# Patient Record
Sex: Female | Born: 1935 | Race: White | Hispanic: No | Smoking: Never smoker
Health system: Southern US, Community
[De-identification: ages and names within clinical notes are randomized; demographics above are authoritative.]

## PROBLEM LIST (undated history)

## (undated) DIAGNOSIS — R600 Localized edema: Secondary | ICD-10-CM

## (undated) DIAGNOSIS — R269 Unspecified abnormalities of gait and mobility: Secondary | ICD-10-CM

## (undated) DIAGNOSIS — I639 Cerebral infarction, unspecified: Secondary | ICD-10-CM

## (undated) DIAGNOSIS — G43909 Migraine, unspecified, not intractable, without status migrainosus: Secondary | ICD-10-CM

## (undated) DIAGNOSIS — M1990 Unspecified osteoarthritis, unspecified site: Secondary | ICD-10-CM

## (undated) DIAGNOSIS — K2270 Barrett's esophagus without dysplasia: Secondary | ICD-10-CM

## (undated) DIAGNOSIS — R197 Diarrhea, unspecified: Secondary | ICD-10-CM

## (undated) DIAGNOSIS — I4891 Unspecified atrial fibrillation: Secondary | ICD-10-CM

## (undated) DIAGNOSIS — R51 Headache: Secondary | ICD-10-CM

## (undated) DIAGNOSIS — J189 Pneumonia, unspecified organism: Secondary | ICD-10-CM

## (undated) DIAGNOSIS — K219 Gastro-esophageal reflux disease without esophagitis: Secondary | ICD-10-CM

## (undated) DIAGNOSIS — I10 Essential (primary) hypertension: Secondary | ICD-10-CM

## (undated) DIAGNOSIS — M8580 Other specified disorders of bone density and structure, unspecified site: Secondary | ICD-10-CM

## (undated) DIAGNOSIS — J42 Unspecified chronic bronchitis: Secondary | ICD-10-CM

## (undated) HISTORY — DX: Essential (primary) hypertension: I10

## (undated) HISTORY — DX: Unspecified abnormalities of gait and mobility: R26.9

## (undated) HISTORY — DX: Unspecified osteoarthritis, unspecified site: M19.90

## (undated) HISTORY — DX: Diarrhea, unspecified: R19.7

## (undated) HISTORY — DX: Cerebral infarction, unspecified: I63.9

## (undated) HISTORY — DX: Unspecified atrial fibrillation: I48.91

## (undated) HISTORY — DX: Localized edema: R60.0

## (undated) HISTORY — PX: OTHER SURGICAL HISTORY: SHX169

## (undated) HISTORY — PX: CHOLECYSTECTOMY: SHX55

## (undated) HISTORY — DX: Barrett's esophagus without dysplasia: K22.70

## (undated) HISTORY — DX: Other specified disorders of bone density and structure, unspecified site: M85.80

---
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
## 1997-09-21 ENCOUNTER — Ambulatory Visit (HOSPITAL_COMMUNITY): Admission: RE | Admit: 1997-09-21 | Discharge: 1997-09-21 | Payer: Self-pay | Admitting: Family Medicine

## 1998-09-23 ENCOUNTER — Ambulatory Visit (HOSPITAL_COMMUNITY): Admission: RE | Admit: 1998-09-23 | Discharge: 1998-09-23 | Payer: Self-pay | Admitting: Family Medicine

## 1999-03-04 ENCOUNTER — Encounter: Payer: Self-pay | Admitting: Gastroenterology

## 1999-03-04 ENCOUNTER — Ambulatory Visit (HOSPITAL_COMMUNITY): Admission: RE | Admit: 1999-03-04 | Discharge: 1999-03-04 | Payer: Self-pay | Admitting: Gastroenterology

## 1999-09-26 ENCOUNTER — Ambulatory Visit (HOSPITAL_COMMUNITY): Admission: RE | Admit: 1999-09-26 | Discharge: 1999-09-26 | Payer: Self-pay | Admitting: Family Medicine

## 2000-09-27 ENCOUNTER — Ambulatory Visit (HOSPITAL_COMMUNITY): Admission: RE | Admit: 2000-09-27 | Discharge: 2000-09-27 | Payer: Self-pay | Admitting: Family Medicine

## 2001-02-08 ENCOUNTER — Inpatient Hospital Stay (HOSPITAL_COMMUNITY): Admission: EM | Admit: 2001-02-08 | Discharge: 2001-02-11 | Payer: Self-pay | Admitting: Emergency Medicine

## 2001-04-09 ENCOUNTER — Ambulatory Visit: Admission: RE | Admit: 2001-04-09 | Discharge: 2001-04-09 | Payer: Self-pay | Admitting: Pulmonary Disease

## 2001-10-01 ENCOUNTER — Encounter: Payer: Self-pay | Admitting: Internal Medicine

## 2001-10-01 ENCOUNTER — Ambulatory Visit (HOSPITAL_COMMUNITY): Admission: RE | Admit: 2001-10-01 | Discharge: 2001-10-01 | Payer: Self-pay | Admitting: Internal Medicine

## 2002-05-23 ENCOUNTER — Encounter: Admission: RE | Admit: 2002-05-23 | Discharge: 2002-05-23 | Payer: Self-pay | Admitting: Internal Medicine

## 2002-05-23 ENCOUNTER — Encounter: Payer: Self-pay | Admitting: Internal Medicine

## 2002-10-07 ENCOUNTER — Ambulatory Visit (HOSPITAL_COMMUNITY): Admission: RE | Admit: 2002-10-07 | Discharge: 2002-10-07 | Payer: Self-pay | Admitting: Internal Medicine

## 2002-10-07 ENCOUNTER — Encounter: Payer: Self-pay | Admitting: Internal Medicine

## 2002-10-09 ENCOUNTER — Encounter: Admission: RE | Admit: 2002-10-09 | Discharge: 2002-10-09 | Payer: Self-pay | Admitting: Family Medicine

## 2002-10-09 ENCOUNTER — Encounter: Payer: Self-pay | Admitting: Family Medicine

## 2003-07-15 ENCOUNTER — Encounter: Admission: RE | Admit: 2003-07-15 | Discharge: 2003-07-15 | Payer: Self-pay | Admitting: Internal Medicine

## 2003-10-08 ENCOUNTER — Ambulatory Visit (HOSPITAL_COMMUNITY): Admission: RE | Admit: 2003-10-08 | Discharge: 2003-10-08 | Payer: Self-pay | Admitting: Internal Medicine

## 2004-07-12 ENCOUNTER — Ambulatory Visit: Payer: Self-pay | Admitting: Internal Medicine

## 2004-08-08 ENCOUNTER — Ambulatory Visit (HOSPITAL_COMMUNITY): Admission: RE | Admit: 2004-08-08 | Discharge: 2004-08-08 | Payer: Self-pay | Admitting: Internal Medicine

## 2004-08-18 ENCOUNTER — Encounter: Admission: RE | Admit: 2004-08-18 | Discharge: 2004-08-18 | Payer: Self-pay | Admitting: Internal Medicine

## 2005-03-22 ENCOUNTER — Ambulatory Visit: Payer: Self-pay | Admitting: Internal Medicine

## 2005-03-24 ENCOUNTER — Ambulatory Visit: Payer: Self-pay | Admitting: Internal Medicine

## 2005-04-11 ENCOUNTER — Ambulatory Visit: Payer: Self-pay | Admitting: Gastroenterology

## 2005-04-17 ENCOUNTER — Ambulatory Visit (HOSPITAL_COMMUNITY): Admission: RE | Admit: 2005-04-17 | Discharge: 2005-04-17 | Payer: Self-pay | Admitting: Gastroenterology

## 2005-05-02 ENCOUNTER — Ambulatory Visit: Payer: Self-pay | Admitting: Gastroenterology

## 2005-05-02 ENCOUNTER — Encounter (INDEPENDENT_AMBULATORY_CARE_PROVIDER_SITE_OTHER): Payer: Self-pay | Admitting: Specialist

## 2005-05-11 ENCOUNTER — Ambulatory Visit (HOSPITAL_COMMUNITY): Admission: RE | Admit: 2005-05-11 | Discharge: 2005-05-11 | Payer: Self-pay | Admitting: Gastroenterology

## 2005-06-15 ENCOUNTER — Ambulatory Visit: Payer: Self-pay | Admitting: Gastroenterology

## 2005-08-25 ENCOUNTER — Ambulatory Visit (HOSPITAL_COMMUNITY): Admission: RE | Admit: 2005-08-25 | Discharge: 2005-08-25 | Payer: Self-pay | Admitting: Internal Medicine

## 2005-12-08 ENCOUNTER — Ambulatory Visit: Payer: Self-pay | Admitting: Internal Medicine

## 2006-08-28 ENCOUNTER — Ambulatory Visit (HOSPITAL_COMMUNITY): Admission: RE | Admit: 2006-08-28 | Discharge: 2006-08-28 | Payer: Self-pay | Admitting: Internal Medicine

## 2006-10-14 ENCOUNTER — Emergency Department (HOSPITAL_COMMUNITY): Admission: EM | Admit: 2006-10-14 | Discharge: 2006-10-14 | Payer: Self-pay | Admitting: Emergency Medicine

## 2006-11-18 ENCOUNTER — Emergency Department (HOSPITAL_COMMUNITY): Admission: EM | Admit: 2006-11-18 | Discharge: 2006-11-18 | Payer: Self-pay | Admitting: Family Medicine

## 2006-11-21 ENCOUNTER — Telehealth (INDEPENDENT_AMBULATORY_CARE_PROVIDER_SITE_OTHER): Payer: Self-pay | Admitting: *Deleted

## 2006-11-23 ENCOUNTER — Telehealth: Payer: Self-pay | Admitting: Internal Medicine

## 2006-11-23 ENCOUNTER — Ambulatory Visit: Payer: Self-pay | Admitting: Internal Medicine

## 2006-11-23 DIAGNOSIS — I10 Essential (primary) hypertension: Secondary | ICD-10-CM | POA: Insufficient documentation

## 2006-12-12 ENCOUNTER — Ambulatory Visit: Payer: Self-pay | Admitting: Internal Medicine

## 2007-01-23 ENCOUNTER — Ambulatory Visit: Payer: Self-pay | Admitting: Internal Medicine

## 2007-01-23 DIAGNOSIS — K2270 Barrett's esophagus without dysplasia: Secondary | ICD-10-CM | POA: Insufficient documentation

## 2007-02-25 ENCOUNTER — Telehealth (INDEPENDENT_AMBULATORY_CARE_PROVIDER_SITE_OTHER): Payer: Self-pay | Admitting: *Deleted

## 2007-03-23 ENCOUNTER — Emergency Department (HOSPITAL_COMMUNITY): Admission: EM | Admit: 2007-03-23 | Discharge: 2007-03-23 | Payer: Self-pay | Admitting: Family Medicine

## 2007-04-26 ENCOUNTER — Ambulatory Visit: Payer: Self-pay | Admitting: Internal Medicine

## 2007-04-26 DIAGNOSIS — R32 Unspecified urinary incontinence: Secondary | ICD-10-CM | POA: Insufficient documentation

## 2007-04-26 DIAGNOSIS — R269 Unspecified abnormalities of gait and mobility: Secondary | ICD-10-CM | POA: Insufficient documentation

## 2007-04-26 DIAGNOSIS — R197 Diarrhea, unspecified: Secondary | ICD-10-CM | POA: Insufficient documentation

## 2007-04-26 DIAGNOSIS — M899 Disorder of bone, unspecified: Secondary | ICD-10-CM | POA: Insufficient documentation

## 2007-04-26 DIAGNOSIS — M949 Disorder of cartilage, unspecified: Secondary | ICD-10-CM

## 2007-05-08 ENCOUNTER — Telehealth: Payer: Self-pay | Admitting: Internal Medicine

## 2007-05-14 ENCOUNTER — Encounter: Payer: Self-pay | Admitting: Internal Medicine

## 2007-05-14 ENCOUNTER — Ambulatory Visit: Payer: Self-pay | Admitting: Internal Medicine

## 2007-06-18 ENCOUNTER — Telehealth (INDEPENDENT_AMBULATORY_CARE_PROVIDER_SITE_OTHER): Payer: Self-pay | Admitting: *Deleted

## 2007-06-27 ENCOUNTER — Ambulatory Visit: Payer: Self-pay | Admitting: Internal Medicine

## 2007-07-30 ENCOUNTER — Inpatient Hospital Stay (HOSPITAL_COMMUNITY): Admission: EM | Admit: 2007-07-30 | Discharge: 2007-08-05 | Payer: Self-pay | Admitting: Emergency Medicine

## 2007-07-30 ENCOUNTER — Ambulatory Visit: Payer: Self-pay | Admitting: Internal Medicine

## 2007-07-30 ENCOUNTER — Ambulatory Visit: Payer: Self-pay | Admitting: Cardiology

## 2007-07-31 ENCOUNTER — Encounter: Payer: Self-pay | Admitting: Internal Medicine

## 2007-08-06 ENCOUNTER — Telehealth: Payer: Self-pay | Admitting: Internal Medicine

## 2007-08-07 ENCOUNTER — Encounter: Payer: Self-pay | Admitting: Internal Medicine

## 2007-08-08 ENCOUNTER — Encounter: Payer: Self-pay | Admitting: Internal Medicine

## 2007-08-13 ENCOUNTER — Ambulatory Visit: Payer: Self-pay | Admitting: Internal Medicine

## 2007-08-13 DIAGNOSIS — I4891 Unspecified atrial fibrillation: Secondary | ICD-10-CM

## 2007-08-13 DIAGNOSIS — R609 Edema, unspecified: Secondary | ICD-10-CM | POA: Insufficient documentation

## 2007-08-14 ENCOUNTER — Ambulatory Visit: Payer: Self-pay | Admitting: Vascular Surgery

## 2007-08-15 ENCOUNTER — Telehealth: Payer: Self-pay | Admitting: Internal Medicine

## 2007-08-16 ENCOUNTER — Encounter: Payer: Self-pay | Admitting: Internal Medicine

## 2007-08-16 ENCOUNTER — Emergency Department (HOSPITAL_COMMUNITY): Admission: EM | Admit: 2007-08-16 | Discharge: 2007-08-16 | Payer: Self-pay | Admitting: Emergency Medicine

## 2007-08-19 ENCOUNTER — Telehealth (INDEPENDENT_AMBULATORY_CARE_PROVIDER_SITE_OTHER): Payer: Self-pay | Admitting: *Deleted

## 2007-08-19 ENCOUNTER — Ambulatory Visit: Payer: Self-pay | Admitting: Internal Medicine

## 2007-08-22 ENCOUNTER — Encounter: Payer: Self-pay | Admitting: Internal Medicine

## 2007-08-22 ENCOUNTER — Telehealth: Payer: Self-pay | Admitting: Internal Medicine

## 2007-08-23 ENCOUNTER — Encounter: Payer: Self-pay | Admitting: Internal Medicine

## 2007-08-27 ENCOUNTER — Encounter: Payer: Self-pay | Admitting: Internal Medicine

## 2007-08-27 ENCOUNTER — Telehealth: Payer: Self-pay | Admitting: Internal Medicine

## 2007-09-09 ENCOUNTER — Telehealth (INDEPENDENT_AMBULATORY_CARE_PROVIDER_SITE_OTHER): Payer: Self-pay | Admitting: *Deleted

## 2007-09-12 ENCOUNTER — Ambulatory Visit: Payer: Self-pay | Admitting: Internal Medicine

## 2007-09-17 ENCOUNTER — Telehealth: Payer: Self-pay | Admitting: Internal Medicine

## 2007-09-17 ENCOUNTER — Telehealth (INDEPENDENT_AMBULATORY_CARE_PROVIDER_SITE_OTHER): Payer: Self-pay | Admitting: *Deleted

## 2007-09-20 ENCOUNTER — Encounter: Payer: Self-pay | Admitting: Internal Medicine

## 2007-09-23 ENCOUNTER — Ambulatory Visit: Payer: Self-pay | Admitting: Internal Medicine

## 2007-10-02 ENCOUNTER — Ambulatory Visit (HOSPITAL_COMMUNITY): Admission: RE | Admit: 2007-10-02 | Discharge: 2007-10-02 | Payer: Self-pay | Admitting: Internal Medicine

## 2007-10-03 ENCOUNTER — Ambulatory Visit: Payer: Self-pay | Admitting: Internal Medicine

## 2007-10-04 ENCOUNTER — Encounter: Payer: Self-pay | Admitting: Internal Medicine

## 2007-10-07 ENCOUNTER — Telehealth: Payer: Self-pay | Admitting: Internal Medicine

## 2007-10-07 ENCOUNTER — Telehealth (INDEPENDENT_AMBULATORY_CARE_PROVIDER_SITE_OTHER): Payer: Self-pay | Admitting: *Deleted

## 2007-10-07 LAB — CONVERTED CEMR LAB
Chloride: 105 meq/L (ref 96–112)
GFR calc non Af Amer: 49 mL/min
Potassium: 3.9 meq/L (ref 3.5–5.1)
Sodium: 140 meq/L (ref 135–145)

## 2007-10-22 ENCOUNTER — Telehealth: Payer: Self-pay | Admitting: Internal Medicine

## 2007-10-28 ENCOUNTER — Ambulatory Visit: Payer: Self-pay | Admitting: Internal Medicine

## 2007-10-28 DIAGNOSIS — A630 Anogenital (venereal) warts: Secondary | ICD-10-CM

## 2007-10-28 DIAGNOSIS — N815 Vaginal enterocele: Secondary | ICD-10-CM

## 2007-10-28 DIAGNOSIS — R1310 Dysphagia, unspecified: Secondary | ICD-10-CM | POA: Insufficient documentation

## 2007-10-29 ENCOUNTER — Encounter: Payer: Self-pay | Admitting: Internal Medicine

## 2007-10-30 ENCOUNTER — Encounter (INDEPENDENT_AMBULATORY_CARE_PROVIDER_SITE_OTHER): Payer: Self-pay | Admitting: *Deleted

## 2007-11-07 ENCOUNTER — Ambulatory Visit (HOSPITAL_COMMUNITY): Admission: RE | Admit: 2007-11-07 | Discharge: 2007-11-07 | Payer: Self-pay | Admitting: Internal Medicine

## 2007-11-07 ENCOUNTER — Encounter: Payer: Self-pay | Admitting: Internal Medicine

## 2007-11-14 ENCOUNTER — Ambulatory Visit: Payer: Self-pay | Admitting: Cardiology

## 2007-11-27 ENCOUNTER — Encounter: Payer: Self-pay | Admitting: Internal Medicine

## 2007-12-02 ENCOUNTER — Ambulatory Visit: Payer: Self-pay | Admitting: Cardiology

## 2008-01-06 ENCOUNTER — Ambulatory Visit: Payer: Self-pay | Admitting: Cardiology

## 2008-01-28 ENCOUNTER — Ambulatory Visit: Payer: Self-pay | Admitting: Internal Medicine

## 2008-01-28 DIAGNOSIS — M1990 Unspecified osteoarthritis, unspecified site: Secondary | ICD-10-CM | POA: Insufficient documentation

## 2008-01-31 ENCOUNTER — Telehealth (INDEPENDENT_AMBULATORY_CARE_PROVIDER_SITE_OTHER): Payer: Self-pay | Admitting: *Deleted

## 2008-01-31 LAB — CONVERTED CEMR LAB
BUN: 16 mg/dL (ref 6–23)
CO2: 33 meq/L — ABNORMAL HIGH (ref 19–32)
Calcium: 9.1 mg/dL (ref 8.4–10.5)
Eosinophils Relative: 3.1 % (ref 0.0–5.0)
GFR calc Af Amer: 49 mL/min
Glucose, Bld: 88 mg/dL (ref 70–99)
HCT: 38.6 % (ref 36.0–46.0)
Hemoglobin: 13.6 g/dL (ref 12.0–15.0)
Monocytes Absolute: 1.1 10*3/uL — ABNORMAL HIGH (ref 0.1–1.0)
Monocytes Relative: 14.7 % — ABNORMAL HIGH (ref 3.0–12.0)
Neutro Abs: 4.8 10*3/uL (ref 1.4–7.7)
RBC: 4.19 M/uL (ref 3.87–5.11)
WBC: 7.7 10*3/uL (ref 4.5–10.5)

## 2008-04-16 ENCOUNTER — Ambulatory Visit: Payer: Self-pay | Admitting: Internal Medicine

## 2008-04-21 ENCOUNTER — Telehealth (INDEPENDENT_AMBULATORY_CARE_PROVIDER_SITE_OTHER): Payer: Self-pay | Admitting: *Deleted

## 2008-04-21 LAB — CONVERTED CEMR LAB
BUN: 21 mg/dL (ref 6–23)
Calcium: 9.1 mg/dL (ref 8.4–10.5)
Cholesterol: 155 mg/dL (ref 0–200)
GFR calc Af Amer: 54 mL/min
GFR calc non Af Amer: 45 mL/min
HDL: 38.6 mg/dL — ABNORMAL LOW (ref >39.0)
LDL Cholesterol: 87 mg/dL (ref 0–99)
Potassium: 4.4 meq/L (ref 3.5–5.1)
Sodium: 143 meq/L (ref 135–145)
VLDL: 29 mg/dL (ref 0–40)

## 2008-04-27 ENCOUNTER — Encounter: Payer: Self-pay | Admitting: Internal Medicine

## 2008-07-27 ENCOUNTER — Ambulatory Visit: Payer: Self-pay | Admitting: Cardiology

## 2008-07-28 ENCOUNTER — Encounter (INDEPENDENT_AMBULATORY_CARE_PROVIDER_SITE_OTHER): Payer: Self-pay | Admitting: *Deleted

## 2008-08-14 ENCOUNTER — Ambulatory Visit: Payer: Self-pay | Admitting: Internal Medicine

## 2008-08-18 ENCOUNTER — Encounter (INDEPENDENT_AMBULATORY_CARE_PROVIDER_SITE_OTHER): Payer: Self-pay | Admitting: *Deleted

## 2008-08-18 LAB — CONVERTED CEMR LAB
Calcium: 9.1 mg/dL (ref 8.4–10.5)
Chloride: 103 meq/L (ref 96–112)
Creatinine, Ser: 1.2 mg/dL (ref 0.4–1.2)
GFR calc non Af Amer: 44.51 mL/min (ref >60)

## 2008-08-19 ENCOUNTER — Ambulatory Visit: Payer: Self-pay | Admitting: Internal Medicine

## 2008-08-26 ENCOUNTER — Telehealth (INDEPENDENT_AMBULATORY_CARE_PROVIDER_SITE_OTHER): Payer: Self-pay | Admitting: *Deleted

## 2008-10-16 ENCOUNTER — Ambulatory Visit (HOSPITAL_COMMUNITY): Admission: RE | Admit: 2008-10-16 | Discharge: 2008-10-16 | Payer: Self-pay | Admitting: Internal Medicine

## 2008-10-19 ENCOUNTER — Telehealth: Payer: Self-pay | Admitting: Internal Medicine

## 2008-10-19 ENCOUNTER — Emergency Department (HOSPITAL_COMMUNITY): Admission: EM | Admit: 2008-10-19 | Discharge: 2008-10-19 | Payer: Self-pay | Admitting: Emergency Medicine

## 2008-10-23 ENCOUNTER — Ambulatory Visit: Payer: Self-pay | Admitting: Internal Medicine

## 2008-10-23 DIAGNOSIS — K625 Hemorrhage of anus and rectum: Secondary | ICD-10-CM

## 2008-10-23 LAB — CONVERTED CEMR LAB: Hemoglobin: 13.2 g/dL

## 2008-10-27 ENCOUNTER — Telehealth: Payer: Self-pay | Admitting: Internal Medicine

## 2008-11-05 ENCOUNTER — Encounter: Payer: Self-pay | Admitting: Internal Medicine

## 2008-11-23 ENCOUNTER — Ambulatory Visit: Payer: Self-pay | Admitting: Cardiology

## 2008-12-04 ENCOUNTER — Telehealth: Payer: Self-pay | Admitting: Cardiology

## 2008-12-23 ENCOUNTER — Ambulatory Visit: Payer: Self-pay

## 2008-12-23 ENCOUNTER — Encounter: Payer: Self-pay | Admitting: Internal Medicine

## 2008-12-23 DIAGNOSIS — H35019 Changes in retinal vascular appearance, unspecified eye: Secondary | ICD-10-CM

## 2009-01-22 ENCOUNTER — Ambulatory Visit: Payer: Self-pay | Admitting: Internal Medicine

## 2009-01-27 ENCOUNTER — Encounter (INDEPENDENT_AMBULATORY_CARE_PROVIDER_SITE_OTHER): Payer: Self-pay | Admitting: *Deleted

## 2009-01-27 LAB — CONVERTED CEMR LAB
Basophils Absolute: 0 10*3/uL (ref 0.0–0.1)
Chloride: 102 meq/L (ref 96–112)
HCT: 41.4 % (ref 36.0–46.0)
Lymphocytes Relative: 22 % (ref 12–46)
Lymphs Abs: 1.8 10*3/uL (ref 0.7–4.0)
Neutro Abs: 5.2 10*3/uL (ref 1.7–7.7)
Neutrophils Relative %: 62 % (ref 43–77)
Platelets: 214 10*3/uL (ref 150–400)
Potassium: 4.9 meq/L (ref 3.5–5.3)
RDW: 15 % (ref 11.5–15.5)
Sodium: 144 meq/L (ref 135–145)
WBC: 8.3 10*3/uL (ref 4.0–10.5)

## 2009-09-22 ENCOUNTER — Emergency Department (HOSPITAL_COMMUNITY): Admission: EM | Admit: 2009-09-22 | Discharge: 2009-09-22 | Payer: Self-pay | Admitting: Emergency Medicine

## 2009-10-13 ENCOUNTER — Ambulatory Visit: Payer: Self-pay | Admitting: Internal Medicine

## 2009-10-13 DIAGNOSIS — H6120 Impacted cerumen, unspecified ear: Secondary | ICD-10-CM | POA: Insufficient documentation

## 2009-10-13 DIAGNOSIS — G43009 Migraine without aura, not intractable, without status migrainosus: Secondary | ICD-10-CM | POA: Insufficient documentation

## 2009-10-22 ENCOUNTER — Ambulatory Visit (HOSPITAL_COMMUNITY): Admission: RE | Admit: 2009-10-22 | Discharge: 2009-10-22 | Payer: Self-pay | Admitting: Internal Medicine

## 2009-11-23 ENCOUNTER — Encounter: Payer: Self-pay | Admitting: Cardiology

## 2009-11-24 ENCOUNTER — Ambulatory Visit: Payer: Self-pay | Admitting: Cardiology

## 2010-04-04 ENCOUNTER — Encounter: Payer: Self-pay | Admitting: Internal Medicine

## 2010-04-05 ENCOUNTER — Encounter: Payer: Self-pay | Admitting: Internal Medicine

## 2010-04-10 ENCOUNTER — Emergency Department (HOSPITAL_COMMUNITY): Admission: EM | Admit: 2010-04-10 | Discharge: 2010-04-10 | Payer: Self-pay | Admitting: Emergency Medicine

## 2010-04-15 ENCOUNTER — Telehealth: Payer: Self-pay | Admitting: Internal Medicine

## 2010-04-15 DIAGNOSIS — H532 Diplopia: Secondary | ICD-10-CM

## 2010-04-26 ENCOUNTER — Encounter: Payer: Self-pay | Admitting: Internal Medicine

## 2010-04-29 ENCOUNTER — Telehealth: Payer: Self-pay | Admitting: Internal Medicine

## 2010-05-06 ENCOUNTER — Ambulatory Visit: Payer: Self-pay | Admitting: Internal Medicine

## 2010-05-09 ENCOUNTER — Encounter
Admission: RE | Admit: 2010-05-09 | Discharge: 2010-05-09 | Payer: Self-pay | Source: Home / Self Care | Attending: Neurology | Admitting: Neurology

## 2010-06-07 ENCOUNTER — Encounter: Payer: Self-pay | Admitting: Internal Medicine

## 2010-06-19 ENCOUNTER — Encounter: Payer: Self-pay | Admitting: Internal Medicine

## 2010-06-28 NOTE — Assessment & Plan Note (Signed)
Summary: 427.31  1 yr rov  pfh, rn      Allergies Added:   Visit Type:  Follow-up Primary Provider:  Nolon Rod. Paz MD  CC:  Atrial Fibrillation.  History of Present Illness: The patient returns for follow up of her atrial fibrillation.  Since last being seen she has had no new complaints. She is only taking one metoprolol each night. She does feel some tachycardia palpitations at night but otherwise has no complaints. These go away quickly. She otherwise has no presyncope or syncope. She has no chest pain or shortness of breath.  Current Medications (verified): 1)  Calcium 2)  Cardizem 90 Mg  Tabs (Diltiazem Hcl) .... Tid 3)  Metoprolol Tartrate 25 Mg  Tabs (Metoprolol Tartrate) .... 2 By Mouth At Night 4)  Furosemide 20 Mg  Tabs (Furosemide) .Marland Kitchen.. 1 By Mouth Once Daily 5)  Ed K+10 10 Meq  Tbcr (Potassium Chloride) .Marland Kitchen.. 1 By Mouth Once Daily 6)  Digoxin 0.125 Mg Tabs (Digoxin) .Marland Kitchen.. 1 By Mouth Once Daily 7)  Fiber 625 Mg Tabs (Calcium Polycarbophil) .... Daily 8)  Aspirin 325 Mg  Tabs (Aspirin) .... Daily  Allergies (verified): 1)  ! Codeine  Past History:  Past Medical History: Reviewed history from 10/13/2009 and no changes required. Hypertension Osteopenia ATRIAL FIBRILLATION, PAROXYSMAL  GAIT IMBALANCE   URINARY INCONTINENCE  DIARRHEA, CHRONIC  BARRETT'S ESOPHAGUS  Osteoarthritis  Past Surgical History: Reviewed history from 11/17/2008 and no changes required. Cholecystectomy (had pancreatitis) Hysterectomy  Review of Systems       As stated in the HPI and negative for all other systems.   Vital Signs:  Patient profile:   77 year old female Height:      64 inches Weight:      143 pounds BMI:     24.63 Pulse rate:   75 / minute Resp:     16 per minute BP sitting:   126 / 80  (right arm)  Vitals Entered By: Marrion Coy, CNA (November 24, 2009 8:38 AM)  Physical Exam  General:  Well developed, well nourished, in no acute distress. Head:  normocephalic and  atraumatic Neck:  Neck supple, no JVD. No masses, thyromegaly or abnormal cervical nodes. Chest Wall:  no deformities or breast masses noted Lungs:  Clear bilaterally to auscultation and percussion. Heart:  S1 and S2 within normal limits, no S3, no S4, no clicks, no rubs, irregular Abdomen:  Bowel sounds positive; abdomen soft and non-tender without masses, organomegaly, or hernias noted. No hepatosplenomegaly. Msk:  Normal muscle wasting for a Extremities:  No clubbing or cyanosis. Neurologic:  Alert and oriented x 3. Cervical Nodes:  no significant adenopathy Psych:  Normal affect.   EKG  Procedure date:  11/24/2009  Findings:      Atrial fibrillation, premature ventricular contractions, incomplete right bundle branch block  Impression & Recommendations:  Problem # 1:  HYPERTENSION (ICD-401.9) Her blood pressures controlled. She'll continue on the meds as listed.  Problem # 2:  ATRIAL FIBRILLATION, PAROXYSMAL (ICD-427.31) She has reasonable rate control. Because of fall risk and previous GI bleeding she is not a Coumadin candidate. No change in therapy or further evaluation is necessary.  Patient Instructions: 1)  Your physician recommends that you schedule a follow-up appointment in: 1 year 2)  Your physician recommends that you continue on your current medications as directed. Please refer to the Current Medication list given to you today.

## 2010-06-28 NOTE — Assessment & Plan Note (Signed)
Summary: ear wax/cbs   Vital Signs:  Patient profile:   77 year old female Height:      64 inches Weight:      141.2 pounds BMI:     24.32 Pulse rate:   80 / minute BP sitting:   124 / 80  Vitals Entered By: Shary Decamp (Oct 13, 2009 1:35 PM) CC: ears feel "funny" when she swallows   History of Present Illness: ear congestion bilaterally, feels is wax   She was also at the ER recently with severe headache and nausea In the past she had migraine headaches, she has not had headaches for years except for the last episode. She received several meds for pain and nausea while at the ER and she is feeling well since  ER chart reviewed, she went there April 27: CT of the head without contrast nonacute Sedimentation rate 30 AST 45, ALT 41 CBC normal Creatinine 1.2  Current Medications (verified): 1)  Calcium 2)  Cardizem 90 Mg  Tabs (Diltiazem Hcl) .... Tid 3)  Metoprolol Tartrate 25 Mg  Tabs (Metoprolol Tartrate) .... 2 By Mouth At Night 4)  Furosemide 20 Mg  Tabs (Furosemide) .Marland Kitchen.. 1 By Mouth Once Daily 5)  Ed K+10 10 Meq  Tbcr (Potassium Chloride) .Marland Kitchen.. 1 By Mouth Once Daily 6)  Digoxin 0.125 Mg Tabs (Digoxin) .Marland Kitchen.. 1 By Mouth Once Daily 7)  Fiber 625 Mg Tabs (Calcium Polycarbophil) .... Daily 8)  Aspirin 325 Mg  Tabs (Aspirin) .... Daily  Allergies (verified): 1)  ! Codeine  Past History:  Past Medical History: Hypertension Osteopenia ATRIAL FIBRILLATION, PAROXYSMAL  GAIT IMBALANCE   URINARY INCONTINENCE  DIARRHEA, CHRONIC  BARRETT'S ESOPHAGUS  Osteoarthritis  Past Surgical History: Reviewed history from 11/17/2008 and no changes required. Cholecystectomy (had pancreatitis) Hysterectomy  Review of Systems       denies any difficulty hearing No ear discharge  Physical Exam  General:  alert and well-developed.   Ears:  L ear normal.  right ear with abundant dry wax Lungs:  normal respiratory effort, no intercostal retractions, no accessory muscle use, and  normal breath sounds.   Heart:  irregularly irregular Extremities:  trace pitting edema up to the knees  Neurologic:  alert & oriented X3, cranial nerves II-XII intact, and strength normal in all extremities.  face symmetric   Impression & Recommendations:  Problem # 1:  CERUMEN IMPACTION (ICD-380.4)  I personally removed a  large  amount of dry wax from the right ear with a forcep  Orders: Cerumen Impaction Removal (69678)  Problem # 2:  MIGRAINE, COMMON (ICD-346.10) Assessment: New went to the ER April 27 with severe headache and nausea that resembles her previous but remote episodes of migraines Workup negative Symptom-free since the ER visit plan: Observation Her updated medication list for this problem includes:    Metoprolol Tartrate 25 Mg Tabs (Metoprolol tartrate) .Marland Kitchen... 2 by mouth at night    Aspirin 325 Mg Tabs (Aspirin) .Marland Kitchen... Daily  Complete Medication List: 1)  Calcium  2)  Cardizem 90 Mg Tabs (Diltiazem hcl) .... Tid 3)  Metoprolol Tartrate 25 Mg Tabs (Metoprolol tartrate) .... 2 by mouth at night 4)  Furosemide 20 Mg Tabs (Furosemide) .Marland Kitchen.. 1 by mouth once daily 5)  Ed K+10 10 Meq Tbcr (Potassium chloride) .Marland Kitchen.. 1 by mouth once daily 6)  Digoxin 0.125 Mg Tabs (Digoxin) .Marland Kitchen.. 1 by mouth once daily 7)  Fiber 625 Mg Tabs (Calcium polycarbophil) .... Daily 8)  Aspirin 325 Mg Tabs (  Aspirin) .... Daily  Patient Instructions: 1)  Please schedule a follow-up appointment in 4 months .

## 2010-06-28 NOTE — Miscellaneous (Signed)
  Clinical Lists Changes  Observations: Added new observation of US CAROTID: Mild, hard plaque, bilaterally 0-39% Bilateral ICA stenosis (12/23/2008 10:04)      Carotid Doppler  Procedure date:  12/23/2008  Findings:      Mild, hard plaque, bilaterally 0-39% Bilateral ICA stenosis

## 2010-06-28 NOTE — Letter (Signed)
Summary: [facility]   Imported By: Lanelle Bal 04/18/2010 11:28:28  _____________________________________________________________________  External Attachment:    Type:   Image     Comment:   External Document

## 2010-06-28 NOTE — Progress Notes (Signed)
Summary: Call from Dr.Groat's office  Phone Note From Other Clinic Call back at [phone]   Caller: [facility] Morrie Sheldon Summary of Call: Dr.Groat questions if patient had a mini stroke and would like to know if Dr.Paz will schedule patient with Neurologist or if he would have a different way of addressing that.  I informed Morrie Sheldon Dr.Paz is out of the office and if she could fax over office note for Dr.Hopper to review and he will address. I informed Morrie Sheldon I will call her back to let her know Dr.Hopper's response    Initial call taken by: Shonna Chock CMA,  April 15, 2010 8:18 AM  Follow-up for Phone Call        Diplopia X 2 days; Dr Dione Booze called requesting Neuro consult Follow-up by: Marga Melnick MD,  April 15, 2010 10:00 AM  Additional Follow-up for Phone Call Additional follow up Details #1::        REFERRAL & GROAT'S NOTE SENT TO [facility] FOR "ASAP" APPT, I WILL INFORM PT W/APPT INFO WHEN KNOWN.  Additional Follow-up by: Magdalen Spatz [facility],  April 15, 2010 11:22 AM  New Problems: DIPLOPIA (ICD-368.2)   New Problems: DIPLOPIA (ICD-368.2)

## 2010-06-28 NOTE — Progress Notes (Signed)
----   Converted from flag ---- ---- 04/29/2010 9:25 AM, Army Fossa CMA wrote:  . I spoke with the pt. She saw Neuro and the told her to see her PCP , may need a MRI Pt has an appt with you next week.   ---------------------------------------------------------------------------------- will get records  Marcellus E. Paz MD  April 30, 2010 10:37 AM   Appended Document:  left message w/ medical records to fax over records.

## 2010-06-30 NOTE — Letter (Signed)
Summary: [facility]  [facility]   Imported By: Lanelle Bal 05/13/2010 14:12:16  _____________________________________________________________________  External Attachment:    Type:   Image     Comment:   External Document

## 2010-06-30 NOTE — Letter (Signed)
Summary: diplopia, no clear dx, no further w/u  rec.  -- Neurologic    [facility]   Imported By: Lanelle Bal 06/17/2010 08:30:47  _____________________________________________________________________  External Attachment:    Type:   Image     Comment:   External Document

## 2010-06-30 NOTE — Assessment & Plan Note (Signed)
Summary: follow up from neuro/drb   Vital Signs:  Patient profile:   77 year old female Weight:      136 pounds Pulse rate:   79 / minute Pulse rhythm:   regular BP sitting:   122 / 80  (left arm) Cuff size:   regular  Vitals Entered By: Army Fossa CMA (May 06, 2010 1:57 PM) CC: Pt here to f/u from Neuro. Comments Has an MRI scheduled on Monday Rx [facility]    History of Present Illness: patient recently developed low vision. saw  ophthalmology and  subsequently  neurology Neurology note is reviewed from 11-29: Differential diagnoses included myasthenia gravis, brainstem stroke. Blood work: CBC normal Labs were myasthenia gravis negative Creatinine 1.2 LFTs negative Lyme disease -- see  actual results TSH normal ACE normal ANA negative B12 524 C-Reactive protein 14, elevated  Today is here w/ Corrie Dandy, her daughter reports that symptoms are about the same , still has diplopia usually when she starts reading, she is not driving her car as recommended by ophthalmology  Current Medications (verified): 1)  Calcium 2)  Cardizem 90 Mg  Tabs (Diltiazem Hcl) .... Tid 3)  Metoprolol Tartrate 25 Mg  Tabs (Metoprolol Tartrate) .... 2 By Mouth At Night 4)  Furosemide 20 Mg  Tabs (Furosemide) .Marland Kitchen.. 1 By Mouth Once Daily 5)  Ed K+10 10 Meq  Tbcr (Potassium Chloride) .Marland Kitchen.. 1 By Mouth Once Daily 6)  Digoxin 0.125 Mg Tabs (Digoxin) .Marland Kitchen.. 1 By Mouth Once Daily 7)  Fiber 625 Mg Tabs (Calcium Polycarbophil) .... Daily 8)  Aspirin 325 Mg  Tabs (Aspirin) .... Daily  Allergies (verified): 1)  ! Codeine  Past History:  Past Medical History: Hypertension Osteopenia ATRIAL FIBRILLATION, PAROXYSMAL , not a coumadin candidate  GAIT IMBALANCE   URINARY INCONTINENCE  DIARRHEA, CHRONIC  BARRETT'S ESOPHAGUS  Osteoarthritis  Past Surgical History: Reviewed history from 11/17/2008 and no changes required. Cholecystectomy (had pancreatitis) Hysterectomy  Social  History: widow lives by self 2 daughter, in [place]; [place] and [place] older one is POA lost 2 children drives ADL: independent daughter lives in [place]  Review of Systems       no fevers No cough, sputum production CV:  Denies chest pain or discomfort and swelling of feet;   palpitations at night (most nights, no associated symptoms). GU:  Denies dysuria and hematuria. Neuro:  Denies headaches; dizzines--no no slurred speach no motor deficits .  Physical Exam  General:  alert, well-developed, and well-nourished.   Lungs:  normal respiratory effort, no intercostal retractions, no accessory muscle use, and normal breath sounds.   Heart:  irregularly irregular Extremities:  trace pitting edema up to the knees  Neurologic:  alert & oriented X3, face symmetric, external ocular  movements intact, pupils equal and nonreactive (had cataract surgery B). Speech is fluent and coherent. Memory is normal   Impression & Recommendations:  Problem # 1:  DIPLOPIA (ICD-368.2) Assessment New records reviewed Status post eval. by ophthalmology and neurology DDX per neurology includes a stroke versus myasthenia gravis. initial workup negative except for elevated C-reactive protein. review of systems negative for possible infection, white count is  normal. plan: observation, MRI pending  Problem # 2:  * END OF LIFE ISSUES patient is somehow depressed because she was rec. not to  drive She was extensively counseled. I advised her  daughter and the pt , to make plans for when  she is unable to take care of herself  They are considering her  moving to her daughter's or somebody to  move in with her. states that  her older daughter has the POA She also has a living will  Problem # 3:  F2F > 25 min d/t chart review and #2  Complete Medication List: 1)  Calcium  2)  Cardizem 90 Mg Tabs (Diltiazem hcl) .... Tid 3)  Metoprolol Tartrate 25 Mg Tabs (Metoprolol tartrate) .... 2 by mouth at night 4)   Furosemide 20 Mg Tabs (Furosemide) .Marland Kitchen.. 1 by mouth once daily 5)  Ed K+10 10 Meq Tbcr (Potassium chloride) .Marland Kitchen.. 1 by mouth once daily 6)  Digoxin 0.125 Mg Tabs (Digoxin) .Marland Kitchen.. 1 by mouth once daily 7)  Fiber 625 Mg Tabs (Calcium polycarbophil) .... Daily 8)  Aspirin 325 Mg Tabs (Aspirin) .... Daily  Other Orders: Flu Vaccine 51yrs + MEDICARE PATIENTS (U9323) Administration Flu vaccine - MCR (F5732)  Patient Instructions: 1)  Please schedule a follow-up appointment in 3 months .    Orders Added: 1)  Flu Vaccine 49yrs + MEDICARE PATIENTS [Q2039] 2)  Administration Flu vaccine - MCR [G0008] 3)  Est. Patient Level IV [20254] Flu Vaccine Consent Questions     Do you have a history of severe allergic reactions to this vaccine? no    Any prior history of allergic reactions to egg and/or gelatin? no    Do you have a sensitivity to the preservative Thimersol? no    Do you have a past history of Guillan-Barre Syndrome? no    Do you currently have an acute febrile illness? no    Have you ever had a severe reaction to latex? no    Vaccine information given and explained to patient? yes    Are you currently pregnant? no    Lot Number:[id number]   Exp Date:11/26/2010   Site Given  Left Deltoid IMine 27yrs + MEDICARE PATIENTS [Q2039] 2)  Administration Flu vaccine - MCR [G0008]      [email or web address]

## 2010-08-16 LAB — COMPREHENSIVE METABOLIC PANEL
AST: 45 U/L — ABNORMAL HIGH (ref 0–37)
Albumin: 3.9 g/dL (ref 3.5–5.2)
Alkaline Phosphatase: 64 U/L (ref 39–117)
BUN: 18 mg/dL (ref 6–23)
Chloride: 105 mEq/L (ref 96–112)
Potassium: 3.9 mEq/L (ref 3.5–5.1)
Total Bilirubin: 0.4 mg/dL (ref 0.3–1.2)

## 2010-08-16 LAB — DIFFERENTIAL
Basophils Absolute: 0 10*3/uL (ref 0.0–0.1)
Basophils Relative: 0 % (ref 0–1)
Eosinophils Absolute: 0.1 10*3/uL (ref 0.0–0.7)
Eosinophils Relative: 2 % (ref 0–5)
Monocytes Absolute: 0.6 10*3/uL (ref 0.1–1.0)
Neutro Abs: 6.3 10*3/uL (ref 1.7–7.7)

## 2010-08-16 LAB — CBC
MCHC: 33.5 g/dL (ref 30.0–36.0)
MCV: 97.7 fL (ref 78.0–100.0)
Platelets: 147 10*3/uL — ABNORMAL LOW (ref 150–400)

## 2010-09-06 LAB — DIGOXIN LEVEL: Digoxin Level: 1.1 ng/mL (ref 0.8–2.0)

## 2010-09-06 LAB — POCT I-STAT, CHEM 8
BUN: 23 mg/dL (ref 6–23)
Chloride: 104 mEq/L (ref 96–112)
Potassium: 3.8 mEq/L (ref 3.5–5.1)
Sodium: 142 mEq/L (ref 135–145)

## 2010-09-06 LAB — SEDIMENTATION RATE: Sed Rate: 35 mm/hr — ABNORMAL HIGH (ref 0–22)

## 2010-10-03 ENCOUNTER — Other Ambulatory Visit: Payer: Self-pay | Admitting: Internal Medicine

## 2010-10-03 DIAGNOSIS — Z1231 Encounter for screening mammogram for malignant neoplasm of breast: Secondary | ICD-10-CM

## 2010-10-11 NOTE — Assessment & Plan Note (Signed)
[facility]                            CARDIOLOGY OFFICE NOTE   NAME:Sylvia Duncan, Sylvia Duncan                     MRN:          [id number]  DATE:01/06/2008                            DOB:          08/09/1912    PRIMARY CARE PHYSICIAN:  Willow Ora, MD.   REASON FOR PRESENTATION:  Evaluate the patient with atrial fibrillation.   HISTORY OF PRESENT ILLNESS:  The patient presents for followup of the  above.  When I last saw her, I had her wear a Holter monitor.  This  demonstrated atrial fibrillation with a reasonable rate.  She had  occasional PVCs.  There were pauses less than 2.5 seconds.  There was no  sustained bradyarrhythmias or tachy rates.  The average was 68.   She does not want Coumadin.  She does occasionally feel breathless at  night when she gets up to go to the bathroom.  She gets back into bed.  She says she feels her heart quivering.  She takes several deep  breaths and recovers.  She does not describe any PND or orthopnea.  The  patient does not have any presyncope or syncope.  She has had no chest  pain.   PAST MEDICAL HISTORY:  Atrial fibrillation, hypertension, pneumonia  secondary apparently to reflux with Zenker diverticulum, glaucoma,  pancreatitis, hysterectomy, hip replacement, cholecystectomy, and foot  surgery.   ALLERGIES:  None.   MEDICATIONS:  1. Aspirin 325 mg daily.  2. Potassium 10 mEq daily.  3. Calcium.  4. Fiber.  5. Lasix 20 mg daily.  6. Diltiazem 90 mg t.i.d.  7. Digoxin 0.125 mg daily.  8. Toprol 50 mg b.i.d.   REVIEW OF SYSTEMS:  As stated in the HPI and otherwise negative for  other systems.   PHYSICAL EXAMINATION:  GENERAL:  The patient is in no distress.  VITAL SIGNS:  Blood pressure 130/80, heart rate 52 and irregular, and  weight 159 pounds.  NECK:  No jugular venous distention at 45 degrees.  Carotid upstroke  brisk and symmetric, no bruits, and no thyromegaly.  LYMPHATICS:  No cervical, axillary, or  inguinal adenopathy.  LUNGS:  Clear to auscultation bilaterally.  BACK:  No costovertebral angle tenderness.  CHEST:  Unremarkable.  HEART:  PMI not displaced or sustained, S1 an S2 within normal.  No S3,  no murmurs.  ABDOMEN:  Flat, positive bowel sounds.  Normal in frequency and pitch,  no bruits, no rebound, no guarding or midline pulsatile mass.  No  organomegaly.  SKIN:  No rashes, no nodules.  EXTREMITIES:  2+ pulses, no edema.   ASSESSMENT AND PLAN:  1. Atrial fibrillation.  The patient seems to have a reasonable rate      control.  If she has any problems in the future, we could back off      on her diltiazem.  For now, I think this is a reasonable regimen.      I would like to check a digital level, but she took at this      morning, so I have to wait.  We will have  a come back for this.  We      discussed the Coumadin again.  I have suggested this and she does      not want to be on this.  2. Chest discomfort.  She is not describing any further chest      discomfort.  She has some episodes of breathlessness when she gets      back into bed after going to the bathroom at night.  She had this      when she got up on the table.  She was not in any distress.  Her      heart rate actually was not particularly accelerated.  She will      manage this conservatively with deep breathing.  3. Followup.  I will see the patient again in about 6 months or sooner      if needed.     Rollene Rotunda, MD, [facility]  Electronically Signed    JH/MedQ  DD: 01/06/2008  DT: 01/07/2008  Job #: [id number]   cc:   Willow Ora, MD

## 2010-10-11 NOTE — Assessment & Plan Note (Signed)
[facility]                            CARDIOLOGY OFFICE NOTE   NAME:Brucks, PEARSON REASONS                     MRN:          [id number]  DATE:07/27/2008                            DOB:          [birth year]    PRIMARY CARE PHYSICIAN:  Willow Ora, MD   REASON FOR PRESENTATION:  Evaluate the patient with atrial fibrillation.   HISTORY OF PRESENT ILLNESS:  The patient presents for followup of the  above.  This is a 46-month followup.  Since I last saw her, she has not  really felt much in a way of palpitations.  She has had no presyncope or  syncope.  However, she does feel very fatigued.  She feels I am drugged  at night.   PAST MEDICAL HISTORY:  1. Atrial fibrillation.  2. Hypertension.  3. Pneumonia apparently to reflux with Zenker diverticulum.  4. Glaucoma.  5. Pancreatitis.  6. Hysterectomy.  7. Hip replacement.  8. Cholecystectomy.  9. Foot surgery.   ALLERGIES:  None.   MEDICATIONS:  1. Aspirin 325 mg daily.  2. Potassium 10 mEq daily.  3. Calcium.  4. Lasix 20 mg daily.  5. Diltiazem 90 mg t.i.d.  6. Digoxin 0.125 mg daily.  7. Metoprolol 25 mg t.i.d.   REVIEW OF SYSTEMS:  As stated in the HPI, and otherwise negative for  other systems.   PHYSICAL EXAMINATION:  GENERAL:  The patient is in no distress.  VITAL SIGNS:  Blood pressure 118/64 and heart rate 47 and irregular.  HEENT:  Eyelids unremarkable; pupils equal, round, and reactive to  light; fundi not visualized, oral mucosa unremarkable.  NECK:  No jugular venous distention at 45 degrees; carotid upstroke  brisk and symmetric; no bruits, no thyromegaly.  LYMPHATICS:  No adenopathy.  LUNGS:  Clear to auscultation bilaterally.  BACK:  No costovertebral angle tenderness.  CHEST:  Unremarkable.  HEART:  PMI not displaced or sustained; S1 and S2 within normal limits.  No S3.  No murmurs.  ABDOMEN:  Flat; positive bowel sounds; normal in frequency and pitch; no  bruits, no rebound,  no guarding; no midline pulsatile mass; no  organomegaly.  SKIN:  No rashes.  No nodules.  EXTREMITIES:  Pulses 2+, no edema.   EKG; atrial fibrillation with slow ventricular response, nonspecific T  wave changes.   ASSESSMENT AND PLAN:  1. Atrial fibrillation.  The patient is going very slow.  She is very      fatigued.  At this point, I am going to discontinue the metoprolol      altogether. I might give a bit of digoxin going forward.  She is      going to remain on the Cardizem.  If she has any tachy      palpitations, she will let me know.  We will have to watch her      blood pressure going forward, though this does not seem like it has      been high recently.  2. Hypertension as above.  This has not been particularly higher      problematic and so,  I think she will be fine just simply      discontinuing the metoprolol.  3. Followup.  I would like to see her back in about 3 months to see if      I want to change her meds further.  At her age, I would be looking      to back off on drugs as I can.     Rollene Rotunda, MD, [facility]  Electronically Signed    JH/MedQ  DD: 07/27/2008  DT: 07/28/2008  Job #: [id number]   cc:   Willow Ora, MD

## 2010-10-11 NOTE — Discharge Summary (Signed)
Sylvia Duncan, Sylvia Duncan              ACCOUNT NO.:  [id number]   MEDICAL RECORD NO.:  [id number]          PATIENT TYPE:  INP   LOCATION:  1422                         FACILITY:  [facility]   PHYSICIAN:  Rosalyn Gess. Norins, MD  DATE OF BIRTH:  28-Feb-1913   DATE OF ADMISSION:  07/30/2007  DATE OF DISCHARGE:  08/05/2007                               DISCHARGE SUMMARY   DISCHARGE DIAGNOSES:  1. Atrial fibrillation with increased ventricular rate.  2. Pneumonia.  3. Hypokalemia.  4. Hypertension.  5. Transient hyperglycemia.   CONSULTATIONS:  None.   PROCEDURE:  1. Chest x-ray done on admission which showed moderate bilateral      effusions with patchy bilateral air space disease more pronounced      in the lung bases with cardiomegaly.  2. Chest x-ray follow-up that was done on March 5, showed mild      improvement in large bilateral pleural effusions with improved      interstitial edema pattern.  3. Two-dimensional echocardiogram was done on March 4 which showed      normal left ventricular systolic ejection fraction of 04-54%. There      was mild aortic valvular regurgitation, mild mitral annular      calcification.  The left atrium was moderately dilated.  The right      ventricle was mildly dilated with decreased right ventricular      systolic function.  The peak pulmonary artery systolic pressure was      mildly increased.  Right atrium was mildly dilated and there was      some left pleural effusion.   HISTORY OF PRESENT ILLNESS:  The patient is a 77 year old elderly lady  who presented to the ER on March 3, with complaints of one-week history  of weakness, shortness of breath, some tingling in the chest.  She was  seen by her primary care Sylvia Duncan a week before and treated for upper  respiratory tract infection with amoxicillin.  At the time of admission  she said she felt better after the antibiotic but then started feeling  worse.  She also had a history of being noncompliant  with her  medication.  At the time of admission, the patient complains of  nonproductive cough with some tingling in her chest radiating up to the  neck.  There was bilateral lower extremity swelling and generalized  weakness.  She denied any chest pain, no headache, no dizziness, no  weakness in any part of the body.  On examination, she was in atrial  fibrillation with increased ventricular rate from 96 to 130.  Her blood  pressure was stable at 137/94.  She was afebrile at 97.5.  Her O2  saturation was 95% on room air.   PHYSICAL EXAMINATION:  She had some minimal bibasilar crackles.  JVD was  not elevated on neck examination.  CARDIOVASCULAR:  Rate and rhythm was irregular.  ABDOMEN:  Soft.  EXTREMITIES:  She had mild lower extremity edema.   LABORATORY DATA:  Initial labs on admission showed elevated white blood  cell count of 10.5 with left shift.  Hemoglobin  was 11.8.  Chemistries  were normal only for high glucose of 156.  Her cardiac enzymes done at  the point of care were negative x1.   EKG showed heart rate of 144 with atrial fibrillation.  Chest x-ray  showed moderate bilateral effusions with patchy air space disease more  pronounced at the bases.   HOSPITAL COURSE:  She was admitted for right upper lobe pneumonia with  effusions.  She was started on Rocephin and Zithromax.  For her atrial  fibrillation with increased ventricular rate she was started on Lovenox  for anticoagulation along with aspirin.  She was started also on  Cardizem drip to control her rate below 100.  Two-dimensional  echocardiogram was obtained as well as thyroid panel.   The patient improved on admission.  Her heart rate got controlled with  IV Cardizem which was changed to orally.  Initially she was changed to  60 mg p.o. q.8 hours, but later restarted up to 90 mg p.o. q.8 hours.  She was also on Lopressor 25 mg p.o. b.i.d. for heart rate control.  She  was continued on aspirin and Lovenox for  anticoagulation.  Two-  dimensional echocardiogram was obtained the results of which were as  dictated before.  She had a normal ejection fraction with moderately  dilated atrium.  She improved with IV antibiotics on pneumonia.  For  that she was changed to p.o. Ceftin and p.o. Zithromax.  She has already  completed the seven days of antibiotics at the time of discharge.  At  the time of discharge, the patient is feeling better, she is afebrile.  Her temperature is 98.4, heart rate 76, respirations 20, blood pressure  109/70, O2 saturations she is still on 2 liters of oxygen at 92%.  We  will check that on room air prior to her discharge and if she is above  92% on room air then we will discharge her home.   DISCHARGE LABORATORY DATA:  White blood cell count 6.5 with hemoglobin  of 11.6.  Chemistries are all within normal limits.  Glucose is 135.   DISCHARGE MEDICATIONS:  1. Cardizem tablet 90 mg one tablet p.o. t.i.d.  2. Lopressor 25 mg one tablet p.o. b.i.d.  3. Tessalon Perles 100 mg one tablet p.o. t.i.d. p.r.n. for cough.  4. Lasix 40 mg p.o. daily.  5. Potassium 20 mEq tablets one tablet p.o. daily.  6. Aspirin 325 mg p.o. daily.  7. Detrol LA 1 mg p.o. q.h.s.  8. Calcium tablet over-the-counter one tablet p.o. daily.  9. Fiber tablets one tablet p.o. daily.   DIET:  She should follow a healthy heart diet.   FOLLOWUP:  She has follow-up scheduled with her primary care physician,  Sylvia Duncan, M.D., on March 17 at 11 a.m.  She should return to ER if she  develops severe shortness of breath, severe chest pain, or palpitations.      Sylvia Scrape, MD  Electronically Signed     ______________________________  Rosalyn Gess. Norins, MD    VPC/MEDQ  D:  08/05/2007  T:  08/05/2007  Job:  [id number]   cc:   Rosalyn Gess. Norins, MD  [address]  [place]  [place] [address]   Sylvia Ora, MD  [phone] [place], [place] [address]

## 2010-10-11 NOTE — Procedures (Signed)
DUPLEX DEEP VENOUS EXAM - LOWER EXTREMITY   INDICATION:  Left leg edema.   HISTORY:  Edema:  One day of left leg edema.  Trauma/Surgery:  No.  Pain:  No.  PE:  No.  Previous DVT:  No.  Anticoagulants:  No.  Other:  No.   DUPLEX EXAM:                CFV   SFV   PopV   PTV   GSV                R  L  R  L  R   L  R  L  R  L  Thrombosis    O  o     o      o     o     o  Spontaneous   +  +     +      +     +     +  Phasic        O  O     O      O     O     O  Augmentation  +  +     +      +     +     +  Compressible  +  +     +      +     +     +  Competent     O  O     O      O     O     O   Legend:  + - yes  o - no  p - partial  D - decreased   IMPRESSION:  1. No evidence of left leg deep venous thrombosis, superficial vein      thrombosis, or baker's cyst.  2. The venous Doppler signal is pulsatile throughout the legs      bilaterally.    _____________________________  Larina Earthly, M.D.   MC/MEDQ  D:  08/14/2007  T:  08/14/2007  Job:  [id number]

## 2010-10-11 NOTE — Assessment & Plan Note (Signed)
[facility]                            CARDIOLOGY OFFICE NOTE   NAME:Sylvia Duncan, Sylvia Duncan                     MRN:          [id number]  DATE:11/14/2007                            DOB:          [birth year]    REASON FOR PRESENTATION:  Evaluate the patient with atrial fibrillation.   HISTORY OF PRESENT ILLNESS:  The patient is a lovely 77 year old white  female who had some arrhythmias in 2002 and had a workup to include a  Cardiolite, which was negative.  She had an echocardiogram with some  mild aortic insufficiency.  She was hospitalized in early March with  pneumonia.  She was found to be in atrial fibrillation at that time.  An  echocardiogram done then demonstrated an EF of 55-60% with mild AI.  The  left atrium was moderately dilated.  The patient was treated for her  pneumonia and sent home with beta blocker, Cardizem, and aspirin.  She  presents for followup.   She says that she does notice her heart at night.  She has sometimes a  heavy feeling in her chest.  She feels her heart quiver.  She may put  on the oxygen that she has at home since her pneumonia.  She will rarely  have this happen during the day.  She does not describe any presyncope  or syncope.  She has episodes of weakness.  She has good days and bad  days.  On her good days, she probably tends to overdo by doing quite a  bit of household activities.  She does not describe any neck or arm  discomfort.  She does not describe any PND.  She sleeps on 2 thin  pillows.   PAST MEDICAL HISTORY:  1. Recent pneumonia.  2. Zenker diverticulum.  3. Glaucoma.  4. Pancreatitis.   PAST SURGICAL HISTORY:  1. Hysterectomy.  2. Hip replacement.  3. Cholecystectomy.  4. Foot surgery.   ALLERGIES:  None.   MEDICATIONS:  1. Lasix 20 mg daily.  2. Potassium 10 mg day.  3. Metoprolol 50 mg b.i.d.  4. Diltiazem 90 mg t.i.d.  5. Calcium.  6. Aspirin 325 mg daily.  7. Fiber.   SOCIAL  HISTORY:  The patient is retired.  She is a widow.  She has 5  children, 15 grandchildren, and 16 great grandchildren, and 1 great  great grandchild.  She does not smoke cigarettes or drink alcohol.   FAMILY HISTORY:  Noncontributory for early coronary artery disease,  heart failure, and arrhythmia in the first-degree relatives.   REVIEW OF SYSTEMS:  Positive for occasional headaches, glasses,  decreased hearing, and lower extremity swelling.  Negative for all other  systems.   PHYSICAL EXAMINATION:  GENERAL:  The patient is in no distress.  VITAL SIGNS:  Blood pressure is 129/88, heart rate 105 and irregular.  HEENT:  Eyelids are unremarkable.  Pupils equal, round, and reactive to  light.  Fundi not visualized.  Oral mucosa unremarkable.  NECK:  No jugular venous distention at 45 degrees, carotid upstroke  brisk and symmetrical.  No bruits, no thyromegaly.  LYMPHATICS:  No cervical, axillary, or inguinal adenopathy.  LUNGS:  Clear to auscultation bilaterally.  BACK:  No costovertebral angle tenderness.  CHEST:  Unremarkable.  HEART:  PMI not displaced or sustained, S1 and S2 within normal, no S3,  2/6 apical systolic murmur nonradiating, no diastolic murmurs.  ABDOMEN:  Flat, positive bowel sounds normal in frequency and pitch, no  bruits, no rebound, no guarding, and no midline pulsatile mass, no  hepatomegaly, no splenomegaly.  SKIN:  No rashes, no nodules.  EXTREMITIES:  2+ pulse throughout, mild bilateral lower extremity edema,  no cyanosis, no clubbing.  NEURO:  Oriented to person, place, and time, cranial nerves II-XII are  grossly intact, motor grossly intact throughout.   EKG, atrial fibrillation with a rapid ventricular rate, right bundle-  branch block, no acute ST-T wave changes.   ASSESSMENT/PLAN:  1. Atrial fibrillation.  The patient has atrial fibrillation, probably      feels some of this.  She is on a good dose of Cardizem and beta      blocker.  I am going to  add digoxin 0.125 mg daily after checking      her creatinine.  In 2 weeks, I will have her wear a 24-hour Holter      monitor to make sure she has adequate rate control.  We discussed      the risks and benefits of Coumadin at length.  I suggested Coumadin      for her as her risk of stroke was quite high.  However, her      daughter had a bad outcome with Coumadin and she does not want to      think about using it at this point, but we will continue to have      this discussion.  2. Chest discomfort.  The patient does have some chest discomfort,      which may well be related to the fibrillation.  We will see if this      goes away with rate control.  She probably would want a      conservative therapy.  3. Hypertension.  She had this in the hospital, but she has got a      normal blood pressure at present.  She will continue the      medications as listed.  4. Pneumonia.  She seems to have cleared from this.  5. Followup.  I would like to see her back in 1 month or sooner if      needed.     Rollene Rotunda, MD, [facility]  Electronically Signed    JH/MedQ  DD: 11/14/2007  DT: 11/15/2007  Job #: [id number]   cc:   Willow Ora, MD

## 2010-10-14 NOTE — Discharge Summary (Signed)
[facility]  Patient:    Sylvia Duncan, RATLEDGE Visit Number: [id number] MRN: [id number]          Service Type: MED Location: 3W 0345 02 Attending Physician:  Dorena Cookey Dictated by:   Claretta Fraise, M.D. Admit Date:  02/08/2001 Discharge Date: 02/11/2001                             Discharge Summary  DISCHARGE DIAGNOSES: 1. Question of atrial fibrillation. This was subsequently decided that she    at this point in time does not have any atrial fibrillation but rather    normal sinus rhythm to sinus bradycardia with frequent premature atrial    contractions. 2. Question of hypertension. Patient is going to stay on the Altace until    further decided upon by her newly designated primary care physician. 3. Headache consistent with cluster headaches.  DISCHARGE MEDICATIONS: 1. Altace 5 mg p.o. q.d. 2. Coated baby aspirin 81 mg p.o. q.d.  DISCHARGE FOLLOWUP:  The patient is to set up a followup new patient visit with Dr. Frederico Hamman at the [facility] primary care office and she has been given the phone number to call to set up this visit next week. In addition, the patient is also set up for a Cardiolite stress test on October 3 at 1:15 p.m. She is also going to be given the number to reschedule that appointment if she cannot make that appointment. The event monitor office will call the patient to set up an event monitor as an outpatient also.  DISCHARGE ACTIVITY:  No restrictions.  DISCHARGE DIET:  No restrictions.  HOSPITAL COURSE:  This 77 year old female was seen at the Urgent Care for complaints of headache and at that time, during the examination, was noted to have an irregular rhythm. A 12-lead EKG was obtained and was felt that she was in atrial fibrillation that was rate controlled. The patient was subsequently sent to the [facility] ER for further evaluation. The main complaint that she has had is progressive dyspnea on  exertion over the past year. However, she denies any chest pain, tightness, pressure, or any orthopnea. She has noted during the course of exercise that she might have some rapid heartbeats with exertion, even when she is running her sweeper. The patient was admitted. She was ruled out for serial CPKs and all of them have been negative. She was initially started on anticoagulation while determining whether this was true atrial fibrillation. In the ER, a 12-lead EKG was obtained by [place] and technically showed her to be in sinus bradycardia with premature atrial contractions. There was no evidence for atrial fibrillation. She had all of the baseline labs done for someone who presents with possible new onset atrial fibrillation. Her CBC was fairly unremarkable with an H&H of 13.1 and 37.8. Her electrolytes were also fairly unremarkable, in fact, all of them were normal. TSH was also normal, at one point 0.028. The patient, during the course of her stay here on telemetry, had remained in sinus rhythm with some sinus bradycardia episodes and consistently showing premature atrial contractions. She had a 2-D echocardiogram done and the results were called into me which technically was normal, had normal LV function. She does have a trace MR but no other abnormalities were noted. It was felt by cardiology, based on the fact that she has normal echocardiogram and the fact that during her hospital stay here she  has remained in sinus rhythm, that her anticoagulation can be stopped and there was no evidence at this point in time for atrial fibrillation. It was recommended that the patient have a Cardiolit stress test for her dyspnea on exertion, done as an outpatient, and also an event monitor, and both of these have been arranged.  There was a question of whether she has had hypertension. The patient denies ever being told her blood pressures have been elevated during the numerous times that she has  gone to the Urgent Care. In any case, the patient was subsequently started on Altace during her admission here because the initial blood pressure was relatively elevated with a systolic of 182 and a diastolic of 92. She seems to have tolerated this fairly well and during her hospital stay here, most of her blood pressures actually have been pretty good, the lowest being 115/67. On the day of discharge, her blood pressure was 148/79. Her blood pressure range has been anywhere from 115-134/66-72 with the exception of todays. I advised the patient to remain on the Altace and this can be determined whether she needs continued Altace as an outpatient.  Please note that the patient had complained of left sided headache around the eye described as pressure. On further questioning, it was determined that it was more typically suggestive of cluster headaches where she gets ipsilateral rhinorrhea and watery eyes. Ibuprofen was given to her and this resolved the problem. Sed rate was ordered by the ER physician and this was just minimally elevated at 30. This was felt to be insignificant for any suggestion of temporal arteritis. Patient once again denied any fever or jaw claudication or visual disturbances or episodes of blindness, and I felt very strongly that this was more cluster headaches.  LABORATORY WORKUP:  From admission, her initial white count was 7.7 with an H&H of 13.1 and 37.8, platelet count of 227,000 with a normal differential with PMNs of 63%, lymphocytes of 26. Her initial PT and INR were 13.5 and 1. She did have a lipid panel done and that showed a total cholesterol of 183, triglyceride of 70, HDL of 54, LDL of 115, and this was done as fasting. CPKs came back negative. Her first set total CPK was only 7 with an MB 0.7. The second set was not done, although it was ordered, but the third one showed a CPK of 70, MB of 0.6, troponin of 0.01.  The patient is being discharged in  stable condition with followup as previously mentioned. Dictated by:   Claretta Fraise, M.D. Attending Physician:  Dorena Cookey DD:  02/11/01 TD:  02/11/01  Job: [id number]

## 2010-10-25 ENCOUNTER — Encounter: Payer: Self-pay | Admitting: Cardiology

## 2010-10-26 ENCOUNTER — Ambulatory Visit (HOSPITAL_COMMUNITY)
Admission: RE | Admit: 2010-10-26 | Discharge: 2010-10-26 | Disposition: A | Discharge status: Home / Self Care | Payer: Medicare Other | Source: Ambulatory Visit | Attending: Internal Medicine | Admitting: Internal Medicine

## 2010-10-26 DIAGNOSIS — Z1231 Encounter for screening mammogram for malignant neoplasm of breast: Secondary | ICD-10-CM

## 2010-11-04 ENCOUNTER — Encounter: Payer: Self-pay | Admitting: Internal Medicine

## 2010-11-04 ENCOUNTER — Ambulatory Visit (INDEPENDENT_AMBULATORY_CARE_PROVIDER_SITE_OTHER): Payer: Medicare Other | Admitting: Internal Medicine

## 2010-11-04 ENCOUNTER — Ambulatory Visit (INDEPENDENT_AMBULATORY_CARE_PROVIDER_SITE_OTHER): Payer: Medicare Other | Admitting: Cardiology

## 2010-11-04 ENCOUNTER — Encounter: Payer: Self-pay | Admitting: Cardiology

## 2010-11-04 DIAGNOSIS — K625 Hemorrhage of anus and rectum: Secondary | ICD-10-CM

## 2010-11-04 DIAGNOSIS — I4891 Unspecified atrial fibrillation: Secondary | ICD-10-CM

## 2010-11-04 DIAGNOSIS — I10 Essential (primary) hypertension: Secondary | ICD-10-CM

## 2010-11-04 DIAGNOSIS — R21 Rash and other nonspecific skin eruption: Secondary | ICD-10-CM

## 2010-11-04 LAB — BASIC METABOLIC PANEL
CO2: 33 mEq/L — ABNORMAL HIGH (ref 19–32)
Chloride: 101 mEq/L (ref 96–112)
Sodium: 142 mEq/L (ref 135–145)

## 2010-11-04 LAB — CBC WITH DIFFERENTIAL/PLATELET
Basophils Relative: 0.3 % (ref 0.0–3.0)
Eosinophils Absolute: 0.1 10*3/uL (ref 0.0–0.7)
Eosinophils Relative: 1 % (ref 0.0–5.0)
Hemoglobin: 13.1 g/dL (ref 12.0–15.0)
Lymphocytes Relative: 21.3 % (ref 12.0–46.0)
MCHC: 33.9 g/dL (ref 30.0–36.0)
MCV: 96.5 fl (ref 78.0–100.0)
Monocytes Absolute: 1.1 10*3/uL — ABNORMAL HIGH (ref 0.1–1.0)
Neutro Abs: 5.7 10*3/uL (ref 1.4–7.7)
RBC: 4 Mil/uL (ref 3.87–5.11)
WBC: 8.8 10*3/uL (ref 4.5–10.5)

## 2010-11-04 MED ORDER — VALACYCLOVIR HCL 500 MG PO TABS: 1000.0000 mg | ORAL_TABLET | Freq: Two times a day (BID) | ORAL | Status: DC

## 2010-11-04 NOTE — Progress Notes (Signed)
  Subjective:    Patient ID: Sylvia Duncan, female    DOB: 1912/10/28, 77 y.o.   MRN: [id number]  HPI Developed a rash in the chest 2 days ago, denies  hurting but the rash is actually itching. Also complained of pain at base of fifth right toe .  Past Medical History  Diagnosis Date  . Hypertension   . Osteopenia   . Atrial fibrillation   . Impaired gait   . Gait abnormality     imbalance  . Urinary incontinence   . Diarrhea     chronic  . Barrett's esophagus   . Osteoarthritis    Past Surgical History  Procedure Date  . Cholecystectomy     had pancreatitis  . Hysterectomy-unknown     Review of Systems Denies any fevers, no other  rashes anywhere else, she never had shingles before.    Objective:   Physical Exam  Constitutional: She is oriented to person, place, and time. She appears well-developed and well-nourished. No distress.  Musculoskeletal:       Severely deformed right fifth toe, the area is not hot, red, swollen. She does have a 2 mm scabbed without discharge  Neurological: She is alert and oriented to person, place, and time.  Skin: She is not diaphoretic.             Assessment & Plan:   RASH: Is hard to say if the rash is shingles or something else, certainly the location points to possible shingles. If this is indeed a  herpetic rash, this will be her opportunities to be treated and prevent prolonged morbidity. Plan: Valtrex, adjusted to kidney function. See instructions  Toe deformity: Chronic toe deformity, fortunately no evidence of an acute process or infection. She has seen a podiatrist before, I don't have any other suggestions except the use of wide shoes or possibly a specially  made shoe to accommodate her deformity.

## 2010-11-04 NOTE — Assessment & Plan Note (Signed)
Over this could be zoster. It is itchy but not painful. I would like for her to see her primary care and I will try to arrange for this as soon as possible.

## 2010-11-04 NOTE — Patient Instructions (Addendum)
Your physician recommends that you schedule a follow-up appointment in 1 year with Dr. Hoyle Barr will have blood work checked today.   Please follow up with Dr. Drue Novel regarding your rash.

## 2010-11-04 NOTE — Assessment & Plan Note (Signed)
Her blood pressure is controlled and she will continue meds as listed. 

## 2010-11-04 NOTE — Patient Instructions (Signed)
Take meds as prescribed Call if the rash gets worse or no better in 2 weeks

## 2010-11-04 NOTE — Assessment & Plan Note (Signed)
I will check a CBC and also some other routine labs including TSH.

## 2010-11-04 NOTE — Assessment & Plan Note (Signed)
We discussed the risk of stroke. She would absolutely not want blood thinners. She will continue with aspirin. No further change in therapy is indicated.

## 2010-11-04 NOTE — Progress Notes (Signed)
HPI Patient presents for followup of atrial fibrillation. Since I last saw her she has had no new cardiovascular complaints. She is in atrial fibrillation today but she would not notice this. She has no palpitations, presyncope or syncope. She gets around with a walker and still drives her car. She does still have some occasional bleeding from her bowels but this she says is mild. She's had no new shortness of breath, PND or orthopnea. She has had no weight gain or edema. She does have a new vesicular rash in her right anterior mid chest. This is pruritic but not painful.  Allergies  Allergen Reactions  . Codeine     REACTION: nausea    Current Outpatient Prescriptions  Medication Sig Dispense Refill  . aspirin 325 MG tablet Take 325 mg by mouth daily.        . Calcium Polycarbophil (FIBER) 625 MG TABS Take by mouth daily.        . digoxin (LANOXIN) 0.125 MG tablet Take 125 mcg by mouth daily.        Marland Kitchen diltiazem (CARDIZEM) 90 MG tablet Take 90 mg by mouth 3 (three) times daily.        . furosemide (LASIX) 20 MG tablet Take 20 mg by mouth daily.        . metoprolol tartrate (LOPRESSOR) 25 MG tablet Take 25 mg by mouth daily.       . potassium chloride (ED K+10) 10 MEQ CR tablet Take 10 mEq by mouth daily.          Past Medical History  Diagnosis Date  . Hypertension   . Osteopenia   . Atrial fibrillation   . Impaired gait   . Gait abnormality     imbalance  . Urinary incontinence   . Diarrhea     chronic  . Barrett's esophagus   . Osteoarthritis     Past Surgical History  Procedure Date  . Cholecystectomy     had pancreatitis  . Hysterectomy-unknown     ROS:  As stated in the HPI and negative for all other systems.  PHYSICAL EXAM BP 124/77  Pulse 81  Resp 16  Ht 5\' 4"  (1.626 m)  Wt 131 lb (59.421 kg)  BMI 22.49 kg/m2 GENERAL:  Well appearing for her advanced age HEENT:  Pupils equal round and reactive, fundi not visualized, oral mucosa unremarkable NECK:  No  jugular venous distention, waveform within normal limits, carotid upstroke brisk and symmetric, no bruits, no thyromegaly LYMPHATICS:  No cervical, inguinal adenopathy LUNGS:  Clear to auscultation bilaterally BACK:  No CVA tenderness CHEST:  Unremarkable HEART:  PMI not displaced or sustained,S1 and S2 within normal limits, no S3, no S4, no clicks, no rubs, no murmurs ABD:  Flat, positive bowel sounds normal in frequency in pitch, no bruits, no rebound, no guarding, no midline pulsatile mass, no hepatomegaly, no splenomegaly EXT:  2 plus pulses throughout, no edema, no cyanosis no clubbing, muscle wasting appropriate for age. SKIN:  6 or 7 vesicular eruptions clustered in her anterior chest NEURO:  Cranial nerves II through XII grossly intact, motor grossly intact throughout PSYCH:  Cognitively intact, oriented to person place and time   EKG:  Atrial fibrillation, leftward axis, right bundle branch block, premature ectopic complexes, no acute ST-T wave changes  ASSESSMENT AND PLAN

## 2010-12-05 ENCOUNTER — Other Ambulatory Visit: Payer: Self-pay | Admitting: *Deleted

## 2010-12-05 MED ORDER — DILTIAZEM HCL 90 MG PO TABS: 90.0000 mg | ORAL_TABLET | Freq: Three times a day (TID) | ORAL | Status: DC

## 2010-12-05 MED ORDER — POTASSIUM CHLORIDE 10 MEQ PO TBCR: 10.0000 meq | EXTENDED_RELEASE_TABLET | Freq: Every day | ORAL | Status: DC

## 2010-12-05 MED ORDER — METOPROLOL TARTRATE 25 MG PO TABS: 50.0000 mg | ORAL_TABLET | Freq: Every evening | ORAL | Status: DC

## 2010-12-05 MED ORDER — FUROSEMIDE 20 MG PO TABS: 20.0000 mg | ORAL_TABLET | Freq: Every day | ORAL | Status: DC

## 2010-12-05 MED ORDER — DIGOXIN 125 MCG PO TABS: 125.0000 ug | ORAL_TABLET | Freq: Every day | ORAL | Status: DC

## 2010-12-05 NOTE — Telephone Encounter (Signed)
Sent to [facility].

## 2010-12-22 ENCOUNTER — Other Ambulatory Visit: Payer: Self-pay | Admitting: Internal Medicine

## 2010-12-22 MED ORDER — FUROSEMIDE 20 MG PO TABS: 20.0000 mg | ORAL_TABLET | Freq: Every day | ORAL | Status: DC

## 2010-12-22 MED ORDER — METOPROLOL TARTRATE 25 MG PO TABS: 50.0000 mg | ORAL_TABLET | Freq: Every evening | ORAL | Status: DC

## 2010-12-22 MED ORDER — DIGOXIN 125 MCG PO TABS: 125.0000 ug | ORAL_TABLET | Freq: Every day | ORAL | Status: DC

## 2010-12-22 MED ORDER — DILTIAZEM HCL 90 MG PO TABS: 90.0000 mg | ORAL_TABLET | Freq: Three times a day (TID) | ORAL | Status: DC

## 2010-12-22 MED ORDER — POTASSIUM CHLORIDE 10 MEQ PO TBCR: 10.0000 meq | EXTENDED_RELEASE_TABLET | Freq: Every day | ORAL | Status: DC

## 2010-12-22 NOTE — Telephone Encounter (Signed)
Sent in

## 2010-12-23 ENCOUNTER — Telehealth: Payer: Self-pay | Admitting: *Deleted

## 2010-12-23 NOTE — Telephone Encounter (Signed)
[employer] does not cover her medications, need to know which [employer] company she uses or she needs to contact her insurance company to find out the preferred pharmacy. Left message for pt to return call.

## 2010-12-26 ENCOUNTER — Encounter: Payer: Self-pay | Admitting: Family

## 2010-12-26 ENCOUNTER — Ambulatory Visit (INDEPENDENT_AMBULATORY_CARE_PROVIDER_SITE_OTHER): Payer: Medicare Other | Admitting: Family

## 2010-12-26 VITALS — BP 108/70 | HR 112 | Temp 98.4°F | Resp 15 | Wt 128.0 lb

## 2010-12-26 DIAGNOSIS — N760 Acute vaginitis: Secondary | ICD-10-CM

## 2010-12-26 DIAGNOSIS — R531 Weakness: Secondary | ICD-10-CM

## 2010-12-26 DIAGNOSIS — R300 Dysuria: Secondary | ICD-10-CM | POA: Insufficient documentation

## 2010-12-26 DIAGNOSIS — I4891 Unspecified atrial fibrillation: Secondary | ICD-10-CM

## 2010-12-26 DIAGNOSIS — R5383 Other fatigue: Secondary | ICD-10-CM

## 2010-12-26 LAB — CBC WITH DIFFERENTIAL/PLATELET
Basophils Absolute: 0 K/uL (ref 0.0–0.1)
Basophils Relative: 0 % (ref 0–1)
Eosinophils Absolute: 0.1 K/uL (ref 0.0–0.7)
Eosinophils Relative: 1 % (ref 0–5)
HCT: 41.2 % (ref 36.0–46.0)
Hemoglobin: 13.4 g/dL (ref 12.0–15.0)
Lymphocytes Relative: 20 % (ref 12–46)
Lymphs Abs: 1.4 K/uL (ref 0.7–4.0)
MCH: 31.9 pg (ref 26.0–34.0)
MCHC: 32.5 g/dL (ref 30.0–36.0)
MCV: 98.1 fL (ref 78.0–100.0)
Monocytes Absolute: 0.7 K/uL (ref 0.1–1.0)
Monocytes Relative: 10 % (ref 3–12)
Neutro Abs: 4.6 K/uL (ref 1.7–7.7)
Neutrophils Relative %: 68 % (ref 43–77)
Platelets: 210 K/uL (ref 150–400)
RBC: 4.2 MIL/uL (ref 3.87–5.11)
RDW: 15.4 % (ref 11.5–15.5)
WBC: 6.8 K/uL (ref 4.0–10.5)

## 2010-12-26 LAB — POCT URINALYSIS DIPSTICK
Bilirubin, UA: NEGATIVE
Blood, UA: NEGATIVE
Glucose, UA: NEGATIVE
Nitrite, UA: NEGATIVE
Spec Grav, UA: 1.005
pH, UA: 6

## 2010-12-26 MED ORDER — METOPROLOL TARTRATE 25 MG PO TABS: 50.0000 mg | ORAL_TABLET | Freq: Every evening | ORAL | Status: DC

## 2010-12-26 MED ORDER — POTASSIUM CHLORIDE 10 MEQ PO TBCR: 10.0000 meq | EXTENDED_RELEASE_TABLET | Freq: Every day | ORAL | Status: DC

## 2010-12-26 MED ORDER — DILTIAZEM HCL 90 MG PO TABS: 90.0000 mg | ORAL_TABLET | Freq: Three times a day (TID) | ORAL | Status: DC

## 2010-12-26 MED ORDER — FLUCONAZOLE 150 MG PO TABS: 150.0000 mg | ORAL_TABLET | Freq: Once | ORAL | Status: AC

## 2010-12-26 MED ORDER — FUROSEMIDE 20 MG PO TABS: 20.0000 mg | ORAL_TABLET | Freq: Every day | ORAL | Status: DC

## 2010-12-26 MED ORDER — DIGOXIN 125 MCG PO TABS: 125.0000 ug | ORAL_TABLET | Freq: Every day | ORAL | Status: DC

## 2010-12-26 NOTE — Assessment & Plan Note (Signed)
I suspect that her complaint of weakness is due to AF and lack of medications for the last 1 week.  EKG today notes AFRx's have been sent to [facility].  I recommended that she resume her meds today and f/u with Dr. Drue Novel in 1 week.

## 2010-12-26 NOTE — Patient Instructions (Addendum)
Please restart your medications this afternoon. Complete your blood work on the first floor.  Follow up with Dr. Drue Novel in 1 week, sooner if your symptoms worsen or if they do not improve.

## 2010-12-26 NOTE — Progress Notes (Signed)
Subjective:    Patient ID: Sylvia Duncan, female    DOB: 01/03/1913, 77 y.o.   MRN: [id number]  HPI  Sylvia Duncan is a 77 yr old female with history of AF who presents today with her daughter with chief complaint of weakness.  She reports that she has been without medication for 1 week. She tells me that her medications were sent to the wrong mail order company by accident.  She reports that yesterday she started having "weak spells." She also notes that she has been getting red "spotches" on her skin.  She reports + associate shortness of breath and palpitations: "heart completely out of rhythm."  She also reports  + dysuria- chronic and a thick white vaginal discharge.  Denies vaginal itching.    Review of Systems See HPI  Past Medical History  Diagnosis Date  . Hypertension   . Osteopenia   . Atrial fibrillation   . Impaired gait   . Gait abnormality     imbalance  . Urinary incontinence   . Diarrhea     chronic  . Barrett's esophagus   . Osteoarthritis     History   Social History  . Marital Status: Widowed    Spouse Name: N/A    Number of Children: N/A  . Years of Education: N/A   Occupational History  . Not on file.   Social History Main Topics  . Smoking status: Never Smoker   . Smokeless tobacco: Not on file  . Alcohol Use: Not on file  . Drug Use: Not on file  . Sexually Active: Not on file   Other Topics Concern  . Not on file   Social History Narrative  . No narrative on file    Past Surgical History  Procedure Date  . Cholecystectomy     had pancreatitis  . Hysterectomy-unknown     No family history on file.  Allergies  Allergen Reactions  . Codeine     REACTION: nausea    Current Outpatient Prescriptions on File Prior to Visit  Medication Sig Dispense Refill  . aspirin 325 MG tablet Take 325 mg by mouth daily.        . Calcium Polycarbophil (FIBER) 625 MG TABS Take by mouth daily.        . digoxin (LANOXIN) 0.125 MG tablet Take 1  tablet (125 mcg total) by mouth daily.  30 tablet  0  . diltiazem (CARDIZEM) 90 MG tablet Take 1 tablet (90 mg total) by mouth 3 (three) times daily.  30 tablet  0  . furosemide (LASIX) 20 MG tablet Take 1 tablet (20 mg total) by mouth daily.  30 tablet  0  . metoprolol tartrate (LOPRESSOR) 25 MG tablet Take 2 tablets (50 mg total) by mouth every evening.  30 tablet  0  . potassium chloride (ED K+10) 10 MEQ CR tablet Take 1 tablet (10 mEq total) by mouth daily.  30 tablet  0    BP 108/70  Pulse 112  Temp(Src) 98.4 F (36.9 C) (Oral)  Resp 15  Wt 128 lb 0.6 oz (58.079 kg)       Objective:   Physical Exam  Constitutional: She appears well-developed and well-nourished.       Seated in wheelchair.   Cardiovascular: An irregular rhythm present.  Pulmonary/Chest: Effort normal and breath sounds normal.  Abdominal: Soft. Bowel sounds are normal.  Skin:       No rash is noted.  Psychiatric: She has a  normal mood and affect. Her speech is normal and behavior is normal. Thought content normal.          Assessment & Plan:   No problem-specific assessment & plan notes found for this encounter.

## 2010-12-26 NOTE — Assessment & Plan Note (Signed)
UA unremarkable.  Will send for culture.  May be secondary to vaginitis.

## 2010-12-26 NOTE — Assessment & Plan Note (Signed)
Pt with c/o white thick vaginal discharge.  Will treat with Diflucan.  If no improvement will need formal GYN exam.  Deferred today as pt is in wheel chair.

## 2010-12-26 NOTE — Telephone Encounter (Signed)
[facility] and a 30 day supply to [facility] [address]

## 2010-12-27 ENCOUNTER — Encounter: Payer: Self-pay | Admitting: Family

## 2010-12-27 LAB — BASIC METABOLIC PANEL
BUN: 24 mg/dL — ABNORMAL HIGH (ref 6–23)
Calcium: 9.6 mg/dL (ref 8.4–10.5)
Creat: 1.09 mg/dL (ref 0.50–1.10)

## 2011-01-05 ENCOUNTER — Ambulatory Visit (INDEPENDENT_AMBULATORY_CARE_PROVIDER_SITE_OTHER): Payer: Medicare Other | Admitting: Internal Medicine

## 2011-01-05 ENCOUNTER — Encounter: Payer: Self-pay | Admitting: Internal Medicine

## 2011-01-05 DIAGNOSIS — M949 Disorder of cartilage, unspecified: Secondary | ICD-10-CM

## 2011-01-05 DIAGNOSIS — R269 Unspecified abnormalities of gait and mobility: Secondary | ICD-10-CM

## 2011-01-05 DIAGNOSIS — Z0000 Encounter for general adult medical examination without abnormal findings: Secondary | ICD-10-CM | POA: Insufficient documentation

## 2011-01-05 DIAGNOSIS — I4891 Unspecified atrial fibrillation: Secondary | ICD-10-CM

## 2011-01-05 DIAGNOSIS — M899 Disorder of bone, unspecified: Secondary | ICD-10-CM

## 2011-01-05 DIAGNOSIS — I10 Essential (primary) hypertension: Secondary | ICD-10-CM

## 2011-01-05 NOTE — Assessment & Plan Note (Addendum)
Chart reviewed: Td 2010 Pneumonia shot completed Discussed shingles immunization , info provided  MMG 4-08,  given age no further MMG Saw gyn 2009 --rectal prolapse: declined surgical referal --vag prolapse, observation --perirectal  Bx--benign  Cscope 04-2001 for diarrhea, no bx, terminalileum not seen. Given her age will hold further screening  Doing great, encouraged to cont to be active, fall prevention discussed  All recent labs reviewed , we called her pharmacy, they said her meds were sent 6 days ago

## 2011-01-05 NOTE — Assessment & Plan Note (Signed)
Well controlled, no change, recent BMP normal

## 2011-01-05 NOTE — Assessment & Plan Note (Signed)
Fortunately doing well 

## 2011-01-05 NOTE — Progress Notes (Signed)
Subjective:    Patient ID: Sylvia Duncan, female    DOB: 08-Feb-1913, 77 y.o.   MRN: [id number]  HPI Here for Medicare AWV:  1. Risk factors based on Past M, S, F history: reviewed 2. Physical Activities: does home chores , active    3. Depression/mood: no problems noted or reported    4. Hearing:  Has hearing aids , sees audiologist routinely 5. ADL's:  Independent, still drives, daughter helps when needed  6. Fall Risk: no recent falls  7. home Safety: does feel safe at home  8. Height, weight, &visual acuity: see VS, vision corrected w/ glasses but still poor , sees eye doctor  9. Counseling: provided 10. Labs ordered based on risk factors: if needed  11. Referral Coordination: if needed 12.  Care Plan, see assessment and plan  13.   Cognitive Assessment: The patient is 77, still independent, she is frail but fortunately uses her walker daily and had no recent falls.  In addition, today we discussed the following: Patient quite frustrated about her medications refilled, asked me to [facility]  [facility] to be sure she will get her a 90 day supply of medicines: we did , according to them , meds were sent already Hypertension, good medication compliance History of gait abnormality, she uses her walker every day, no recent falls. History of chronic diarrhea, still has to really loose stools. Was recently seen with vaginitis, no from Sylvia Duncan reviewed: . Vaginitis symptoms resolved All recent labs also reviewed.   Past Medical History  Diagnosis Date  . Hypertension   . Osteopenia   . Atrial fibrillation     not coumadin candidate  . Impaired gait   . Gait abnormality     imbalance  . Urinary incontinence   . Diarrhea     chronic  . Barrett's esophagus   . Osteoarthritis   . Edema leg     chronic, LEFT leg, u/s 2009 neg for DVT    Past Medical History  Diagnosis Date  . Hypertension   . Osteopenia   . Atrial fibrillation     not coumadin candidate   . Impaired gait   . Gait abnormality     imbalance  . Urinary incontinence   . Diarrhea     chronic  . Barrett's esophagus   . Osteoarthritis   . Edema leg     chronic, LEFT leg, u/s 2009 neg for DVT     Social History: widow lives by self 2 daughter, in [place]; [place] and [place] older one is POA lost 2 children drives ADL: independent daughter lives in [place] Tobacco-- never ETOH-- no  FH Non contributory  Review of Systems Denies chest pain or shortness of breath No cough or wheezing. No nausea, vomiting, bloating the stools. No dysuria or gross hematuria at the present time.     Objective:   Physical Exam  Constitutional: She is oriented to person, place, and time. She appears well-developed and well-nourished. No distress.  HENT:  Head: Normocephalic and atraumatic.  Neck: No thyromegaly present.       Good carotid pulses  Cardiovascular:       Irregularly irregular, no murmur  Pulmonary/Chest:       Slightly decreased breath sounds otherwise clear  Abdominal: Soft. She exhibits no distension. There is no tenderness.  Musculoskeletal:       Slightly swollen left leg, a chronic finding.  Neurological: She is alert and oriented to person, place, and time.  Skin: She is not diaphoretic.  Psychiatric: She has a normal mood and affect. Her behavior is normal. Judgment and thought content normal.          Assessment & Plan:

## 2011-01-05 NOTE — Assessment & Plan Note (Addendum)
Chart reviewed  Bone density 2003 showed osteopenia Bone density test 2008 was normal History of esophageal stricture Does take Ca and Vit D Discuss possible bone density test--- declined

## 2011-01-05 NOTE — Assessment & Plan Note (Addendum)
Good rate controlled, asx , not a Coumadin candidate

## 2011-02-20 LAB — DIFFERENTIAL
Basophils Absolute: 0
Basophils Relative: 0
Eosinophils Absolute: 0
Eosinophils Relative: 0
Lymphocytes Relative: 9 — ABNORMAL LOW
Lymphs Abs: 0.9
Monocytes Absolute: 1.1 — ABNORMAL HIGH
Monocytes Relative: 11
Neutro Abs: 8.4 — ABNORMAL HIGH
Neutrophils Relative %: 80 — ABNORMAL HIGH

## 2011-02-20 LAB — CBC
HCT: 33.3 — ABNORMAL LOW
HCT: 34.7 — ABNORMAL LOW
Hemoglobin: 11.2 — ABNORMAL LOW
Hemoglobin: 11.4 — ABNORMAL LOW
Hemoglobin: 11.6 — ABNORMAL LOW
Hemoglobin: 11.8 — ABNORMAL LOW
MCHC: 33.9
MCHC: 33.9
MCHC: 34
MCV: 93.5
MCV: 93.8
MCV: 94.6
Platelets: 149 — ABNORMAL LOW
RBC: 3.42 — ABNORMAL LOW
RBC: 3.52 — ABNORMAL LOW
RBC: 3.55 — ABNORMAL LOW
RBC: 3.71 — ABNORMAL LOW
RDW: 16.4 — ABNORMAL HIGH
RDW: 16.6 — ABNORMAL HIGH
RDW: 16.8 — ABNORMAL HIGH
WBC: 10.5
WBC: 8.7

## 2011-02-20 LAB — FERRITIN: Ferritin: 129 (ref 10–291)

## 2011-02-20 LAB — HEMOGLOBIN A1C
Hgb A1c MFr Bld: 6.3 — ABNORMAL HIGH
Mean Plasma Glucose: 147

## 2011-02-20 LAB — BASIC METABOLIC PANEL
CO2: 25
CO2: 28
CO2: 30
CO2: 32
Calcium: 7.5 — ABNORMAL LOW
Calcium: 8.1 — ABNORMAL LOW
Calcium: 8.1 — ABNORMAL LOW
Calcium: 8.1 — ABNORMAL LOW
Calcium: 8.5
Chloride: 103
Chloride: 99
Creatinine, Ser: 1.08
Creatinine, Ser: 1.17
GFR calc Af Amer: 52 — ABNORMAL LOW
GFR calc Af Amer: 54 — ABNORMAL LOW
GFR calc Af Amer: 57 — ABNORMAL LOW
GFR calc non Af Amer: 43 — ABNORMAL LOW
GFR calc non Af Amer: 43 — ABNORMAL LOW
Glucose, Bld: 106 — ABNORMAL HIGH
Glucose, Bld: 127 — ABNORMAL HIGH
Glucose, Bld: 135 — ABNORMAL HIGH
Potassium: 3.2 — ABNORMAL LOW
Potassium: 3.4 — ABNORMAL LOW
Sodium: 140
Sodium: 141
Sodium: 142
Sodium: 142

## 2011-02-20 LAB — RETICULOCYTES
RBC.: 3.83 — ABNORMAL LOW
Retic Count, Absolute: 99.6

## 2011-02-20 LAB — BASIC METABOLIC PANEL WITH GFR
BUN: 14
Chloride: 105
GFR calc non Af Amer: 47 — ABNORMAL LOW
Glucose, Bld: 156 — ABNORMAL HIGH
Potassium: 3.5
Sodium: 138

## 2011-02-20 LAB — I-STAT 8, (EC8 V) (CONVERTED LAB)
Bicarbonate: 30.6 — ABNORMAL HIGH
Glucose, Bld: 155 — ABNORMAL HIGH
Hemoglobin: 14.3
Sodium: 138
TCO2: 32
pCO2, Ven: 56 — ABNORMAL HIGH
pH, Ven: 7.345 — ABNORMAL HIGH

## 2011-02-20 LAB — IRON AND TIBC
Iron: 16 — ABNORMAL LOW
Saturation Ratios: 6 — ABNORMAL LOW
UIBC: 274

## 2011-02-20 LAB — B-NATRIURETIC PEPTIDE (CONVERTED LAB): Pro B Natriuretic peptide (BNP): 547 — ABNORMAL HIGH

## 2011-02-20 LAB — APTT: aPTT: 33

## 2011-02-20 LAB — SAMPLE TO BLOOD BANK

## 2011-02-20 LAB — POCT CARDIAC MARKERS
CKMB, poc: 1.1
Myoglobin, poc: 61.3
Operator id: 4708
Troponin i, poc: <0.05

## 2011-02-20 LAB — PROTIME-INR
INR: 1.3
Prothrombin Time: 16.2 — ABNORMAL HIGH

## 2011-02-20 LAB — VITAMIN B12: Vitamin B-12: 570 (ref 211–911)

## 2011-03-08 LAB — SEDIMENTATION RATE: Sed Rate: 32 — ABNORMAL HIGH

## 2011-04-02 ENCOUNTER — Emergency Department (HOSPITAL_COMMUNITY)
Admission: EM | Admit: 2011-04-02 | Discharge: 2011-04-02 | Disposition: A | Discharge status: Home / Self Care | Payer: Medicare Other | Attending: Emergency Medicine | Admitting: Emergency Medicine

## 2011-04-02 ENCOUNTER — Ambulatory Visit (HOSPITAL_COMMUNITY): Admission: RE | Admit: 2011-04-02 | Payer: Medicare Other | Source: Ambulatory Visit

## 2011-04-02 DIAGNOSIS — R110 Nausea: Secondary | ICD-10-CM | POA: Insufficient documentation

## 2011-04-02 DIAGNOSIS — R51 Headache: Secondary | ICD-10-CM | POA: Insufficient documentation

## 2011-04-02 DIAGNOSIS — I4891 Unspecified atrial fibrillation: Secondary | ICD-10-CM | POA: Insufficient documentation

## 2011-04-04 ENCOUNTER — Encounter (HOSPITAL_COMMUNITY): Payer: Self-pay | Admitting: *Deleted

## 2011-04-04 ENCOUNTER — Emergency Department (HOSPITAL_COMMUNITY)
Admission: EM | Admit: 2011-04-04 | Discharge: 2011-04-04 | Disposition: A | Discharge status: Home / Self Care | Payer: Medicare Other | Attending: Emergency Medicine | Admitting: Emergency Medicine

## 2011-04-04 ENCOUNTER — Emergency Department (HOSPITAL_COMMUNITY): Payer: Medicare Other

## 2011-04-04 DIAGNOSIS — I4891 Unspecified atrial fibrillation: Secondary | ICD-10-CM | POA: Insufficient documentation

## 2011-04-04 DIAGNOSIS — R51 Headache: Secondary | ICD-10-CM | POA: Insufficient documentation

## 2011-04-04 DIAGNOSIS — M1990 Unspecified osteoarthritis, unspecified site: Secondary | ICD-10-CM | POA: Insufficient documentation

## 2011-04-04 DIAGNOSIS — R197 Diarrhea, unspecified: Secondary | ICD-10-CM | POA: Insufficient documentation

## 2011-04-04 DIAGNOSIS — M899 Disorder of bone, unspecified: Secondary | ICD-10-CM | POA: Insufficient documentation

## 2011-04-04 DIAGNOSIS — J3489 Other specified disorders of nose and nasal sinuses: Secondary | ICD-10-CM | POA: Insufficient documentation

## 2011-04-04 DIAGNOSIS — G44009 Cluster headache syndrome, unspecified, not intractable: Secondary | ICD-10-CM

## 2011-04-04 DIAGNOSIS — I10 Essential (primary) hypertension: Secondary | ICD-10-CM | POA: Insufficient documentation

## 2011-04-04 DIAGNOSIS — M949 Disorder of cartilage, unspecified: Secondary | ICD-10-CM | POA: Insufficient documentation

## 2011-04-04 MED ORDER — METOCLOPRAMIDE HCL 5 MG/ML IJ SOLN
10.0000 mg | Freq: Once | INTRAMUSCULAR | Status: AC
  Administered 2011-04-04: 10 mg via INTRAVENOUS
  Filled 2011-04-04: qty 2

## 2011-04-04 MED ORDER — PROPARACAINE HCL 0.5 % OP SOLN
OPHTHALMIC | Status: AC
  Administered 2011-04-04: 09:00:00
  Filled 2011-04-04: qty 15

## 2011-04-04 MED ORDER — DIPHENHYDRAMINE HCL 50 MG/ML IJ SOLN
12.5000 mg | Freq: Once | INTRAMUSCULAR | Status: AC
  Administered 2011-04-04: 12.5 mg via INTRAVENOUS
  Filled 2011-04-04: qty 1

## 2011-04-04 MED ORDER — TETRACAINE HCL 0.5 % OP SOLN: 1.0000 [drp] | Freq: Once | OPHTHALMIC | Status: DC

## 2011-04-04 NOTE — ED Notes (Signed)
Pt alert, orinted x3...states she has the same headache pain on the L side of her face, behind the eye...and c/o double vision in the L eye..to Head CT and now returned.Marland KitchenMarland Kitchen

## 2011-04-04 NOTE — ED Notes (Signed)
Family at bedside. 

## 2011-04-04 NOTE — ED Provider Notes (Signed)
History     CSN: [id number] Arrival date & time: 04/04/2011  4:48 AM   First MD Initiated Contact with Patient 04/04/11 [phone]      Chief Complaint  Patient presents with  . Headache    (Consider location/radiation/quality/duration/timing/severity/associated sxs/prior treatment) Patient is a 77 y.o. female presenting with headaches.  Headache  This is a recurrent problem. The current episode started yesterday. The problem occurs constantly. The problem has not changed since onset.The headache is associated with bright light. Pain location: behind the left eye. The quality of the pain is described as sharp. The pain is at a severity of 8/10. The pain is moderate. The pain does not radiate. Pertinent negatives include no palpitations, no shortness of breath, no nausea and no vomiting. She has tried nothing for the symptoms. The treatment provided no relief.    Past Medical History  Diagnosis Date  . Hypertension   . Osteopenia   . Atrial fibrillation     not coumadin candidate  . Impaired gait   . Gait abnormality     imbalance  . Urinary incontinence   . Diarrhea     chronic  . Barrett's esophagus   . Osteoarthritis   . Edema leg     chronic, LEFT leg, u/s 2009 neg for DVT    Past Surgical History  Procedure Date  . Cholecystectomy     had pancreatitis  . Hysterectomy-unknown     History reviewed. No pertinent family history.  History  Substance Use Topics  . Smoking status: Never Smoker   . Smokeless tobacco: Not on file  . Alcohol Use: No    OB History    Grav Para Term Preterm Abortions TAB SAB Ect Mult Living                  Review of Systems  Respiratory: Negative for shortness of breath.   Cardiovascular: Negative for palpitations.  Gastrointestinal: Negative for nausea and vomiting.  Neurological: Positive for headaches. Negative for dizziness, tremors, speech difficulty, weakness and numbness.  All other systems reviewed and are  negative.    Allergies  Review of patient's allergies indicates no known allergies.  Home Medications   Current Outpatient Rx  Name Route Sig Dispense Refill  . ASPIRIN 325 MG PO TABS Oral Take 325 mg by mouth daily.      Marland Kitchen FIBER 625 MG PO TABS Oral Take by mouth daily.      Marland Kitchen DIGOXIN 0.125 MG PO TABS Oral Take 1 tablet (125 mcg total) by mouth daily. 30 tablet 0  . DILTIAZEM HCL 90 MG PO TABS Oral Take 1 tablet (90 mg total) by mouth 3 (three) times daily. 30 tablet 0  . FUROSEMIDE 20 MG PO TABS Oral Take 1 tablet (20 mg total) by mouth daily. 30 tablet 0  . METOPROLOL TARTRATE 25 MG PO TABS Oral Take 2 tablets (50 mg total) by mouth every evening. 30 tablet 0  . POTASSIUM CHLORIDE 10 MEQ PO TBCR Oral Take 1 tablet (10 mEq total) by mouth daily. 30 tablet 0    BP 138/85  Pulse 77  Temp(Src) 97.7 F (36.5 C) (Oral)  Resp 18  SpO2 96%  Physical Exam  Nursing note and vitals reviewed. Constitutional: She is oriented to person, place, and time. She appears well-developed and well-nourished. No distress.  HENT:  Head: Normocephalic and atraumatic.  Eyes: EOM are normal. Pupils are equal, round, and reactive to light. Right eye exhibits no discharge. Left  eye exhibits no discharge.  Fundoscopic exam:      The right eye shows no papilledema.       The left eye shows no papilledema.  Neck: Normal range of motion. Neck supple.  Cardiovascular: Normal rate, regular rhythm, normal heart sounds and intact distal pulses.  Exam reveals no friction rub.   No murmur heard. Pulmonary/Chest: Effort normal and breath sounds normal. She has no wheezes. She has no rales.  Abdominal: Soft. Bowel sounds are normal. She exhibits no distension. There is no tenderness. There is no rebound and no guarding.  Musculoskeletal: Normal range of motion. She exhibits no tenderness.       No edema  Lymphadenopathy:    She has no cervical adenopathy.  Neurological: She is alert and oriented to person,  place, and time. She has normal strength. No cranial nerve deficit or sensory deficit. Gait normal.       photophobia  Skin: Skin is warm and dry. No rash noted.  Psychiatric: She has a normal mood and affect. Her behavior is normal.    ED Course  Procedures (including critical care time)  Labs Reviewed - No data to display No results found.   No diagnosis found.    MDM   Pt with HA behind the left eye with eye watering and runny nose.  Pt has similar HA 2 days ago and was treated with reglan and toradol which she states helped but then the pain returned.  Pt denies any fever or infectious sx.  She states that only since she has had the HA has her nose started running.  Pt has no temporal pain suggestive of temporal arteritis.  Normal neuro exam.  Will check pressure in the eye and visual acuity.  Will get CT as she did not have that evaluated before.  Denies recent med changes or neuro complaints.   Will give pain control however may be cluster HA given symptoms and pattern. 9:23 AM CT of head neg.  Occular pressure in the right eye <20.  Most likely a cluster HA and will see if meds help and pt will f/u with ophtho for check.  Visual acuity in the left eye is 20/50 and is better than the right.  9:46 AM On recheck pt feeling much better and will have f/u with ophtho and her PCP.      Gwyneth Sprout, MD 04/04/11 [phone]

## 2011-04-04 NOTE — ED Notes (Signed)
Patient is resting comfortably. 

## 2011-04-04 NOTE — ED Notes (Signed)
Eye exam by NP.the patient tol well...Sylvia KitchenMarland Duncan

## 2011-04-04 NOTE — ED Notes (Signed)
Pt in c/o headache and pain behind left eye, pt states she was seen recently for same and dx with migraine but pain returned tonight, states she also feels like she has a cold

## 2011-04-12 ENCOUNTER — Ambulatory Visit: Payer: Medicare Other | Admitting: Internal Medicine

## 2011-04-14 ENCOUNTER — Ambulatory Visit (INDEPENDENT_AMBULATORY_CARE_PROVIDER_SITE_OTHER): Payer: Medicare Other | Admitting: Internal Medicine

## 2011-04-14 ENCOUNTER — Encounter: Payer: Self-pay | Admitting: Internal Medicine

## 2011-04-14 VITALS — BP 140/80 | HR 82 | Temp 98.1°F | Wt 128.0 lb

## 2011-04-14 DIAGNOSIS — Z23 Encounter for immunization: Secondary | ICD-10-CM

## 2011-04-14 DIAGNOSIS — G43009 Migraine without aura, not intractable, without status migrainosus: Secondary | ICD-10-CM

## 2011-04-14 MED ORDER — HYDROCODONE-ACETAMINOPHEN 5-325 MG PO TABS: 1.0000 | ORAL_TABLET | ORAL | Status: DC | PRN

## 2011-04-14 NOTE — Patient Instructions (Signed)
As soon as you feel a headache: Start hydrocodone 1 tab every 4 hours Benadryl 12.5 mg (OTC) one every 6 hours Rest, fluids, ER if symptoms severe

## 2011-04-14 NOTE — Assessment & Plan Note (Addendum)
Status post 2 recent visits to the ER. Patient wondered what she could do to prevent the headaches, the headaches however are random sometimes every few months. I asked her what would be the thing that helps her the most and she said Benadryl PM but she and the daughter who is here would like something even a stronger. I recommend then to take hydrocodone 5 mg every 4 hours as needed w/onset of  Headaches plus Benadryl as needed Warned about drowsiness. At this point I don't think she needs daily, preventive therapy.

## 2011-04-14 NOTE — Progress Notes (Signed)
  Subjective:    Patient ID: Sylvia Duncan, female    DOB: 06-09-1912, 77 y.o.   MRN: [id number]  HPI Followup from the ER Was seen twice for HAs, chart reviewed , CT was negative. Symptoms all resolved She was recommended to see an eye doctor, she did that yesterday and the checkup was okay.  Past Medical History  Diagnosis Date  . Hypertension   . Osteopenia   . Atrial fibrillation     not coumadin candidate  . Impaired gait   . Gait abnormality     imbalance  . Urinary incontinence   . Diarrhea     chronic  . Barrett's esophagus   . Osteoarthritis   . Edema leg     chronic, LEFT leg, u/s 2009 neg for DVT     Review of Systems     Objective:   Physical Exam  Constitutional: She is oriented to person, place, and time. She appears well-developed and well-nourished.  Cardiovascular: Normal rate, regular rhythm and normal heart sounds.   No murmur heard. Pulmonary/Chest: Effort normal and breath sounds normal. No respiratory distress. She has no wheezes. She has no rales.  Musculoskeletal: She exhibits no edema.  Neurological: She is alert and oriented to person, place, and time.       Motor is symmetric, gait not tested, speech is fluent and memory seems normal       Assessment & Plan:  Today , I spent more than 15  min with the patient, >50% of the time counseling, and /or reviewing the chart

## 2011-04-18 ENCOUNTER — Other Ambulatory Visit: Payer: Self-pay | Admitting: Internal Medicine

## 2011-04-18 MED ORDER — DILTIAZEM HCL 90 MG PO TABS: 90.0000 mg | ORAL_TABLET | Freq: Three times a day (TID) | ORAL | Status: DC

## 2011-04-18 NOTE — Telephone Encounter (Signed)
Rx sent 

## 2011-04-24 ENCOUNTER — Other Ambulatory Visit: Payer: Self-pay

## 2011-04-24 MED ORDER — FUROSEMIDE 20 MG PO TABS: 20.0000 mg | ORAL_TABLET | Freq: Every day | ORAL | Status: DC

## 2011-04-24 MED ORDER — DIGOXIN 125 MCG PO TABS: 125.0000 ug | ORAL_TABLET | Freq: Every day | ORAL | Status: DC

## 2011-04-24 MED ORDER — POTASSIUM CHLORIDE 10 MEQ PO TBCR: 10.0000 meq | EXTENDED_RELEASE_TABLET | Freq: Every day | ORAL | Status: DC

## 2011-04-24 MED ORDER — METOPROLOL TARTRATE 25 MG PO TABS: 50.0000 mg | ORAL_TABLET | Freq: Every evening | ORAL | Status: DC

## 2011-04-25 ENCOUNTER — Institutional Professional Consult (permissible substitution): Payer: Medicare Other

## 2011-06-26 ENCOUNTER — Other Ambulatory Visit: Payer: Self-pay | Admitting: Internal Medicine

## 2011-06-26 MED ORDER — DILTIAZEM HCL 90 MG PO TABS: 90.0000 mg | ORAL_TABLET | Freq: Three times a day (TID) | ORAL | Status: DC

## 2011-06-26 MED ORDER — FUROSEMIDE 20 MG PO TABS: 20.0000 mg | ORAL_TABLET | Freq: Every day | ORAL | Status: DC

## 2011-06-26 NOTE — Telephone Encounter (Signed)
Refill done.  

## 2011-06-27 ENCOUNTER — Telehealth: Payer: Self-pay | Admitting: Internal Medicine

## 2011-06-27 NOTE — Telephone Encounter (Signed)
OK to refill

## 2011-06-27 NOTE — Telephone Encounter (Signed)
Refill-potassium chl tab cr. Take one tablet by mouth daily. Qty 90 last fill 7.30.12

## 2011-06-28 MED ORDER — POTASSIUM CHLORIDE ER 10 MEQ PO TBCR: 10.0000 meq | EXTENDED_RELEASE_TABLET | Freq: Every day | ORAL | Status: DC

## 2011-06-28 NOTE — Telephone Encounter (Signed)
RX sent

## 2011-06-28 NOTE — Telephone Encounter (Signed)
90, 1 RF 

## 2011-07-04 ENCOUNTER — Telehealth: Payer: Self-pay | Admitting: Internal Medicine

## 2011-07-04 MED ORDER — POTASSIUM CHLORIDE ER 10 MEQ PO TBCR: 10.0000 meq | EXTENDED_RELEASE_TABLET | Freq: Every day | ORAL | Status: DC

## 2011-07-04 NOTE — Telephone Encounter (Signed)
Refill done.  

## 2011-07-05 ENCOUNTER — Other Ambulatory Visit: Payer: Self-pay | Admitting: *Deleted

## 2011-07-05 MED ORDER — DILTIAZEM HCL 90 MG PO TABS: 90.0000 mg | ORAL_TABLET | Freq: Three times a day (TID) | ORAL | Status: DC

## 2011-07-05 NOTE — Telephone Encounter (Signed)
Pt need local supply sent in to pharmacy until mail order can come in. Rx sent to pharmacy.

## 2011-07-18 ENCOUNTER — Ambulatory Visit (INDEPENDENT_AMBULATORY_CARE_PROVIDER_SITE_OTHER): Payer: Medicare Other | Admitting: Internal Medicine

## 2011-07-18 ENCOUNTER — Encounter: Payer: Self-pay | Admitting: Internal Medicine

## 2011-07-18 VITALS — BP 118/70 | HR 70 | Temp 98.3°F | Wt 135.0 lb

## 2011-07-18 DIAGNOSIS — G43009 Migraine without aura, not intractable, without status migrainosus: Secondary | ICD-10-CM

## 2011-07-18 MED ORDER — HYDROCODONE-ACETAMINOPHEN 5-325 MG PO TABS: 1.0000 | ORAL_TABLET | ORAL | Status: DC | PRN

## 2011-07-18 MED ORDER — PREDNISONE 10 MG PO TABS: ORAL_TABLET | ORAL | Status: DC

## 2011-07-18 NOTE — Assessment & Plan Note (Signed)
Present w/ HA x the last 3 nights, sx are similar to previous events although slt more severe. Chart is reviewed, multiple and similar sx before w/ neg w/u, in the past I though this HA were clusters. In the past she had diplopia but not at present Plan: Conservative treatment w/ hydrocodone-benadryl Will also try a low dose of steroids, last CBG 103

## 2011-07-18 NOTE — Patient Instructions (Signed)
Take benadryl 12.5 mg OTC 1 or 2 tabs every 6 hours as needed and hydrocodone as needed Watch for somnolence For the next 3 nights, take both meds  before bedtime even before the headache start Go to the ER if the symptoms are severe Came back in 1 month for a regular check

## 2011-07-18 NOTE — Progress Notes (Signed)
  Subjective:    Patient ID: Sylvia Duncan, female    DOB: 1913-01-24, 78 y.o.   MRN: [id number]  HPI Acute visit , here w/ Sherryl For the past 3 nights, she has experienced severe headaches,they started around the left eye and then expand to the whole left head. Her symptoms are described as similar to previous headaches although they are more intense than usual.  Past Medical History  Diagnosis Date  . Hypertension   . Osteopenia   . Atrial fibrillation     not coumadin candidate  . Impaired gait   . Gait abnormality     imbalance  . Urinary incontinence   . Diarrhea     chronic  . Barrett's esophagus   . Osteoarthritis   . Edema leg     chronic, LEFT leg, u/s 2009 neg for DVT    Review of Systems Denies any fever chills No nausea or vomiting No slurred speech or facial paralysis. She has a history of diplopia although she has not experienced any ocular symptoms lately.     Objective:   Physical Exam  Alert, no apparent distress. Hard of hearing. Lungs are clear to auscultation bilaterally Cardiovascular regular rate and rhythm, no murmur. Neurological exam: Hard to asses due to decreased hearing but she seems alert oriented x3. Face symmetric. External ocular movements intact, pupils equal and reactive. Motor exam is also symmetric.    Assessment & Plan:

## 2011-07-19 ENCOUNTER — Telehealth: Payer: Self-pay | Admitting: *Deleted

## 2011-07-19 NOTE — Telephone Encounter (Signed)
Spoke with pt & she is feeling better. Told her to call if she is not feeling better & continuing to have headaches.

## 2011-07-19 NOTE — Telephone Encounter (Signed)
THX!

## 2011-08-15 ENCOUNTER — Telehealth: Payer: Self-pay | Admitting: Internal Medicine

## 2011-08-15 MED ORDER — DILTIAZEM HCL 90 MG PO TABS: 90.0000 mg | ORAL_TABLET | Freq: Three times a day (TID) | ORAL | Status: DC

## 2011-08-15 NOTE — Telephone Encounter (Signed)
Refill done. Previous refill was sent to wrong pharmacy.

## 2011-08-15 NOTE — Telephone Encounter (Signed)
Refill:  Diltiazem tab 90mg . Take 1 tablet by mouth 3 times a day. Qty 90. Last fill 04-18-11

## 2011-09-26 ENCOUNTER — Other Ambulatory Visit: Payer: Self-pay

## 2011-09-26 MED ORDER — DILTIAZEM HCL 90 MG PO TABS: 90.0000 mg | ORAL_TABLET | Freq: Three times a day (TID) | ORAL | Status: DC

## 2011-09-28 ENCOUNTER — Other Ambulatory Visit: Payer: Self-pay | Admitting: Internal Medicine

## 2011-09-28 DIAGNOSIS — Z1231 Encounter for screening mammogram for malignant neoplasm of breast: Secondary | ICD-10-CM

## 2011-10-13 ENCOUNTER — Encounter (HOSPITAL_COMMUNITY): Payer: Self-pay | Admitting: *Deleted

## 2011-10-13 ENCOUNTER — Emergency Department (HOSPITAL_COMMUNITY): Payer: Medicare Other

## 2011-10-13 ENCOUNTER — Inpatient Hospital Stay (HOSPITAL_COMMUNITY)
Admission: EM | Admit: 2011-10-13 | Discharge: 2011-10-16 | DRG: 177 | Disposition: A | Discharge status: Skilled Nursing Facility | Payer: Medicare Other | Attending: Internal Medicine | Admitting: Internal Medicine

## 2011-10-13 DIAGNOSIS — Z7982 Long term (current) use of aspirin: Secondary | ICD-10-CM

## 2011-10-13 DIAGNOSIS — I10 Essential (primary) hypertension: Secondary | ICD-10-CM | POA: Diagnosis present

## 2011-10-13 DIAGNOSIS — G44009 Cluster headache syndrome, unspecified, not intractable: Secondary | ICD-10-CM | POA: Diagnosis present

## 2011-10-13 DIAGNOSIS — I129 Hypertensive chronic kidney disease with stage 1 through stage 4 chronic kidney disease, or unspecified chronic kidney disease: Secondary | ICD-10-CM | POA: Diagnosis present

## 2011-10-13 DIAGNOSIS — R7309 Other abnormal glucose: Secondary | ICD-10-CM | POA: Diagnosis present

## 2011-10-13 DIAGNOSIS — N182 Chronic kidney disease, stage 2 (mild): Secondary | ICD-10-CM | POA: Diagnosis present

## 2011-10-13 DIAGNOSIS — F05 Delirium due to known physiological condition: Secondary | ICD-10-CM | POA: Diagnosis not present

## 2011-10-13 DIAGNOSIS — J690 Pneumonitis due to inhalation of food and vomit: Secondary | ICD-10-CM

## 2011-10-13 DIAGNOSIS — N183 Chronic kidney disease, stage 3 unspecified: Secondary | ICD-10-CM | POA: Diagnosis present

## 2011-10-13 DIAGNOSIS — J189 Pneumonia, unspecified organism: Secondary | ICD-10-CM | POA: Diagnosis present

## 2011-10-13 DIAGNOSIS — R197 Diarrhea, unspecified: Secondary | ICD-10-CM | POA: Diagnosis present

## 2011-10-13 DIAGNOSIS — M899 Disorder of bone, unspecified: Secondary | ICD-10-CM | POA: Diagnosis present

## 2011-10-13 DIAGNOSIS — R32 Unspecified urinary incontinence: Secondary | ICD-10-CM | POA: Diagnosis present

## 2011-10-13 DIAGNOSIS — M1990 Unspecified osteoarthritis, unspecified site: Secondary | ICD-10-CM | POA: Diagnosis present

## 2011-10-13 DIAGNOSIS — E876 Hypokalemia: Secondary | ICD-10-CM | POA: Diagnosis present

## 2011-10-13 DIAGNOSIS — Z79899 Other long term (current) drug therapy: Secondary | ICD-10-CM

## 2011-10-13 DIAGNOSIS — I509 Heart failure, unspecified: Secondary | ICD-10-CM | POA: Diagnosis present

## 2011-10-13 DIAGNOSIS — R1310 Dysphagia, unspecified: Secondary | ICD-10-CM | POA: Diagnosis present

## 2011-10-13 DIAGNOSIS — E871 Hypo-osmolality and hyponatremia: Secondary | ICD-10-CM | POA: Diagnosis present

## 2011-10-13 DIAGNOSIS — D509 Iron deficiency anemia, unspecified: Secondary | ICD-10-CM | POA: Diagnosis present

## 2011-10-13 DIAGNOSIS — J9601 Acute respiratory failure with hypoxia: Secondary | ICD-10-CM | POA: Diagnosis present

## 2011-10-13 DIAGNOSIS — G43009 Migraine without aura, not intractable, without status migrainosus: Secondary | ICD-10-CM | POA: Diagnosis present

## 2011-10-13 DIAGNOSIS — I4891 Unspecified atrial fibrillation: Secondary | ICD-10-CM | POA: Diagnosis present

## 2011-10-13 DIAGNOSIS — Z66 Do not resuscitate: Secondary | ICD-10-CM | POA: Diagnosis present

## 2011-10-13 DIAGNOSIS — R269 Unspecified abnormalities of gait and mobility: Secondary | ICD-10-CM | POA: Diagnosis present

## 2011-10-13 DIAGNOSIS — J9600 Acute respiratory failure, unspecified whether with hypoxia or hypercapnia: Secondary | ICD-10-CM | POA: Diagnosis present

## 2011-10-13 LAB — CBC
MCH: 31.4 pg (ref 26.0–34.0)
MCHC: 33.5 g/dL (ref 30.0–36.0)
MCV: 93.6 fL (ref 78.0–100.0)
Platelets: 158 10*3/uL (ref 150–400)
RBC: 3.73 MIL/uL — ABNORMAL LOW (ref 3.87–5.11)

## 2011-10-13 LAB — BASIC METABOLIC PANEL
CO2: 28 mEq/L (ref 19–32)
Calcium: 8.6 mg/dL (ref 8.4–10.5)
Creatinine, Ser: 1.26 mg/dL — ABNORMAL HIGH (ref 0.50–1.10)
GFR calc non Af Amer: 35 mL/min — ABNORMAL LOW (ref >90)
Glucose, Bld: 167 mg/dL — ABNORMAL HIGH (ref 70–99)

## 2011-10-13 LAB — DIFFERENTIAL
Basophils Relative: 0 % (ref 0–1)
Eosinophils Absolute: 0 10*3/uL (ref 0.0–0.7)
Eosinophils Relative: 0 % (ref 0–5)
Lymphs Abs: 1.2 10*3/uL (ref 0.7–4.0)
Monocytes Relative: 13 % — ABNORMAL HIGH (ref 3–12)
Neutrophils Relative %: 75 % (ref 43–77)

## 2011-10-13 MED ORDER — DEXTROSE 5 % IV SOLN
500.0000 mg | Freq: Once | INTRAVENOUS | Status: AC
  Administered 2011-10-13: 500 mg via INTRAVENOUS
  Filled 2011-10-13: qty 500

## 2011-10-13 MED ORDER — ACETAMINOPHEN 325 MG PO TABS
650.0000 mg | ORAL_TABLET | Freq: Once | ORAL | Status: AC
  Administered 2011-10-13: 650 mg via ORAL
  Filled 2011-10-13: qty 2

## 2011-10-13 MED ORDER — MORPHINE SULFATE 2 MG/ML IJ SOLN
2.0000 mg | Freq: Once | INTRAMUSCULAR | Status: AC
  Administered 2011-10-13: 2 mg via INTRAVENOUS
  Filled 2011-10-13: qty 1

## 2011-10-13 MED ORDER — SODIUM CHLORIDE 0.9 % IV SOLN
Freq: Once | INTRAVENOUS | Status: AC
  Administered 2011-10-13: 22:00:00 via INTRAVENOUS

## 2011-10-13 MED ORDER — DEXTROSE 5 % IV SOLN
1.0000 g | Freq: Once | INTRAVENOUS | Status: AC
  Administered 2011-10-13: 1 g via INTRAVENOUS
  Filled 2011-10-13: qty 10

## 2011-10-13 NOTE — ED Notes (Signed)
Patient transported to X-ray 

## 2011-10-13 NOTE — ED Notes (Signed)
Admitting MD at bedside.

## 2011-10-13 NOTE — ED Provider Notes (Signed)
History     CSN: [id number]  Arrival date & time 10/13/11  1850   First MD Initiated Contact with Patient 10/13/11 2151      Chief Complaint  Patient presents with  . Cough    (Consider location/radiation/quality/duration/timing/severity/associated sxs/prior treatment) HPI Comments: 3 days.  Patient has had a productive cough.  Today, developed a, fever.  She's been feeling slightly short of breath with exertion for the past 24 hours in her own home with caregivers.  Monday to Friday 8 AM to 1 PM and then to family cares for her on the weekends.   Patient is a 78 y.o. female presenting with cough. The history is provided by the patient and a relative.  Cough This is a new problem. The current episode started 2 days ago. The problem occurs every few minutes. The cough is productive of sputum. The maximum temperature recorded prior to her arrival was 103 to 104 F. The fever has been present for less than 1 day. Pertinent negatives include no chest pain, no chills, no sweats, no rhinorrhea, no shortness of breath and no wheezing. She has tried nothing for the symptoms. She is not a smoker.    Past Medical History  Diagnosis Date  . Hypertension   . Osteopenia   . Atrial fibrillation     not coumadin candidate  . Impaired gait   . Gait abnormality     imbalance  . Urinary incontinence   . Diarrhea     chronic  . Barrett's esophagus   . Osteoarthritis   . Edema leg     chronic, LEFT leg, u/s 2009 neg for DVT    Past Surgical History  Procedure Date  . Cholecystectomy     had pancreatitis  . Hysterectomy-unknown     No family history on file.  History  Substance Use Topics  . Smoking status: Never Smoker   . Smokeless tobacco: Not on file  . Alcohol Use: No    OB History    Grav Para Term Preterm Abortions TAB SAB Ect Mult Living                  Review of Systems  Constitutional: Positive for fever. Negative for chills.  HENT: Negative for rhinorrhea.     Respiratory: Positive for cough. Negative for shortness of breath and wheezing.   Cardiovascular: Negative for chest pain.  Gastrointestinal: Negative for nausea and vomiting.  Skin: Positive for pallor.  Neurological: Negative for dizziness.    Allergies  Review of patient's allergies indicates no known allergies.  Home Medications   Current Outpatient Rx  Name Route Sig Dispense Refill  . ASPIRIN 325 MG PO TABS Oral Take 325 mg by mouth daily.      Marland Kitchen FIBER 625 MG PO TABS Oral Take by mouth daily.      Marland Kitchen DIGOXIN 0.125 MG PO TABS Oral Take 1 tablet (125 mcg total) by mouth daily. 90 tablet 1  . DILTIAZEM HCL 90 MG PO TABS Oral Take 1 tablet (90 mg total) by mouth 3 (three) times daily. 90 tablet 1  . FUROSEMIDE 20 MG PO TABS Oral Take 1 tablet (20 mg total) by mouth daily. 90 tablet 1  . HYDROCODONE-ACETAMINOPHEN 5-325 MG PO TABS Oral Take 1 tablet by mouth every 4 (four) hours as needed for pain. 20 tablet 0  . METOPROLOL TARTRATE 25 MG PO TABS Oral Take 2 tablets (50 mg total) by mouth every evening. 180 tablet 1  .  POTASSIUM CHLORIDE ER 10 MEQ PO TBCR Oral Take 1 tablet (10 mEq total) by mouth daily. 90 tablet 1    BP 139/88  Pulse 114  Temp(Src) 103.2 F (39.6 C) (Rectal)  Resp 22  SpO2 95%  Physical Exam  Constitutional: She appears well-developed and well-nourished.  HENT:  Head: Normocephalic.  Eyes: Pupils are equal, round, and reactive to light.  Neck: Normal range of motion.  Cardiovascular: Tachycardia present.   Pulmonary/Chest: Effort normal. She has no wheezes. She has no rales. She exhibits no tenderness.  Abdominal: Soft. She exhibits no distension. There is no tenderness.  Musculoskeletal: Normal range of motion.  Neurological: She is alert.  Skin: Skin is warm and dry. There is pallor.    ED Course  Procedures (including critical care time)  Labs Reviewed  CBC - Abnormal; Notable for the following:    RBC 3.73 (*)    Hemoglobin 11.7 (*)    HCT  34.9 (*)    All other components within normal limits  DIFFERENTIAL - Abnormal; Notable for the following:    Monocytes Relative 13 (*)    Monocytes Absolute 1.3 (*)    All other components within normal limits  DIGOXIN LEVEL - Abnormal; Notable for the following:    Digoxin Level 0.7 (*)    All other components within normal limits  BASIC METABOLIC PANEL - Abnormal; Notable for the following:    Sodium 131 (*)    Potassium 3.4 (*)    Chloride 93 (*)    Glucose, Bld 167 (*)    Creatinine, Ser 1.26 (*)    GFR calc non Af Amer 35 (*)    GFR calc Af Amer 40 (*)    All other components within normal limits   Dg Chest 2 View  10/13/2011  *RADIOLOGY REPORT*  Clinical Data: Cough, congestion, chest pain, hypertension.  CHEST - 2 VIEW  Comparison: 08/01/2007  Findings: Cardiomegaly.  Aortic arch atherosclerosis.  Central vascular congestion.  Mild retrocardiac opacity.  Mild interstitial prominence.  Diffuse osteopenia.  IMPRESSION: Cardiomegaly with central vascular congestion. Mild interstitial edema not excluded.  Retrocardiac opacity; atelectasis, aspiration, or pneumonia.  Original Report Authenticated By: Waneta Martins, M.D.     No diagnosis found.    MDM  Most likely pneumonia         Arman Filter, NP 10/13/11 2310

## 2011-10-13 NOTE — ED Provider Notes (Signed)
Medical screening examination/treatment/procedure(s) were conducted as a shared visit with non-physician practitioner(s) and myself.  I personally evaluated the patient during the encounter   Loren Racer, MD 10/13/11 [phone]

## 2011-10-13 NOTE — ED Notes (Signed)
NP at bedside.

## 2011-10-13 NOTE — H&P (Signed)
PCP:   Willow Ora, MD, MD  Neurologist=  Chief Complaint:   78 y/o CF staretd on Wednesday 5/15 with a fever and then gradually got worse this was assosciated with a HA at the time has a h/o cluster HEadaches.  Developed a cough and congestion.  Was at her great-Niece's graduation at [facility] Tuesday night and tmaybe some of the children there were sick-yellow sputum.  Has had some SOB, and has usually some element of LE edema.  deneis specific CPat present. States her headaches have worsened with the fever  No Nausea or vomiting Appetite seems diminished Review of Systems:  The patient denies unilateral weakness, blurred vision, double vision, seizure, loss of consciousness, dark stool, scars stool, dysuria, positive for cough and expectorated sputum. Does have some incontinence of urine which takes her Lasix.  Past Medical History: Past Medical History  Diagnosis Date  . Hypertension   . Osteopenia   . Atrial fibrillation     not coumadin candidate  . Impaired gait   . Gait abnormality     imbalance  . Urinary incontinence   . Diarrhea     chronic  . Barrett's esophagus   . Osteoarthritis   . Edema leg     chronic, LEFT leg, u/s 2009 neg for DVT  Cluster Headaches Rectal/Vag prolapse H/o Perirectal Biospy-benign   Past surgical history: Past Surgical History  Procedure Date  . Cholecystectomy     had pancreatitis  . Hysterectomy-unknown     Medications: Prior to Admission medications   Medication Sig Start Date End Date Taking? Authorizing Provider  aspirin 325 MG tablet Take 325 mg by mouth daily.     Yes Historical Provider, MD  Calcium Polycarbophil (FIBER) 625 MG TABS Take by mouth daily.     Yes Historical Provider, MD  digoxin (LANOXIN) 0.125 MG tablet Take 1 tablet (125 mcg total) by mouth daily. 04/24/11  Yes [name], MD  diltiazem (CARDIZEM) 90 MG tablet Take 1 tablet (90 mg total) by mouth 3 (three) times daily. 09/26/11  Yes [name], MD    furosemide (LASIX) 20 MG tablet Take 1 tablet (20 mg total) by mouth daily. 06/26/11  Yes [name], MD  HYDROcodone-acetaminophen (NORCO) 5-325 MG per tablet Take 1 tablet by mouth every 4 (four) hours as needed for pain. 07/18/11 07/17/12 Yes [name], MD  metoprolol tartrate (LOPRESSOR) 25 MG tablet Take 2 tablets (50 mg total) by mouth every evening. 04/24/11  Yes [name], MD  potassium chloride (K-DUR) 10 MEQ tablet Take 1 tablet (10 mEq total) by mouth daily. 07/04/11  Yes [name], MD    Allergies:  No Known Allergies  Social History:  reports that she has never smoked. She does not have any smokeless tobacco history on file. She reports that she does not drink alcohol or use illicit drugs.  Family History: No family history on file.  Physical Exam: Filed Vitals:   10/13/11 2119 10/13/11 2200 10/13/11 2226 10/13/11 2230  BP:  139/88  130/75  Pulse:  114  107  Temp:   99.6 F (37.6 C)   TempSrc:   Oral   Resp:  22  20  SpO2: 95%       HEENT-alert oriented pleasant Caucasian female in no apparent distress CHEST-clinically clear no added sound, but Dr. vocal resonance and fremitus however there does seem to be some increase in possible rails and left lower lung fields posteriorly CARDS-S1-S2 no  murmur rub or gallop ABD-soft nontender nonno rash distended no rebound or guarding SKIN-no rash or lower extremity edema she does have seborrheic keratoses all over the face and back NEURO-alert, oriented x3 speech: normal in context and clarity memory: intact grossly cranial nerves II-XII: intact motor strength: full proximally and distally sensation: intact to vibration, pain, and light touch cerebellar: finger-to-nose and heel-to-shin intact reflexes: full and symmetric plantar responses: downgoing bilaterally   Labs on Admission:   [facility] 10/13/11 2130  NA 131*  K 3.4*  CL 93*  CO2 28  GLUCOSE 167*  BUN 16  CREATININE 1.26*  CALCIUM 8.6  MG --  PHOS --    No results found for this basename: AST:2,ALT:2,ALKPHOS:2,BILITOT:2,PROT:2,ALBUMIN:2 in the last 72 hours No results found for this basename: LIPASE:2,AMYLASE:2 in the last 72 hours  Basename 10/13/11 2130  WBC 10.2  NEUTROABS 7.7  HGB 11.7*  HCT 34.9*  MCV 93.6  PLT 158   No results found for this basename: CKTOTAL:3,CKMB:3,CKMBINDEX:3,TROPONINI:3 in the last 72 hours No results found for this basename: TSH,T4TOTAL,FREET3,T3FREE,THYROIDAB in the last 72 hours No results found for this basename: VITAMINB12:2,FOLATE:2,FERRITIN:2,TIBC:2,IRON:2,RETICCTPCT:2 in the last 72 hours  Radiological Exams on Admission: Dg Chest 2 View  10/13/2011  *RADIOLOGY REPORT*  Clinical Data: Cough, congestion, chest pain, hypertension.  CHEST - 2 VIEW  Comparison: 08/01/2007  Findings: Cardiomegaly.  Aortic arch atherosclerosis.  Central vascular congestion.  Mild retrocardiac opacity.  Mild interstitial prominence.  Diffuse osteopenia.  IMPRESSION: Cardiomegaly with central vascular congestion. Mild interstitial edema not excluded.  Retrocardiac opacity; atelectasis, aspiration, or pneumonia.  Original Report Authenticated By: Waneta Martins, M.D.    Assessment/Plan 1-community-acquired pneumonia-PSI score=111, class 4-needs inpatient management- continue Rocephin and azithromycin started the emergency room. She's not hypertensive does not meet service criteria and has no white count however her temperature was 103 on admission and she had her sats in the low 90s in the emergency room and had an oxygen requirement which was not there before. I suspect she will do well however she will need mobility that his sedation and will need to be checked off and on her oxygen in the morning-she might be able to be discharged home in 1-2 days with transition to by mouth antibiotics in the interim we will get sputum culture blood culture Legionella antigen and strep pneumo antigen and CBC with differential in the  morning Given her age, she will need a repeat chest x-ray 4-6 weeks to denote clearing of this infiltrate  2-atrial fibrillation rate controlled [place] score=3, not on Coumadin but wished aspirin on discussion with Dr. Berna Bue 10/2010 -continue Cardizem 90 mg 3 times daily. Continue digoxin 0.125 mg by mouth daily. As this is in therapeutic range medication might need to get digoxin level at some point. Can be done as an outpatient. Continue metoprolol 100 mg every afternoon  3-cluster headaches-not currently on any prophylactic medications. Continue Norco as needed for pain.  4-likely CHF-last echocardiogram showed EF 55-60% with equivocal and inadequate study for left ventricular regional wall motion-hold Lasix for now given she has a creatinine of 1.2 and this dosage may need to be individualized as an outpatient   5-chronic kidney disease stage II to 3-will keep euvolemic for now and not give fluids nor diuresis. She also has some mild hyponatremia and mild hypo-kalemia which may be related to the use of her Lasix. She is on potassium supplementation at home.   6-impaired glucose tolerance-unclear if she has diabetes or not. Would hold  on further management unless her blood sugars elevated above 200. Outpatient reassessment needed.  DO NOT RESUSCITATE Both daughters number is in history section-healthcare power of attorney out of town but will return tomorrow. Daughter at bedside and stance course of care Admitted to Dr. Orrin Brigham long team 4 >40 min [facility] 10/13/2011, 11:15 PM

## 2011-10-13 NOTE — ED Notes (Signed)
Pt c/o productive cough x 2-3 days

## 2011-10-13 NOTE — ED Notes (Signed)
Attempted to call rn, unavlaible at this time

## 2011-10-14 DIAGNOSIS — I4891 Unspecified atrial fibrillation: Secondary | ICD-10-CM

## 2011-10-14 DIAGNOSIS — E871 Hypo-osmolality and hyponatremia: Secondary | ICD-10-CM | POA: Diagnosis present

## 2011-10-14 DIAGNOSIS — J9601 Acute respiratory failure with hypoxia: Secondary | ICD-10-CM | POA: Diagnosis present

## 2011-10-14 DIAGNOSIS — N182 Chronic kidney disease, stage 2 (mild): Secondary | ICD-10-CM

## 2011-10-14 DIAGNOSIS — J189 Pneumonia, unspecified organism: Secondary | ICD-10-CM | POA: Diagnosis present

## 2011-10-14 DIAGNOSIS — J159 Unspecified bacterial pneumonia: Secondary | ICD-10-CM

## 2011-10-14 DIAGNOSIS — E876 Hypokalemia: Secondary | ICD-10-CM

## 2011-10-14 LAB — DIFFERENTIAL
Basophils Absolute: 0 10*3/uL (ref 0.0–0.1)
Basophils Relative: 0 % (ref 0–1)
Eosinophils Absolute: 0 10*3/uL (ref 0.0–0.7)
Monocytes Relative: 13 % — ABNORMAL HIGH (ref 3–12)
Neutro Abs: 6.9 10*3/uL (ref 1.7–7.7)
Neutrophils Relative %: 74 % (ref 43–77)

## 2011-10-14 LAB — EXPECTORATED SPUTUM ASSESSMENT W GRAM STAIN, RFLX TO RESP C

## 2011-10-14 LAB — CBC
Hemoglobin: 10.9 g/dL — ABNORMAL LOW (ref 12.0–15.0)
MCH: 30.9 pg (ref 26.0–34.0)
MCHC: 33.1 g/dL (ref 30.0–36.0)
Platelets: 141 10*3/uL — ABNORMAL LOW (ref 150–400)
RDW: 14.5 % (ref 11.5–15.5)

## 2011-10-14 LAB — BASIC METABOLIC PANEL
Calcium: 8.2 mg/dL — ABNORMAL LOW (ref 8.4–10.5)
Creatinine, Ser: 1.11 mg/dL — ABNORMAL HIGH (ref 0.50–1.10)
GFR calc non Af Amer: 41 mL/min — ABNORMAL LOW (ref >90)
Sodium: 134 mEq/L — ABNORMAL LOW (ref 135–145)

## 2011-10-14 LAB — STREP PNEUMONIAE URINARY ANTIGEN: Strep Pneumo Urinary Antigen: NEGATIVE

## 2011-10-14 LAB — HIV ANTIBODY (ROUTINE TESTING W REFLEX): HIV: NONREACTIVE

## 2011-10-14 MED ORDER — DIGOXIN 125 MCG PO TABS
125.0000 ug | ORAL_TABLET | Freq: Every day | ORAL | Status: DC
  Administered 2011-10-14 – 2011-10-16 (×3): 125 ug via ORAL
  Filled 2011-10-14 (×3): qty 1

## 2011-10-14 MED ORDER — POTASSIUM CHLORIDE ER 10 MEQ PO TBCR
10.0000 meq | EXTENDED_RELEASE_TABLET | Freq: Every day | ORAL | Status: DC
  Administered 2011-10-14 – 2011-10-16 (×3): 10 meq via ORAL
  Filled 2011-10-14 (×3): qty 1

## 2011-10-14 MED ORDER — HYDROCODONE-ACETAMINOPHEN 5-325 MG PO TABS: 0.5000 | ORAL_TABLET | ORAL | Status: DC | PRN

## 2011-10-14 MED ORDER — AZITHROMYCIN 500 MG PO TABS
500.0000 mg | ORAL_TABLET | Freq: Every day | ORAL | Status: DC
  Administered 2011-10-14 – 2011-10-15 (×2): 500 mg via ORAL
  Filled 2011-10-14 (×3): qty 1

## 2011-10-14 MED ORDER — METOPROLOL TARTRATE 50 MG PO TABS
50.0000 mg | ORAL_TABLET | Freq: Every evening | ORAL | Status: DC
  Administered 2011-10-14 – 2011-10-15 (×2): 50 mg via ORAL
  Filled 2011-10-14 (×3): qty 1

## 2011-10-14 MED ORDER — TRAMADOL HCL 50 MG PO TABS
50.0000 mg | ORAL_TABLET | Freq: Four times a day (QID) | ORAL | Status: DC | PRN
  Administered 2011-10-14 – 2011-10-15 (×3): 50 mg via ORAL
  Filled 2011-10-14 (×3): qty 1

## 2011-10-14 MED ORDER — DEXTROSE 5 % IV SOLN
1.0000 g | Freq: Every day | INTRAVENOUS | Status: DC
  Administered 2011-10-14: 1 g via INTRAVENOUS
  Filled 2011-10-14: qty 10

## 2011-10-14 MED ORDER — HEPARIN SODIUM (PORCINE) 5000 UNIT/ML IJ SOLN
5000.0000 [IU] | Freq: Three times a day (TID) | INTRAMUSCULAR | Status: DC
  Administered 2011-10-14 – 2011-10-15 (×6): 5000 [IU] via SUBCUTANEOUS
  Filled 2011-10-14 (×11): qty 1

## 2011-10-14 MED ORDER — HYDROCODONE-ACETAMINOPHEN 5-325 MG PO TABS: 1.0000 | ORAL_TABLET | ORAL | Status: DC | PRN

## 2011-10-14 MED ORDER — POTASSIUM CHLORIDE CRYS ER 20 MEQ PO TBCR
20.0000 meq | EXTENDED_RELEASE_TABLET | Freq: Once | ORAL | Status: AC
  Administered 2011-10-14: 20 meq via ORAL
  Filled 2011-10-14: qty 1

## 2011-10-14 MED ORDER — ASPIRIN 325 MG PO TABS
325.0000 mg | ORAL_TABLET | Freq: Every day | ORAL | Status: DC
Start: 2011-10-14 — End: 2011-10-16
  Administered 2011-10-14 – 2011-10-16 (×3): 325 mg via ORAL
  Filled 2011-10-14 (×3): qty 1

## 2011-10-14 MED ORDER — DILTIAZEM HCL 90 MG PO TABS
90.0000 mg | ORAL_TABLET | Freq: Three times a day (TID) | ORAL | Status: DC
  Administered 2011-10-14 – 2011-10-16 (×7): 90 mg via ORAL
  Filled 2011-10-14 (×9): qty 1

## 2011-10-14 NOTE — Evaluation (Signed)
Occupational Therapy Evaluation Patient Details Name: Sylvia Duncan MRN: [id number] DOB: 20-Dec-1912 Today's Date: 10/14/2011 Time: 4098-1191 OT Time Calculation (min): 39 min  OT Assessment / Plan / Recommendation Clinical Impression  Pt admitted with PNA and presents with generalized weakeness and fatigue limiting safety and independence. Pt will benefit from skilled OT in the acute setting to maximize I with ADL and ADL mobility prior to d/c    OT Assessment  Patient needs continued OT Services    Follow Up Recommendations  Skilled nursing facility    Barriers to Discharge Decreased caregiver support    Equipment Recommendations  Defer to next venue    Recommendations for Other Services    Frequency  Min 1X/week    Precautions / Restrictions Precautions Precautions: Fall Restrictions Weight Bearing Restrictions: No   Pertinent Vitals/Pain Pt c/o HA- did not rate and declined medication    ADL  Eating/Feeding: Simulated;Set up;Supervision/safety Where Assessed - Eating/Feeding: Chair Grooming: Performed;Wash/dry face;Wash/dry hands;Teeth care;Supervision/safety;Set up Where Assessed - Grooming: Unsupported standing Upper Body Bathing: Simulated;Minimal assistance Where Assessed - Upper Body Bathing: Unsupported sitting Lower Body Bathing: Simulated;Maximal assistance Where Assessed - Lower Body Bathing: Supported sit to stand Upper Body Dressing: Simulated;Minimal assistance Where Assessed - Upper Body Dressing: Unsupported sitting Lower Body Dressing: Performed;Maximal assistance Where Assessed - Lower Body Dressing: Sopported sit to stand Toilet Transfer: Performed;Minimal assistance Toilet Transfer Method: Sit to Barista: Bedside commode Toileting - Clothing Manipulation and Hygiene: Performed;Min guard Where Assessed - Engineer, mining and Hygiene: Standing Equipment Used: Gait belt;Rolling walker Transfers/Ambulation  Related to ADLs: Min A with RW ambulation. Slow gait speed ADL Comments: Pt incontinent of bladder.     OT Diagnosis: Generalized weakness;Acute pain  OT Problem List: Decreased strength;Decreased activity tolerance;Decreased knowledge of use of DME or AE;Decreased knowledge of precautions;Pain;Impaired balance (sitting and/or standing) OT Treatment Interventions: Self-care/ADL training;DME and/or AE instruction;Patient/family education;Therapeutic activities   OT Goals Acute Rehab OT Goals OT Goal Formulation: With patient Time For Goal Achievement: 10/28/11 Potential to Achieve Goals: Good ADL Goals Pt Will Perform Upper Body Dressing: Sitting, bed;Sitting, chair;Independently ADL Goal: Upper Body Dressing - Progress: Goal set today Pt Will Perform Lower Body Dressing: with min assist;Sit to stand from bed;Sit to stand from chair ADL Goal: Lower Body Dressing - Progress: Goal set today Pt Will Transfer to Toilet: with modified independence;Ambulation;with DME;3-in-1 ADL Goal: Toilet Transfer - Progress: Goal set today Pt Will Perform Toileting - Clothing Manipulation: Independently;Standing ADL Goal: Toileting - Clothing Manipulation - Progress: Goal set today Pt Will Perform Toileting - Hygiene: Independently;Sitting on 3-in-1 or toilet;Sit to stand from 3-in-1/toilet;with modified independence ADL Goal: Toileting - Hygiene - Progress: Goal set today  Visit Information  Last OT Received On: 10/14/11 Assistance Needed: +1    Subjective Data      Prior Functioning  Home Living Lives With: Alone Available Help at Discharge: Personal care attendant (4hrs/day 9a-1p) Type of Home:  (condo) Home Access: Level entry Home Layout: One level Bathroom Shower/Tub: Walk-in shower;Door Bathroom Toilet: Standard Bathroom Accessibility: Yes Home Adaptive Equipment: Grab bars around toilet;Grab bars in shower;Shower chair with back;Walker - four wheeled;Walker - rolling Additional  Comments: uses rollator most often Prior Function Level of Independence: Needs assistance Needs Assistance: Light Housekeeping;Dressing;Bathing Bath: Supervision/set-up Dressing: Minimal Light Housekeeping: Maximal Driving: No Vocation: Retired Musician: HOH Dominant Hand: Right    Cognition  Overall Cognitive Status: Appears within functional limits for tasks assessed/performed Arousal/Alertness: Awake/alert Orientation Level: Appears intact  for tasks assessed Behavior During Session: [facility] for tasks performed Cognition - Other Comments: appears slow to process, but is able to respond appropriately to all questions when given time. May be related to pt is [facility]    Extremity/Trunk Assessment Right Upper Extremity Assessment RUE ROM/Strength/Tone: Within functional levels RUE Sensation: WFL - Light Touch RUE Coordination: WFL - gross/fine motor Left Upper Extremity Assessment LUE ROM/Strength/Tone: Within functional levels LUE Sensation: WFL - Light Touch LUE Coordination: WFL - gross/fine motor   Mobility Bed Mobility Supine to Sit: 4: Min assist;With rails;HOB flat   Exercise    Balance    End of Session OT - End of Session Equipment Utilized During Treatment: Gait belt Activity Tolerance: Patient tolerated treatment well Patient left: in chair;with call bell/phone within reach Nurse Communication: Mobility status   Eyanna Mcgonagle 10/14/2011, 11:38 AM

## 2011-10-14 NOTE — Progress Notes (Addendum)
Subjective: Patient feels like her breathing is improved from yesterday.  Complaining of left-sided face pain.  Objective: Vital signs in last 24 hours: Filed Vitals:   10/13/11 2230 10/13/11 2330 10/14/11 0030 10/14/11 0645  BP: 130/75 126/69 116/71 127/72  Pulse: 107 105 100 96  Temp:   98.8 F (37.1 C) 98.6 F (37 C)  TempSrc:   Oral Oral  Resp: 20 21 20 20   Height:   5\' 4"  (1.626 m)   Weight:   59.7 kg (131 lb 9.8 oz)   SpO2:   95% 95%   Weight change:  No intake or output data in the 24 hours ending 10/14/11 1031  Physical Exam: General: Awake, Oriented, No acute distress. HEENT: EOMI. Neck: Supple CV: S1 and S2 Lungs: Coarse breath sounds bilaterally Abdomen: Soft, Nontender, Nondistended, +bowel sounds. Ext: Good pulses. Trace edema.  Lab Results:  [facility] 10/14/11 0214 10/13/11 2130  NA 134* 131*  K 3.2* 3.4*  CL 97 93*  CO2 26 28  GLUCOSE 154* 167*  BUN 13 16  CREATININE 1.11* 1.26*  CALCIUM 8.2* 8.6  MG -- --  PHOS -- --   No results found for this basename: AST:2,ALT:2,ALKPHOS:2,BILITOT:2,PROT:2,ALBUMIN:2 in the last 72 hours No results found for this basename: LIPASE:2,AMYLASE:2 in the last 72 hours  Basename 10/14/11 0214 10/13/11 2130  WBC 9.4 10.2  NEUTROABS 6.9 7.7  HGB 10.9* 11.7*  HCT 32.9* 34.9*  MCV 93.2 93.6  PLT 141* 158   No results found for this basename: CKTOTAL:3,CKMB:3,CKMBINDEX:3,TROPONINI:3 in the last 72 hours No components found with this basename: POCBNP:3 No results found for this basename: DDIMER:2 in the last 72 hours No results found for this basename: HGBA1C:2 in the last 72 hours No results found for this basename: CHOL:2,HDL:2,LDLCALC:2,TRIG:2,CHOLHDL:2,LDLDIRECT:2 in the last 72 hours No results found for this basename: TSH,T4TOTAL,FREET3,T3FREE,THYROIDAB in the last 72 hours No results found for this basename: VITAMINB12:2,FOLATE:2,FERRITIN:2,TIBC:2,IRON:2,RETICCTPCT:2 in the last 72 hours  Micro Results: No  results found for this or any previous visit (from the past 240 hour(s)).  Studies/Results: Dg Chest 2 View  10/13/2011  *RADIOLOGY REPORT*  Clinical Data: Cough, congestion, chest pain, hypertension.  CHEST - 2 VIEW  Comparison: 08/01/2007  Findings: Cardiomegaly.  Aortic arch atherosclerosis.  Central vascular congestion.  Mild retrocardiac opacity.  Mild interstitial prominence.  Diffuse osteopenia.  IMPRESSION: Cardiomegaly with central vascular congestion. Mild interstitial edema not excluded.  Retrocardiac opacity; atelectasis, aspiration, or pneumonia.  Original Report Authenticated By: Waneta Martins, M.D.    Medications: I have reviewed the patient's current medications. Scheduled Meds:   . sodium chloride   Intravenous Once  . acetaminophen  650 mg Oral Once  . aspirin  325 mg Oral Daily  . azithromycin  500 mg Intravenous Once  . azithromycin  500 mg Oral QHS  . cefTRIAXone (ROCEPHIN)  IV  1 g Intravenous Once  . cefTRIAXone (ROCEPHIN)  IV  1 g Intravenous QHS  . digoxin  125 mcg Oral Daily  . diltiazem  90 mg Oral TID  . heparin  5,000 Units Subcutaneous Q8H  . metoprolol tartrate  50 mg Oral QPM  .  morphine injection  2 mg Intravenous Once  . potassium chloride  10 mEq Oral Daily  . potassium chloride  20 mEq Oral Once   Continuous Infusions:  PRN Meds:.HYDROcodone-acetaminophen, traMADol, DISCONTD: HYDROcodone-acetaminophen  Assessment/Plan: Acute respiratory failure with hypoxia secondary to community-acquired pneumonia Continue ceftriaxone and azithromycin.  Antibiotics since 10/13/2011.  Continue to wean the  patient off oxygen as tolerated.  Patient this morning off oxygen was saturating in the mid-80s.  Blood culture is pending.  Urine Legionella and strep pneumoniae pending.  Likely will need repeat chest x-ray in 4-6 weeks to monitor resolution.  Will request speech therapy to evaluate swallowing.  Chronic atrial fibrillation Rate controlled.  Per discussion  with her cardiologist Dr. Antoine Poche, patient did not want anticoagulation.  Wanted to continue only aspirin.  Continue Cardizem 90 mg 3 times daily. Continue digoxin 0.125 mg by mouth daily. Continue metoprolol 100 mg.  Cluster headaches Patient's left-sided face pain is likely due to cluster headaches.  Not currently on any prophylactic medications. Continue Norco as needed for pain.   Chronic kidney disease stage II Stable, improved with gentle hydration.  Furosemide held.  Depending on patient's creatinine consider restarting Lasix in the next one to 2 days.  Saline lock IV.  Will keep euvolemic for now and not give fluids nor diuresis.   Hypokalemia  Replace as needed.  Hyponatremia  Likely due to Lasix.  Continue monitor.  Improved.    Anemia Etiology unclear, normocytic.  Send anemia panel in the morning.  Impaired glucose tolerance  Hemoglobin A1c on 07/30/2007 was 6.3.  Given patient's age would manage conservatively.  Further management as outpatient.    CODE STATUS DO NOT RESUSCITATE/DO NOT INTUBATE.  Disposition Pending.  Will have PT/OT evaluation.   LOS: 1 day  Kenechukwu Eckstein A, MD 10/14/2011, 10:31 AM

## 2011-10-14 NOTE — Clinical Social Work Placement (Signed)
Daughter HCPOA  Jeanella Anton would like to speak with social worker about possible placement in SNF. Sharrill's number is [phone]

## 2011-10-15 DIAGNOSIS — I4891 Unspecified atrial fibrillation: Secondary | ICD-10-CM

## 2011-10-15 DIAGNOSIS — N182 Chronic kidney disease, stage 2 (mild): Secondary | ICD-10-CM

## 2011-10-15 DIAGNOSIS — J159 Unspecified bacterial pneumonia: Secondary | ICD-10-CM

## 2011-10-15 DIAGNOSIS — E876 Hypokalemia: Secondary | ICD-10-CM

## 2011-10-15 LAB — RETICULOCYTES
Retic Count, Absolute: 49.8 10*3/uL (ref 19.0–186.0)
Retic Ct Pct: 1.4 % (ref 0.4–3.1)

## 2011-10-15 LAB — BASIC METABOLIC PANEL
BUN: 19 mg/dL (ref 6–23)
Calcium: 8.6 mg/dL (ref 8.4–10.5)
Creatinine, Ser: 1.09 mg/dL (ref 0.50–1.10)
GFR calc Af Amer: 48 mL/min — ABNORMAL LOW (ref >90)

## 2011-10-15 LAB — CBC
HCT: 33.6 % — ABNORMAL LOW (ref 36.0–46.0)
MCH: 30.9 pg (ref 26.0–34.0)
MCHC: 32.7 g/dL (ref 30.0–36.0)
MCV: 94.4 fL (ref 78.0–100.0)
Platelets: 152 10*3/uL (ref 150–400)
RDW: 14.6 % (ref 11.5–15.5)

## 2011-10-15 LAB — MAGNESIUM: Magnesium: 2.2 mg/dL (ref 1.5–2.5)

## 2011-10-15 LAB — FERRITIN: Ferritin: 284 ng/mL (ref 10–291)

## 2011-10-15 LAB — IRON AND TIBC
Iron: 21 ug/dL — ABNORMAL LOW (ref 42–135)
UIBC: 212 ug/dL (ref 125–400)

## 2011-10-15 LAB — LEGIONELLA ANTIGEN, URINE

## 2011-10-15 MED ORDER — GUAIFENESIN ER 600 MG PO TB12
600.0000 mg | ORAL_TABLET | Freq: Two times a day (BID) | ORAL | Status: DC
  Administered 2011-10-15 – 2011-10-16 (×3): 600 mg via ORAL
  Filled 2011-10-15 (×4): qty 1

## 2011-10-15 MED ORDER — ONDANSETRON HCL 4 MG/2ML IJ SOLN
4.0000 mg | Freq: Four times a day (QID) | INTRAMUSCULAR | Status: DC | PRN
  Administered 2011-10-15: 4 mg via INTRAVENOUS
  Filled 2011-10-15: qty 2

## 2011-10-15 MED ORDER — CEFUROXIME AXETIL 500 MG PO TABS
500.0000 mg | ORAL_TABLET | Freq: Two times a day (BID) | ORAL | Status: DC
  Administered 2011-10-15 – 2011-10-16 (×2): 500 mg via ORAL
  Filled 2011-10-15 (×4): qty 1

## 2011-10-15 MED ORDER — FUROSEMIDE 20 MG PO TABS
20.0000 mg | ORAL_TABLET | Freq: Every day | ORAL | Status: DC
  Administered 2011-10-15 – 2011-10-16 (×2): 20 mg via ORAL
  Filled 2011-10-15 (×2): qty 1

## 2011-10-15 MED ORDER — HALOPERIDOL LACTATE 5 MG/ML IJ SOLN: 2.0000 mg | Freq: Four times a day (QID) | INTRAMUSCULAR | Status: DC | PRN

## 2011-10-15 MED ORDER — HALOPERIDOL LACTATE 5 MG/ML IJ SOLN
2.0000 mg | Freq: Four times a day (QID) | INTRAMUSCULAR | Status: DC | PRN
  Filled 2011-10-15: qty 1

## 2011-10-15 NOTE — Progress Notes (Addendum)
Clinical Social Work Department BRIEF PSYCHOSOCIAL ASSESSMENT 10/15/2011  Patient:  Sylvia Duncan, Sylvia Duncan     Account Number:  [id number]     Admit date:  10/13/2011  Clinical Social Worker:  Doroteo Glassman  Date/Time:  10/15/2011 01:05 PM  Referred by:  Physician  Date Referred:  10/15/2011 Referred for  SNF Placement   Other Referral:   Interview type:  Patient Other interview type:   Family    PSYCHOSOCIAL DATA Living Status:  ALONE Admitted from facility:   Level of care:   Primary support name:  Christell Constant & [facility] Primary support relationship to patient:  CHILD, ADULT Degree of support available:   Adequate    CURRENT CONCERNS Current Concerns  Post-Acute Placement   Other Concerns:    SOCIAL WORK ASSESSMENT / PLAN Pt amenable to SNF.   Assessment/plan status:  Psychosocial Support/Ongoing Assessment of Needs Other assessment/ plan:   Information/referral to community resources:   Provided Pt and family with [facility] SNF list.  Pt interested in [facility] [place] and [facility].    PATIENT'S/FAMILY'S RESPONSE TO PLAN OF CARE: Pt and family thanked CSW for time and assistance.  CSW to continue to follow.  Providence Crosby, LCSWA Clinical Social Work [phone]

## 2011-10-15 NOTE — Progress Notes (Addendum)
Clinical Social Work Department CLINICAL SOCIAL WORK PLACEMENT NOTE 10/15/2011  Patient:  Sylvia Duncan, Sylvia Duncan  Account Number:  [id number] Admit date:  10/13/2011  Clinical Social Worker:  Doroteo Glassman  Date/time:  10/15/2011 01:10 PM  Clinical Social Work is seeking post-discharge placement for this patient at the following level of care:   SKILLED NURSING   (*CSW will update this form in Epic as items are completed)   10/15/2011  Patient/family provided with [facility] Department of Clinical Social Work's list of facilities offering this level of care within the geographic area requested by the patient (or if unable, by the patient's family).  10/15/2011  Patient/family informed of their freedom to choose among providers that offer the needed level of care, that participate in Medicare, Medicaid or managed care program needed by the patient, have an available bed and are willing to accept the patient.  10/15/2011  Patient/family informed of [facility]' ownership interest in [facility], as well as of the fact that they are under no obligation to receive care at this facility.  PASARR submitted to EDS on 10/15/2011 PASARR number received from EDS on 10/16/2011  FL2 transmitted to all facilities in geographic area requested by pt/family on  10/15/2011 FL2 transmitted to all facilities within larger geographic area on   Patient informed that his/her managed care company has contracts with or will negotiate with  certain facilities, including the following:     Patient/family informed of bed offers received:  10/16/2011 Patient chooses bed at [facility] Physician recommends and patient chooses bed at    Patient to be transferred to  on   Patient to be transferred to facility by   The following physician request were entered in Epic:   Additional Comments:  Providence Crosby, Theresia Majors Clinical Social Work [phone]

## 2011-10-15 NOTE — Progress Notes (Signed)
Subjective: Patient feels like her breathing is about same as yesterday.  Complaining of left-sided face pain due to her headaches.  Objective: Vital signs in last 24 hours: Filed Vitals:   10/14/11 1649 10/14/11 2234 10/15/11 0655 10/15/11 1008  BP: 100/66 123/73 128/71 150/78  Pulse: 78 55 67 78  Temp:  98.8 F (37.1 C) 98.6 F (37 C)   TempSrc:  Oral Oral   Resp:  20 20   Height:      Weight:      SpO2:  93% 93%    Weight change:   Intake/Output Summary (Last 24 hours) at 10/15/11 1131 Last data filed at 10/15/11 0700  Gross per 24 hour  Intake    240 ml  Output      0 ml  Net    240 ml    Physical Exam: General: Awake, Oriented, No acute distress. HEENT: EOMI. Neck: Supple CV: S1 and S2 Lungs: Coarse breath sounds bilaterally Abdomen: Soft, Nontender, Nondistended, +bowel sounds. Ext: Good pulses. Trace edema.  Lab Results:  [facility] 10/15/11 0555 10/14/11 0214  NA 137 134*  K 4.4 3.2*  CL 99 97  CO2 29 26  GLUCOSE 138* 154*  BUN 19 13  CREATININE 1.09 1.11*  CALCIUM 8.6 8.2*  MG 2.2 --  PHOS -- --   No results found for this basename: AST:2,ALT:2,ALKPHOS:2,BILITOT:2,PROT:2,ALBUMIN:2 in the last 72 hours No results found for this basename: LIPASE:2,AMYLASE:2 in the last 72 hours  Basename 10/15/11 0555 10/14/11 0214 10/13/11 2130  WBC 6.9 9.4 --  NEUTROABS -- 6.9 7.7  HGB 11.0* 10.9* --  HCT 33.6* 32.9* --  MCV 94.4 93.2 --  PLT 152 141* --   No results found for this basename: CKTOTAL:3,CKMB:3,CKMBINDEX:3,TROPONINI:3 in the last 72 hours No components found with this basename: POCBNP:3 No results found for this basename: DDIMER:2 in the last 72 hours No results found for this basename: HGBA1C:2 in the last 72 hours No results found for this basename: CHOL:2,HDL:2,LDLCALC:2,TRIG:2,CHOLHDL:2,LDLDIRECT:2 in the last 72 hours No results found for this basename: TSH,T4TOTAL,FREET3,T3FREE,THYROIDAB in the last 72 hours  Basename 10/15/11 0555    VITAMINB12 --  FOLATE --  FERRITIN --  TIBC --  IRON --  RETICCTPCT 1.4    Micro Results: Recent Results (from the past 240 hour(s))  CULTURE, BLOOD (ROUTINE X 2)     Status: Normal (Preliminary result)   Collection Time   10/13/11  9:30 PM      Component Value Range Status Comment   Specimen Description Blood   Final    Special Requests NONE   Final    Culture  Setup Time [id number]   Final    Culture     Final    Value:        BLOOD CULTURE RECEIVED NO GROWTH TO DATE CULTURE WILL BE HELD FOR 5 DAYS BEFORE ISSUING A FINAL NEGATIVE REPORT   Report Status PENDING   Incomplete   CULTURE, BLOOD (ROUTINE X 2)     Status: Normal (Preliminary result)   Collection Time   10/14/11  2:14 AM      Component Value Range Status Comment   Specimen Description Blood   Final    Special Requests NONE   Final    Culture  Setup Time [id number]   Final    Culture     Final    Value:        BLOOD CULTURE RECEIVED NO GROWTH TO DATE CULTURE WILL BE HELD  FOR 5 DAYS BEFORE ISSUING A FINAL NEGATIVE REPORT   Report Status PENDING   Incomplete   CULTURE, EXPECTORATED SPUTUM-ASSESSMENT     Status: Normal   Collection Time   10/14/11 10:03 AM      Component Value Range Status Comment   Specimen Description SPUTUM   Final    Special Requests NONE   Final    Sputum evaluation     Final    Value: THIS SPECIMEN IS ACCEPTABLE. RESPIRATORY CULTURE REPORT TO FOLLOW.   Report Status 10/14/2011 FINAL   Final   CULTURE, RESPIRATORY     Status: Normal (Preliminary result)   Collection Time   10/14/11 10:03 AM      Component Value Range Status Comment   Specimen Description SPUTUM   Final    Special Requests NONE   Final    Gram Stain PENDING   Incomplete    Culture Culture reincubated for better growth   Final    Report Status PENDING   Incomplete     Studies/Results: Dg Chest 2 View  10/13/2011  *RADIOLOGY REPORT*  Clinical Data: Cough, congestion, chest pain, hypertension.  CHEST - 2 VIEW  Comparison:  08/01/2007  Findings: Cardiomegaly.  Aortic arch atherosclerosis.  Central vascular congestion.  Mild retrocardiac opacity.  Mild interstitial prominence.  Diffuse osteopenia.  IMPRESSION: Cardiomegaly with central vascular congestion. Mild interstitial edema not excluded.  Retrocardiac opacity; atelectasis, aspiration, or pneumonia.  Original Report Authenticated By: Waneta Martins, M.D.    Medications: I have reviewed the patient's current medications. Scheduled Meds:    . aspirin  325 mg Oral Daily  . azithromycin  500 mg Oral QHS  . cefUROXime  500 mg Oral BID WC  . digoxin  125 mcg Oral Daily  . diltiazem  90 mg Oral TID  . guaiFENesin  600 mg Oral BID  . heparin  5,000 Units Subcutaneous Q8H  . metoprolol tartrate  50 mg Oral QPM  . potassium chloride  10 mEq Oral Daily  . DISCONTD: cefTRIAXone (ROCEPHIN)  IV  1 g Intravenous QHS   Continuous Infusions:  PRN Meds:.HYDROcodone-acetaminophen, ondansetron (ZOFRAN) IV, traMADol  Assessment/Plan: Acute respiratory failure with hypoxia secondary to community-acquired pneumonia Transition ceftriaxone to cefuroxime.  Continue azithromycin.  Antibiotics since 10/13/2011.  Continue to wean the patient off oxygen as tolerated.  Patient this morning off oxygen was saturating in the mid-80s.  Blood culture is pending.  Urine Legionella and strep pneumoniae pending.  Likely will need repeat chest x-ray in 4-6 weeks to monitor resolution.  Will request speech therapy to evaluate swallowing.  Chronic atrial fibrillation Rate controlled.  Per discussion with her cardiologist Dr. Antoine Poche as outpatient, patient did not want anticoagulation.  Wanted to continue only aspirin.  Continue Cardizem 90 mg 3 times daily. Continue digoxin 0.125 mg by mouth daily. Continue metoprolol 50 mg qAM.  Cluster headaches Patient's left-sided face pain is likely due to cluster headaches.  Not currently on any prophylactic medications. Continue Norco as needed for  pain.   Chronic kidney disease stage II Stable, improved with gentle hydration.  Restart furosemide today.  Saline lock IV.  Will keep euvolemic for now.   Hypokalemia  Repleted.  Hyponatremia  Likely due to Lasix.  Continue monitor.  Improved.    Anemia Etiology unclear, normocytic.  Anemia panel pending.  Impaired glucose tolerance  Hemoglobin A1c on 07/30/2007 was 6.3.  Given patient's age would manage conservatively.  Further management as outpatient.    CODE STATUS  DO NOT RESUSCITATE/DO NOT INTUBATE.  Disposition Pending.  Patient will likely benefit from skilled nursing facility for rehabilitation.   LOS: 2 days  Aleksandr Pellow A, MD 10/15/2011, 11:31 AM

## 2011-10-15 NOTE — Progress Notes (Signed)
SLP Cancellation Note  ST received order for BSE. Defer evaluation to 10/16/11. RN made aware.  Moreen Fowler MS, CCC-SLP [phone]  [facility] 10/15/2011, 5:28 PM

## 2011-10-16 DIAGNOSIS — J159 Unspecified bacterial pneumonia: Secondary | ICD-10-CM

## 2011-10-16 DIAGNOSIS — E876 Hypokalemia: Secondary | ICD-10-CM

## 2011-10-16 DIAGNOSIS — I4891 Unspecified atrial fibrillation: Secondary | ICD-10-CM

## 2011-10-16 DIAGNOSIS — N182 Chronic kidney disease, stage 2 (mild): Secondary | ICD-10-CM

## 2011-10-16 LAB — CULTURE, RESPIRATORY W GRAM STAIN

## 2011-10-16 MED ORDER — POLYSACCHARIDE IRON COMPLEX 150 MG PO CAPS: 150.0000 mg | ORAL_CAPSULE | Freq: Every day | ORAL | Status: DC

## 2011-10-16 MED ORDER — AZITHROMYCIN 500 MG PO TABS: 500.0000 mg | ORAL_TABLET | Freq: Every day | ORAL | Status: AC

## 2011-10-16 MED ORDER — HYDROCODONE-ACETAMINOPHEN 5-325 MG PO TABS: 1.0000 | ORAL_TABLET | ORAL | Status: DC | PRN

## 2011-10-16 MED ORDER — GUAIFENESIN ER 600 MG PO TB12: 600.0000 mg | ORAL_TABLET | Freq: Two times a day (BID) | ORAL | Status: DC

## 2011-10-16 MED ORDER — POLYSACCHARIDE IRON COMPLEX 150 MG PO CAPS
150.0000 mg | ORAL_CAPSULE | Freq: Every day | ORAL | Status: DC
  Administered 2011-10-16: 150 mg via ORAL
  Filled 2011-10-16: qty 1

## 2011-10-16 MED ORDER — CEFUROXIME AXETIL 500 MG PO TABS: 500.0000 mg | ORAL_TABLET | Freq: Two times a day (BID) | ORAL | Status: AC

## 2011-10-16 MED ORDER — TRAMADOL HCL 50 MG PO TABS: 50.0000 mg | ORAL_TABLET | Freq: Four times a day (QID) | ORAL | Status: AC | PRN

## 2011-10-16 NOTE — Progress Notes (Signed)
Patient discharged to SNF, alert and oriented, discharge orders given to patient and family member (Daughter), verbalize understanding, package given to daughter for delivery to SNF, patient in stable condition at this time

## 2011-10-16 NOTE — Progress Notes (Signed)
Subjective: Per daughter patient had episode of confusion during the night patient is oriented x3 this morning, patient likely had sundowning.  Speech therapy also assessed patient, patient has a history of Zenker's diverticulum with history of aspiration in 2009 with thin liquids.  Discussed risks and benefits, given patient's poor appetite she does not want to have her taste compromise, and she understands the risk of aspiration with thin liquids.  Objective: Vital signs in last 24 hours: Filed Vitals:   10/15/11 1600 10/15/11 1700 10/15/11 2354 10/16/11 0637  BP: 146/78  94/56 133/74  Pulse:  89 58 79  Temp:   98 F (36.7 C) 98.3 F (36.8 C)  TempSrc:   Oral Oral  Resp:   16 20  Height:      Weight:      SpO2:   90% 90%   Weight change:   Intake/Output Summary (Last 24 hours) at 10/16/11 1123 Last data filed at 10/15/11 1754  Gross per 24 hour  Intake    600 ml  Output    200 ml  Net    400 ml    Physical Exam: General: Awake, Oriented, No acute distress. HEENT: EOMI. Neck: Supple CV: S1 and S2 Lungs: Clear to auscultation bilaterally Abdomen: Soft, Nontender, Nondistended, +bowel sounds. Ext: Good pulses. Trace edema.  Lab Results:  [facility] 10/15/11 0555 10/14/11 0214  NA 137 134*  K 4.4 3.2*  CL 99 97  CO2 29 26  GLUCOSE 138* 154*  BUN 19 13  CREATININE 1.09 1.11*  CALCIUM 8.6 8.2*  MG 2.2 --  PHOS -- --   No results found for this basename: AST:2,ALT:2,ALKPHOS:2,BILITOT:2,PROT:2,ALBUMIN:2 in the last 72 hours No results found for this basename: LIPASE:2,AMYLASE:2 in the last 72 hours  Basename 10/15/11 0555 10/14/11 0214 10/13/11 2130  WBC 6.9 9.4 --  NEUTROABS -- 6.9 7.7  HGB 11.0* 10.9* --  HCT 33.6* 32.9* --  MCV 94.4 93.2 --  PLT 152 141* --   No results found for this basename: CKTOTAL:3,CKMB:3,CKMBINDEX:3,TROPONINI:3 in the last 72 hours No components found with this basename: POCBNP:3 No results found for this basename: DDIMER:2 in the  last 72 hours No results found for this basename: HGBA1C:2 in the last 72 hours No results found for this basename: CHOL:2,HDL:2,LDLCALC:2,TRIG:2,CHOLHDL:2,LDLDIRECT:2 in the last 72 hours No results found for this basename: TSH,T4TOTAL,FREET3,T3FREE,THYROIDAB in the last 72 hours  Basename 10/15/11 0555  VITAMINB12 410  FOLATE 15.6  FERRITIN 284  TIBC 233*  IRON 21*  RETICCTPCT 1.4    Micro Results: Recent Results (from the past 240 hour(s))  CULTURE, BLOOD (ROUTINE X 2)     Status: Normal (Preliminary result)   Collection Time   10/13/11  9:30 PM      Component Value Range Status Comment   Specimen Description Blood   Final    Special Requests NONE   Final    Culture  Setup Time [id number]   Final    Culture     Final    Value:        BLOOD CULTURE RECEIVED NO GROWTH TO DATE CULTURE WILL BE HELD FOR 5 DAYS BEFORE ISSUING A FINAL NEGATIVE REPORT   Report Status PENDING   Incomplete   CULTURE, BLOOD (ROUTINE X 2)     Status: Normal (Preliminary result)   Collection Time   10/14/11  2:14 AM      Component Value Range Status Comment   Specimen Description Blood   Final    Special  Requests NONE   Final    Culture  Setup Time [id number]   Final    Culture     Final    Value:        BLOOD CULTURE RECEIVED NO GROWTH TO DATE CULTURE WILL BE HELD FOR 5 DAYS BEFORE ISSUING A FINAL NEGATIVE REPORT   Report Status PENDING   Incomplete   CULTURE, EXPECTORATED SPUTUM-ASSESSMENT     Status: Normal   Collection Time   10/14/11 10:03 AM      Component Value Range Status Comment   Specimen Description SPUTUM   Final    Special Requests NONE   Final    Sputum evaluation     Final    Value: THIS SPECIMEN IS ACCEPTABLE. RESPIRATORY CULTURE REPORT TO FOLLOW.   Report Status 10/14/2011 FINAL   Final   CULTURE, RESPIRATORY     Status: Normal (Preliminary result)   Collection Time   10/14/11 10:03 AM      Component Value Range Status Comment   Specimen Description SPUTUM   Final    Special  Requests NONE   Final    Gram Stain     Final    Value: FEW WBC PRESENT, PREDOMINANTLY PMN     RARE SQUAMOUS EPITHELIAL CELLS PRESENT     RARE GRAM POSITIVE COCCI IN PAIRS   Culture Culture reincubated for better growth   Final    Report Status PENDING   Incomplete     Studies/Results: No results found.  Medications: I have reviewed the patient's current medications. Scheduled Meds:    . aspirin  325 mg Oral Daily  . azithromycin  500 mg Oral QHS  . cefUROXime  500 mg Oral BID WC  . digoxin  125 mcg Oral Daily  . diltiazem  90 mg Oral TID  . furosemide  20 mg Oral Daily  . guaiFENesin  600 mg Oral BID  . heparin  5,000 Units Subcutaneous Q8H  . metoprolol tartrate  50 mg Oral QPM  . potassium chloride  10 mEq Oral Daily  . DISCONTD: cefTRIAXone (ROCEPHIN)  IV  1 g Intravenous QHS   Continuous Infusions:  PRN Meds:.haloperidol lactate, HYDROcodone-acetaminophen, ondansetron (ZOFRAN) IV, traMADol, DISCONTD: haloperidol lactate  Assessment/Plan: Acute respiratory failure with hypoxia secondary to community-acquired pneumonia Transition ceftriaxone to cefuroxime.  Continue azithromycin.  Antibiotics since 10/13/2011.  Continue antibiotics for total of 8 days. Continue to wean the patient off oxygen as tolerated, patient still hypoxic requiring oxygen at this time.  Patient needs to reminded to wear oxygen. Blood culture shows no growth to date pending.  Urine Legionella, HIV and strep pneumoniae not reactive.  Likely will need repeat chest x-ray in 4-6 weeks to monitor resolution.  Speech therapy has seen the patient, patient understands the risk of aspiration, she does not want to compromise her appetite given her poor nutrition status.  Chronic atrial fibrillation Rate controlled.  Per discussion with her cardiologist Dr. Antoine Poche as outpatient, patient did not want anticoagulation.  Wanted to continue only aspirin.  Continue Cardizem 90 mg 3 times daily. Continue digoxin 0.125 mg  by mouth daily. Continue metoprolol 50 mg qAM.  Acute delirium Likely due to sundowning.  Mentation normal this morning.  Also due to pneumonia as well.  Cluster headaches Patient's left-sided face pain is likely due to cluster headaches.  Not currently on any prophylactic medications. Continue Norco as needed for pain.   Chronic kidney disease stage II Stable, improved with gentle hydration.  Restarted  furosemide on 10/15/2011.  Saline lock IV.  Will keep euvolemic for now.   Hypokalemia  Repleted.  Hyponatremia  Likely due to Lasix.  Continue monitor.  Improved.    Anemia Etiology unclear, normocytic.  Anemia panel pending.  Impaired glucose tolerance  Hemoglobin A1c on 07/30/2007 was 6.3.  Given patient's age would manage conservatively.  Further management as outpatient.    CODE STATUS DO NOT RESUSCITATE/DO NOT INTUBATE.  Disposition Discharge patient today.   LOS: 3 days  Verda Mehta A, MD 10/16/2011, 11:23 AM

## 2011-10-16 NOTE — Progress Notes (Signed)
UR completed 

## 2011-10-16 NOTE — Evaluation (Signed)
Clinical/Bedside Swallow Evaluation Patient Details  Name: Sylvia Duncan MRN: [id number] Date of Birth: Jul 21, 1912  Today's Date: 10/16/2011 Time: 1050-1120 SLP Time Calculation (min): 30 min  Past Medical History:  Past Medical History  Diagnosis Date  . Hypertension   . Osteopenia   . Atrial fibrillation     not coumadin candidate  . Impaired gait   . Gait abnormality     imbalance  . Urinary incontinence   . Diarrhea     chronic  . Barrett's esophagus   . Osteoarthritis   . Edema leg     chronic, LEFT leg, u/s 2009 neg for DVT   Past Surgical History:  Past Surgical History  Procedure Date  . Cholecystectomy     had pancreatitis  . Hysterectomy-unknown    HPI:  78 y/o CF staretd on Wednesday 5/15 with a fever and then gradually got worse this was assosciated with a HA at the time has a h/o cluster HEadaches.  Developed a cough and congestion. CXR shows possible pna, vs aspiration. Pt has a history of dysphagia with barium swallow in 06 showing small Zenkers diverticulum, prominent CP and mild reflux, pt then underwent dilatation. On 11/07/07 MBS recommended Dysphagia 3 nectar due to intermittent delay with silents aspiration of thin liquids. Multiple swallows recommended. Pt has been consuming thin liquids at home.    Assessment / Plan / Recommendation Clinical Impression  Pt with a moderate risk of aspiration based on history of dysphagia dating back to 2009 with documented Zenker's diverticulum. Pt has been consuming a regular diet and thin liquids at home and is encouraged to increase PO intake by MD. Pt did demonstrate overt s/s of aspiration at bedside, however, MD and SLP discussed history of dysphagia with patient and daughter who verbalized understanding and accepted risk. Pt would like to continue consuming a regular diet and thin liquids. SLP provided strategies to reduce risk and will defer further treatment to SLP at SNF.     Aspiration Risk  Moderate      Diet Recommendation Regular;Thin liquid   Other  Recommendations     Follow Up Recommendations  Skilled Nursing facility    Frequency and Duration        Pertinent Vitals/Pain NA    SLP Swallow Goals     Swallow Study Prior Functional Status       General HPI: 78 y/o CF staretd on Wednesday 5/15 with a fever and then gradually got worse this was assosciated with a HA at the time has a h/o cluster HEadaches.  Developed a cough and congestion. CXR shows possible pna, vs aspiration. Pt has a history of dysphagia with barium swallow in 06 showing small Zenkers diverticulum, prominent CP and mild reflux, pt then underwent dilatation. On 11/07/07 MBS recommended Dysphagia 3 nectar due to intermittent delay with silents aspiration of thin liquids. Multiple swallows recommended. Pt has been consuming thin liquids at home.  Type of Study: Bedside swallow evaluation Previous Swallow Assessment: see HPI Diet Prior to this Study: Regular;Thin liquids Temperature Spikes Noted: No Respiratory Status: Room air Behavior/Cognition: Alert;Cooperative;Pleasant mood Oral Cavity - Dentition: Adequate natural dentition Vision: Functional for self-feeding Patient Positioning: Upright in chair Baseline Vocal Quality: Clear Volitional Cough: Strong Volitional Swallow: Able to elicit    Oral/Motor/Sensory Function Overall Oral Motor/Sensory Function: Appears within functional limits for tasks assessed   Ice Chips     Thin Liquid Thin Liquid: Impaired Presentation: Cup;Self Fed Pharyngeal  Phase Impairments: Delayed Swallow;Wet Vocal  Quality;Throat Clearing - Immediate;Cough - Immediate    Nectar Thick Nectar Thick Liquid: Not tested   Honey Thick Honey Thick Liquid: Not tested   Puree Puree: Not tested   Solid Solid: Not tested    Sylvia Duncan, Sylvia Duncan 10/16/2011,11:56 AM

## 2011-10-16 NOTE — Progress Notes (Signed)
Spoke with Pt and her daughter, Mrs. Kennon Rounds, in Pt's room.  Family chooses [facility].  Per MD, Pt ready for d/c.  Notified facility and sent d/c summary via [facility].  Facility happy to receive Pt.  Facility requesting that Pt and family arrive at 3:15 to sign papers.  Family to transport Pt to facility.  Pt to be d/c'd.  Providence Crosby, LCSWA Clinical Social Work [phone]

## 2011-10-16 NOTE — Discharge Summary (Signed)
Discharge Summary  Sylvia Duncan MR#: [id number]  DOB:04-Nov-1912  Date of Admission: 10/13/2011 Date of Discharge: 10/16/2011  Patient's PCP: Willow Ora, MD, MD  Attending Physician:Ragna Kramlich A  Consults: None  Discharge Diagnoses: Principal Problem:  *Acute respiratory failure with hypoxia Active Problems:  MIGRAINE, COMMON  HYPERTENSION  DYSPHAGIA UNSPECIFIED  Community acquired pneumonia  Hypokalemia  Hyponatremia  CKD (chronic kidney disease), stage III  Chronic a-fib   Brief Admitting History and Physical 78 y/o Caucasian female on Wednesday 5/15 with a fever and then gradually got worse this was assosciated with a HA presented on 10/13/2011 with cough and congestion.  Discharge Medications Medication List  As of 10/16/2011 11:44 AM   TAKE these medications         aspirin 325 MG tablet   Take 325 mg by mouth daily.      azithromycin 500 MG tablet   Commonly known as: ZITHROMAX   Take 1 tablet (500 mg total) by mouth at bedtime. Until 10/21/2011.      cefUROXime 500 MG tablet   Commonly known as: CEFTIN   Take 1 tablet (500 mg total) by mouth 2 (two) times daily with a meal. Until Oct 21, 2011.      digoxin 0.125 MG tablet   Commonly known as: LANOXIN   Take 1 tablet (125 mcg total) by mouth daily.      diltiazem 90 MG tablet   Commonly known as: CARDIZEM   Take 1 tablet (90 mg total) by mouth 3 (three) times daily.      Fiber 625 MG Tabs   Take by mouth daily.      furosemide 20 MG tablet   Commonly known as: LASIX   Take 1 tablet (20 mg total) by mouth daily.      guaiFENesin 600 MG 12 hr tablet   Commonly known as: MUCINEX   Take 1 tablet (600 mg total) by mouth 2 (two) times daily.      HYDROcodone-acetaminophen 5-325 MG per tablet   Commonly known as: NORCO   Take 1 tablet by mouth every 4 (four) hours as needed for pain.      iron polysaccharides 150 MG capsule   Commonly known as: NIFEREX   Take 1 capsule (150 mg total) by mouth  daily.      metoprolol tartrate 25 MG tablet   Commonly known as: LOPRESSOR   Take 2 tablets (50 mg total) by mouth every evening.      potassium chloride 10 MEQ tablet   Commonly known as: K-DUR   Take 1 tablet (10 mEq total) by mouth daily.      traMADol 50 MG tablet   Commonly known as: ULTRAM   Take 1 tablet (50 mg total) by mouth every 6 (six) hours as needed for pain.            Hospital Course: Acute respiratory failure with hypoxia secondary to community-acquired pneumonia Likely due to aspiration pneumonia and bacterial pneumonia initially from viral upper respiratory infection.  Initially patient was started on ceftriaxone which was transitioned to oral cefuroxime at discharge.  Continue azithromycin.  Antibiotics since 10/13/2011.  Continue antibiotics till 10/21/2011 for total of 8 days.  Patient during the course of hospital stay is hypoxic with saturation but improved on oxygen, continue to wean the patient off oxygen as tolerated as outpatient.  Patient needs to reminded to wear oxygen at times. Blood culture shows no growth to date pending.  Urine Legionella, HIV and  strep pneumoniae not reactive.  Likely will need repeat chest x-ray in 4-6 weeks to monitor resolution.  Speech therapy has seen the patient, patient understands the risk of aspiration, she does not want to compromise her appetite given her poor nutrition status.  Chronic atrial fibrillation Rate controlled.  Per discussion with her cardiologist Dr. Antoine Poche as outpatient, patient did not want anticoagulation.  Wanted to continue only aspirin.  Continue Cardizem 90 mg 3 times daily. Continue digoxin 0.125 mg by mouth daily. Continue metoprolol 50 mg qAM.  Acute delirium Patient during the night of 10/15/2011 was slightly confused, mentation was normal in the morning, likely due to sundowning and pneumonia.  Patient was oriented x3 and had the faculties prior to discharge.  Cluster headaches Patient's  left-sided face pain is likely due to cluster headaches.  Not currently on any prophylactic medications. Continue Norco as needed for pain.   Chronic kidney disease stage II Stable, improved with gentle hydration.  Restarted furosemide on 10/15/2011.  Keep euvolemic for now.   Hypokalemia  Repleted.  Hyponatremia  Likely due to Lasix.  Resolved.    Anemia Likely due to iron deficiency.  Started supplemental iron prior to discharge.  Impaired glucose tolerance  Hemoglobin A1c on 07/30/2007 was 6.3.  Given patient's age would manage conservatively.  Further management as outpatient.    DO NOT RESUSCITATE/DO NOT INTUBATE.  Day of Discharge BP 133/74  Pulse 79  Temp(Src) 98.3 F (36.8 C) (Oral)  Resp 20  Ht 5\' 4"  (1.626 m)  Wt 59.7 kg (131 lb 9.8 oz)  BMI 22.59 kg/m2  SpO2 90%  Results for orders placed during the hospital encounter of 10/13/11 (from the past 48 hour(s))  CBC     Status: Abnormal   Collection Time   10/15/11  5:55 AM      Component Value Range Comment   WBC 6.9  4.0 - 10.5 (K/uL)    RBC 3.56 (*) 3.87 - 5.11 (MIL/uL)    Hemoglobin 11.0 (*) 12.0 - 15.0 (g/dL)    HCT 96.0 (*) 45.4 - 46.0 (%)    MCV 94.4  78.0 - 100.0 (fL)    MCH 30.9  26.0 - 34.0 (pg)    MCHC 32.7  30.0 - 36.0 (g/dL)    RDW 09.8  11.9 - 14.7 (%)    Platelets 152  150 - 400 (K/uL)   BASIC METABOLIC PANEL     Status: Abnormal   Collection Time   10/15/11  5:55 AM      Component Value Range Comment   Sodium 137  135 - 145 (mEq/L)    Potassium 4.4  3.5 - 5.1 (mEq/L)    Chloride 99  96 - 112 (mEq/L)    CO2 29  19 - 32 (mEq/L)    Glucose, Bld 138 (*) 70 - 99 (mg/dL)    BUN 19  6 - 23 (mg/dL)    Creatinine, Ser 8.29  0.50 - 1.10 (mg/dL)    Calcium 8.6  8.4 - 10.5 (mg/dL)    GFR calc non Af Amer 41 (*) >90 (mL/min)    GFR calc Af Amer 48 (*) >90 (mL/min)   MAGNESIUM     Status: Normal   Collection Time   10/15/11  5:55 AM      Component Value Range Comment   Magnesium 2.2  1.5 - 2.5 (mg/dL)    VITAMIN F62     Status: Normal   Collection Time   10/15/11  5:55 AM  Component Value Range Comment   Vitamin B-12 410  211 - 911 (pg/mL)   FOLATE     Status: Normal   Collection Time   10/15/11  5:55 AM      Component Value Range Comment   Folate 15.6     IRON AND TIBC     Status: Abnormal   Collection Time   10/15/11  5:55 AM      Component Value Range Comment   Iron 21 (*) 42 - 135 (ug/dL)    TIBC 132 (*) 440 - 470 (ug/dL)    Saturation Ratios 9 (*) 20 - 55 (%)    UIBC 212  125 - 400 (ug/dL)   FERRITIN     Status: Normal   Collection Time   10/15/11  5:55 AM      Component Value Range Comment   Ferritin 284  10 - 291 (ng/mL)   RETICULOCYTES     Status: Abnormal   Collection Time   10/15/11  5:55 AM      Component Value Range Comment   Retic Ct Pct 1.4  0.4 - 3.1 (%)    RBC. 3.56 (*) 3.87 - 5.11 (MIL/uL)    Retic Count, Manual 49.8  19.0 - 186.0 (K/uL)     Dg Chest 2 View  10/13/2011  *RADIOLOGY REPORT*  Clinical Data: Cough, congestion, chest pain, hypertension.  CHEST - 2 VIEW  Comparison: 08/01/2007  Findings: Cardiomegaly.  Aortic arch atherosclerosis.  Central vascular congestion.  Mild retrocardiac opacity.  Mild interstitial prominence.  Diffuse osteopenia.  IMPRESSION: Cardiomegaly with central vascular congestion. Mild interstitial edema not excluded.  Retrocardiac opacity; atelectasis, aspiration, or pneumonia.  Original Report Authenticated By: Waneta Martins, M.D.   Disposition: Skilled nursing facility for PT/OT  Diet: Heart healthy diet with liquids.  Activity: Resume as tolerated   Follow-up Appts: Discharge Orders    Future Appointments: Provider: Department: Dept Phone: Center:   10/27/2011 10:15 AM [facility] [phone]   11/06/2011 9:15 AM Rollene Rotunda, MD [facility] [phone] [id number]     Future Orders Please Complete By Expires   Diet - low sodium heart healthy      Increase activity slowly      Discharge  instructions      Comments:   Followup with Willow Ora, MD (PCP) in 1 week.      TESTS THAT NEED FOLLOW-UP None  Time spent on discharge, talking to the patient, and coordinating care: 35 mins.  Signed: Cristal Ford, MD 10/16/2011, 11:38 AM

## 2011-10-20 LAB — CULTURE, BLOOD (ROUTINE X 2)
Culture  Setup Time: 201305181142
Culture: NO GROWTH

## 2011-10-23 ENCOUNTER — Encounter: Payer: Self-pay | Admitting: Internal Medicine

## 2011-10-23 NOTE — Telephone Encounter (Signed)
This encounter was created in error - please disregard.

## 2011-10-25 ENCOUNTER — Ambulatory Visit (INDEPENDENT_AMBULATORY_CARE_PROVIDER_SITE_OTHER): Payer: Medicare Other | Admitting: Internal Medicine

## 2011-10-25 VITALS — BP 108/62 | HR 69 | Temp 98.0°F

## 2011-10-25 DIAGNOSIS — I4891 Unspecified atrial fibrillation: Secondary | ICD-10-CM

## 2011-10-25 DIAGNOSIS — I10 Essential (primary) hypertension: Secondary | ICD-10-CM

## 2011-10-25 DIAGNOSIS — M1990 Unspecified osteoarthritis, unspecified site: Secondary | ICD-10-CM

## 2011-10-25 DIAGNOSIS — R7309 Other abnormal glucose: Secondary | ICD-10-CM

## 2011-10-25 DIAGNOSIS — D509 Iron deficiency anemia, unspecified: Secondary | ICD-10-CM | POA: Insufficient documentation

## 2011-10-25 DIAGNOSIS — R739 Hyperglycemia, unspecified: Secondary | ICD-10-CM

## 2011-10-25 DIAGNOSIS — J189 Pneumonia, unspecified organism: Secondary | ICD-10-CM

## 2011-10-25 MED ORDER — POLYSACCHARIDE IRON COMPLEX 150 MG PO CAPS: 150.0000 mg | ORAL_CAPSULE | Freq: Every day | ORAL | Status: DC

## 2011-10-25 NOTE — Assessment & Plan Note (Signed)
Symptoms well-controlled, no change 

## 2011-10-25 NOTE — Progress Notes (Signed)
  Subjective:    Patient ID: Sylvia Duncan, female    DOB: [birth year], 78 y.o.   MRN: [id number]  HPI Hospital followup Was admitted 10/13/2011 for 3 days, then she was discharged to a rehabilitation facility and eventually discharged home 2 days ago. She was admitted with headache, cough, chest congestion. Chest x-ray show a retrocardiac opacity, at some point she was hypoxic. Was diagnosed with pneumonia and treated with IV and by mouth antibiotics. Chart is reviewed, she was also found to have anemia with iron deficiency. Blood sugars were slightly elevated.  Past Medical History  Diagnosis Date  . Hypertension   . Osteopenia   . Atrial fibrillation     not coumadin candidate  . Impaired gait   . Gait abnormality     imbalance  . Urinary incontinence   . Diarrhea     chronic  . Barrett's esophagus   . Osteoarthritis   . Edema leg     chronic, LEFT leg, u/s 2009 neg for DVT   Past Surgical History  Procedure Date  . Cholecystectomy     had pancreatitis  . Hysterectomy-unknown      Social History:  widow , lives by self , has someone with her 24 h 2 daughter, in [place]; Corrie Dandy and George older one is POA  [phone]) lost 2 children  Does not drive anymore Tobacco-- never  ETOH-- no   Review of Systems Denies any fever or chills Shortness of breath is at baseline, she does have some dyspnea on exertion which is more than it used to be. Appetite is poor, the daughter wonders if it is related to the fact she was at a rehabilitation facility and was not happy about it. The patient denies depression per se, and the daughter agrees. Continue with cough which is moderate No nausea, vomiting, diarrhea or blood in the stools .    Objective:   Physical Exam General -- alert, well-developed, and well-nourished. NAD   Lungs -- normal respiratory effort, no intercostal retractions, no accessory muscle use, and normal breath sounds.   Heart-- seems regular  today Abdomen--soft, non-tender, no distention Extremities-- no pretibial edema bilaterally Psych-- Cognition and judgment appear intact. Alert and cooperative with normal attention span and concentration.  not anxious appearing and not depressed appearing.       Assessment & Plan:

## 2011-10-25 NOTE — Assessment & Plan Note (Addendum)
On hydrocodone and Ultram when necessary Advised pt's daughter to tell us which one is the one she likes

## 2011-10-25 NOTE — Patient Instructions (Signed)
Please get your x-ray at the other [place]  office located at: [address] [place], across from [facility].  Please go to the basement, this is a walk-in facility, they are open from 8:30 to 5:30 PM. Phone number [phone]. ----- Continue taking iron and continue taking Robitussin-DM or Mucinex DM as for cough Call if he has increase weakness, fever or worsening cough

## 2011-10-25 NOTE — Assessment & Plan Note (Signed)
On aspirin, digoxin, beta blockers and calcium channel blockers

## 2011-10-25 NOTE — Assessment & Plan Note (Addendum)
Status post admission to the hospital with pneumonia and respiratory failure. She seems to be doing very well despite the fact that she reports she is very weak.  After the hospital stay she went to a rehabilitation place, the patient's daughter thinks  that Sylvia Duncan may not be feeling well because she didn't like that place particularly.  Plan: Followup chest x-ray Labs-- CBC, A1c due to the recent drop in hemoglobin and hyperglycemia. 24-hour pulse oximetry, she was hypoxic in the hospital and now has dyspnea on exertion, may benefit from O2----> addendum, patient stated she's not ready to use oxygen consequently will reassess the situation in 2 months

## 2011-10-25 NOTE — Assessment & Plan Note (Addendum)
At her recent admission, the hemoglobin was noted to be low, iron and ferritin low as well. On chart review she had a colonoscopy in 2012. She's currently on iron, I don't think is appropriate to do further evaluation. Plan: Monitor CBC. Continue with iron supplements

## 2011-10-26 ENCOUNTER — Encounter: Payer: Self-pay | Admitting: Internal Medicine

## 2011-10-26 ENCOUNTER — Ambulatory Visit (INDEPENDENT_AMBULATORY_CARE_PROVIDER_SITE_OTHER)
Admission: RE | Admit: 2011-10-26 | Discharge: 2011-10-26 | Disposition: A | Discharge status: Home / Self Care | Payer: Medicare Other | Source: Ambulatory Visit | Attending: Internal Medicine | Admitting: Internal Medicine

## 2011-10-26 DIAGNOSIS — J189 Pneumonia, unspecified organism: Secondary | ICD-10-CM

## 2011-10-26 LAB — CBC WITH DIFFERENTIAL/PLATELET
Basophils Absolute: 0 10*3/uL (ref 0.0–0.1)
HCT: 38.5 % (ref 36.0–46.0)
Hemoglobin: 12.7 g/dL (ref 12.0–15.0)
Lymphs Abs: 2 10*3/uL (ref 0.7–4.0)
MCHC: 32.9 g/dL (ref 30.0–36.0)
MCV: 96.4 fl (ref 78.0–100.0)
Monocytes Absolute: 1.9 10*3/uL — ABNORMAL HIGH (ref 0.1–1.0)
Monocytes Relative: 18.4 % — ABNORMAL HIGH (ref 3.0–12.0)
Neutro Abs: 6.3 10*3/uL (ref 1.4–7.7)
RDW: 15.7 % — ABNORMAL HIGH (ref 11.5–14.6)

## 2011-10-27 ENCOUNTER — Ambulatory Visit (HOSPITAL_COMMUNITY): Payer: Medicare Other

## 2011-10-30 ENCOUNTER — Encounter: Payer: Self-pay | Admitting: *Deleted

## 2011-11-06 ENCOUNTER — Encounter: Payer: Self-pay | Admitting: Cardiology

## 2011-11-06 ENCOUNTER — Ambulatory Visit (INDEPENDENT_AMBULATORY_CARE_PROVIDER_SITE_OTHER): Payer: Medicare Other | Admitting: Cardiology

## 2011-11-06 VITALS — BP 102/62 | HR 63 | Ht 60.0 in | Wt 127.8 lb

## 2011-11-06 DIAGNOSIS — I4891 Unspecified atrial fibrillation: Secondary | ICD-10-CM

## 2011-11-06 DIAGNOSIS — I10 Essential (primary) hypertension: Secondary | ICD-10-CM

## 2011-11-06 NOTE — Assessment & Plan Note (Signed)
The blood pressure is at target. No change in medications is indicated. We will continue with therapeutic lifestyle changes (TLC).  

## 2011-11-06 NOTE — Patient Instructions (Signed)
Your physician wants you to follow-up in: 1 year with Dr. Hochrein.  You will receive a reminder letter in the mail two months in advance. If you don't receive a letter, please call our office to schedule the follow-up appointment.  

## 2011-11-06 NOTE — Progress Notes (Signed)
   HPI Patient presents for followup of atrial fibrillation.   Since I last saw her she has had no new complaints.  She does get palpitations at night. These do not particularly bother her. She has no new chest pain.  She does not report presyncope or syncope. There is apparently some orthostasis. She's not having any of the rectal bleeding or rash she had last year.  No Known Allergies  Current Outpatient Prescriptions  Medication Sig Dispense Refill  . aspirin 325 MG tablet Take 325 mg by mouth daily.        . Calcium Polycarbophil (FIBER) 625 MG TABS Take by mouth daily.        . digoxin (LANOXIN) 0.125 MG tablet Take 1 tablet (125 mcg total) by mouth daily.  90 tablet  1  . diltiazem (CARDIZEM) 90 MG tablet Take 1 tablet (90 mg total) by mouth 3 (three) times daily.  90 tablet  1  . furosemide (LASIX) 20 MG tablet Take 1 tablet (20 mg total) by mouth daily.  90 tablet  1  . guaiFENesin (MUCINEX) 600 MG 12 hr tablet Take 1 tablet (600 mg total) by mouth 2 (two) times daily.      Marland Kitchen HYDROcodone-acetaminophen (NORCO) 5-325 MG per tablet Take 1 tablet by mouth every 4 (four) hours as needed for pain.  20 tablet  0  . iron polysaccharides (NIFEREX) 150 MG capsule Take 1 capsule (150 mg total) by mouth daily.  90 capsule  1  . metoprolol tartrate (LOPRESSOR) 25 MG tablet Take 2 tablets (50 mg total) by mouth every evening.  180 tablet  1  . potassium chloride (K-DUR) 10 MEQ tablet Take 1 tablet (10 mEq total) by mouth daily.  90 tablet  1    Past Medical History  Diagnosis Date  . Hypertension   . Osteopenia   . Atrial fibrillation     not coumadin candidate  . Impaired gait   . Gait abnormality     imbalance  . Urinary incontinence   . Diarrhea     chronic  . Barrett's esophagus   . Osteoarthritis   . Edema leg     chronic, LEFT leg, u/s 2009 neg for DVT    Past Surgical History  Procedure Date  . Cholecystectomy     had pancreatitis  . Hysterectomy-unknown     ROS:  As  stated in the HPI and negative for all other systems.  PHYSICAL EXAM BP 102/62  Pulse 63  Ht 5' (1.524 m)  Wt 127 lb 12.8 oz (57.97 kg)  BMI 24.96 kg/m2 GENERAL:  Well appearing for her advanced age, frail NECK:  No jugular venous distention, waveform within normal limits, carotid upstroke brisk and symmetric, no bruits, no thyromegaly LUNGS:  Clear to auscultation bilaterally BACK:  No CVA tenderness CHEST:  Unremarkable HEART:  PMI not displaced or sustained,S1 and S2 within normal limits, no S3,  no clicks, no rubs, early peaking murmur systolic, irregular ABD:  Flat, positive bowel sounds normal in frequency in pitch, no bruits, no rebound, no guarding, no midline pulsatile mass, no hepatomegaly, no splenomegaly EXT:  2 plus pulses throughout, no edema, no cyanosis no clubbing, muscle wasting appropriate for age.  EKG:  Atrial fibrillation, leftward axis, right bundle branch block, no acute ST-T wave changes.  11/06/2011  ASSESSMENT AND PLAN

## 2011-11-06 NOTE — Assessment & Plan Note (Addendum)
Ms. Sylvia Duncan has a CHA2DS2 - VASc score of 4 with a risk of stroke of 4%  and a HAS - BLED score of 4.with one validation study suggesting a risk of 8.7 bleeds per 100 patient years.  We discussed the risk of stroke. She would absolutely not want blood thinners. She will continue with aspirin. No further change in therapy is indicated.

## 2011-11-17 ENCOUNTER — Emergency Department (HOSPITAL_COMMUNITY)
Admission: EM | Admit: 2011-11-17 | Discharge: 2011-11-17 | Disposition: A | Discharge status: Home / Self Care | Payer: Medicare Other | Attending: Emergency Medicine | Admitting: Emergency Medicine

## 2011-11-17 ENCOUNTER — Encounter (HOSPITAL_COMMUNITY): Payer: Self-pay | Admitting: Emergency Medicine

## 2011-11-17 DIAGNOSIS — G252 Other specified forms of tremor: Secondary | ICD-10-CM | POA: Insufficient documentation

## 2011-11-17 DIAGNOSIS — M1990 Unspecified osteoarthritis, unspecified site: Secondary | ICD-10-CM | POA: Insufficient documentation

## 2011-11-17 DIAGNOSIS — R269 Unspecified abnormalities of gait and mobility: Secondary | ICD-10-CM | POA: Insufficient documentation

## 2011-11-17 DIAGNOSIS — R32 Unspecified urinary incontinence: Secondary | ICD-10-CM | POA: Insufficient documentation

## 2011-11-17 DIAGNOSIS — Z7982 Long term (current) use of aspirin: Secondary | ICD-10-CM | POA: Insufficient documentation

## 2011-11-17 DIAGNOSIS — R251 Tremor, unspecified: Secondary | ICD-10-CM

## 2011-11-17 DIAGNOSIS — M949 Disorder of cartilage, unspecified: Secondary | ICD-10-CM | POA: Insufficient documentation

## 2011-11-17 DIAGNOSIS — M899 Disorder of bone, unspecified: Secondary | ICD-10-CM | POA: Insufficient documentation

## 2011-11-17 DIAGNOSIS — I4891 Unspecified atrial fibrillation: Secondary | ICD-10-CM | POA: Insufficient documentation

## 2011-11-17 DIAGNOSIS — I10 Essential (primary) hypertension: Secondary | ICD-10-CM | POA: Insufficient documentation

## 2011-11-17 DIAGNOSIS — G250 Essential tremor: Secondary | ICD-10-CM | POA: Insufficient documentation

## 2011-11-17 LAB — CBC
HCT: 38.8 % (ref 36.0–46.0)
MCHC: 33.2 g/dL (ref 30.0–36.0)
MCV: 94.9 fL (ref 78.0–100.0)
RDW: 15.4 % (ref 11.5–15.5)

## 2011-11-17 LAB — BASIC METABOLIC PANEL
BUN: 17 mg/dL (ref 6–23)
Calcium: 9.5 mg/dL (ref 8.4–10.5)
Chloride: 101 mEq/L (ref 96–112)
Creatinine, Ser: 1.1 mg/dL (ref 0.50–1.10)
GFR calc Af Amer: 48 mL/min — ABNORMAL LOW (ref >90)

## 2011-11-17 LAB — URINALYSIS, ROUTINE W REFLEX MICROSCOPIC
Bilirubin Urine: NEGATIVE
Hgb urine dipstick: NEGATIVE
Protein, ur: NEGATIVE mg/dL
Urobilinogen, UA: 1 mg/dL (ref 0.0–1.0)

## 2011-11-17 LAB — DIFFERENTIAL
Basophils Absolute: 0 10*3/uL (ref 0.0–0.1)
Basophils Relative: 0 % (ref 0–1)
Eosinophils Relative: 3 % (ref 0–5)
Monocytes Absolute: 0.7 10*3/uL (ref 0.1–1.0)
Neutro Abs: 5 10*3/uL (ref 1.7–7.7)

## 2011-11-17 NOTE — ED Notes (Signed)
Vital signs stable. 

## 2011-11-17 NOTE — ED Provider Notes (Signed)
History     CSN: [id number]  Arrival date & time 11/17/11  0543   First MD Initiated Contact with Patient 11/17/11 [phone]      Chief Complaint  Patient presents with  . Tremors    (Consider location/radiation/quality/duration/timing/severity/associated sxs/prior treatment) HPI History from patient and daughter. 78 year old female who presents with complaint of "tremors." She states this started last evening. She complains of her right arm intermittently shaking. She denies any numbness, weakness, tingling in the arm. Denies any chest pain, shortness of breath or cough. States she has had some intermittent pain in the left shoulder, but none in the right. Has never had anything like this before. No treatment at home prior to coming here. No known aggravating/alleviating factors. Triage note stated pt c/o increased weakness - she denies this at this time.  Past Medical History  Diagnosis Date  . Hypertension   . Osteopenia   . Atrial fibrillation     not coumadin candidate  . Impaired gait   . Gait abnormality     imbalance  . Urinary incontinence   . Diarrhea     chronic  . Barrett's esophagus   . Osteoarthritis   . Edema leg     chronic, LEFT leg, u/s 2009 neg for DVT    Past Surgical History  Procedure Date  . Cholecystectomy     had pancreatitis  . Hysterectomy-unknown     No family history on file.  History  Substance Use Topics  . Smoking status: Never Smoker   . Smokeless tobacco: Not on file  . Alcohol Use: No    OB History    Grav Para Term Preterm Abortions TAB SAB Ect Mult Living                  Review of Systems  Constitutional: Negative for fever, chills, activity change and appetite change.  HENT: Negative for neck pain.   Eyes: Negative for photophobia and visual disturbance.  Respiratory: Negative for chest tightness and shortness of breath.   Cardiovascular: Negative for chest pain and palpitations.  Gastrointestinal: Negative for nausea,  vomiting and abdominal pain.  Musculoskeletal: Negative for myalgias.  Skin: Negative for color change and rash.  Neurological: Positive for tremors. Negative for weakness.    Allergies  Review of patient's allergies indicates no known allergies.  Home Medications   Current Outpatient Rx  Name Route Sig Dispense Refill  . ASPIRIN 325 MG PO TABS Oral Take 325 mg by mouth daily.      Marland Kitchen FIBER 625 MG PO TABS Oral Take by mouth daily.      Marland Kitchen DIGOXIN 0.125 MG PO TABS Oral Take 1 tablet (125 mcg total) by mouth daily. 90 tablet 1  . DILTIAZEM HCL 90 MG PO TABS Oral Take 1 tablet (90 mg total) by mouth 3 (three) times daily. 90 tablet 1  . FUROSEMIDE 20 MG PO TABS Oral Take 1 tablet (20 mg total) by mouth daily. 90 tablet 1  . GUAIFENESIN ER 600 MG PO TB12 Oral Take 1 tablet (600 mg total) by mouth 2 (two) times daily.    Marland Kitchen HYDROCODONE-ACETAMINOPHEN 5-325 MG PO TABS Oral Take 1 tablet by mouth every 4 (four) hours as needed for pain. 20 tablet 0  . POLYSACCHARIDE IRON COMPLEX 150 MG PO CAPS Oral Take 1 capsule (150 mg total) by mouth daily. 90 capsule 1  . METOPROLOL TARTRATE 25 MG PO TABS Oral Take 2 tablets (50 mg total) by mouth every  evening. 180 tablet 1  . POTASSIUM CHLORIDE ER 10 MEQ PO TBCR Oral Take 1 tablet (10 mEq total) by mouth daily. 90 tablet 1  . TRAMADOL HCL 50 MG PO TABS Oral Take 50 mg by mouth every 6 (six) hours as needed. For pain      BP 151/65  Pulse 64  Temp 97.7 F (36.5 C) (Oral)  Resp 18  Ht 5\' 3"  (1.6 m)  Wt 125 lb (56.7 kg)  BMI 22.14 kg/m2  SpO2 93%  Physical Exam  Nursing note and vitals reviewed. Constitutional: She appears well-developed and well-nourished. No distress.  HENT:  Head: Normocephalic and atraumatic.  Mouth/Throat: Oropharynx is clear and moist. No oropharyngeal exudate.  Eyes: EOM are normal. Pupils are equal, round, and reactive to light.  Neck: Normal range of motion. Neck supple.  Cardiovascular: Normal rate, regular rhythm and  normal heart sounds.   Pulmonary/Chest: Effort normal and breath sounds normal. She exhibits no tenderness.  Abdominal: Soft. Bowel sounds are normal. There is no tenderness. There is no rebound.  Musculoskeletal: Normal range of motion.       NT to palp over R shoulder and arm, FROM and strength as compared with L As pt was telling me about her tremor the R arm was noted to have generalized tremors, but this was not noted during remainder of the exam.  Neurological: She is alert.       Speech clear, pupils equal round reactive to light, extraocular movements intact   Normal peripheral visual fields Cranial nerves III through XII normal including no facial droop Follows commands, moves all extremities x4, normal strength to bilateral upper and lower extremities at all major muscle groups including grip Sensation normal to light touch Coordination intact, no limb ataxia, finger-nose-finger normal Rapid alternating movements normal No pronator drift  Skin: Skin is warm and dry. No rash noted. She is not diaphoretic.  Psychiatric: She has a normal mood and affect.    ED Course  Procedures (including critical care time)  Labs Reviewed  BASIC METABOLIC PANEL - Abnormal; Notable for the following:    Glucose, Bld 120 (*)     GFR calc non Af Amer 41 (*)     GFR calc Af Amer 48 (*)     All other components within normal limits  CBC  DIFFERENTIAL  DIGOXIN LEVEL  URINALYSIS, ROUTINE W REFLEX MICROSCOPIC   No results found.   1. Tremor       MDM  Patient with c/o tremors since last night. On exam, she appears to have a generalized tremor to her hand and forearm which was not seen during hx taking or when distracted. Norm neuro exam otherwise. CBC, lytes, dig level, UA all nl which is reassuring. Feel pt stable for dc home at this time with close PCP f/u.  D/W Dr. Ranae Palms.       Grant Fontana, PA-C 11/17/11 [phone]

## 2011-11-17 NOTE — Discharge Instructions (Signed)
Your labs today were reassuring appearing and were within normal limits. Please followup with your primary care Dr. Regarding these tremors. Return to the emergency department immediately should you develop weakness on one side of your body, notice facial drooping, difficulty speaking or walking, or with any other concerns about worsening condition.  Tremor Tremor is a rhythmic, involuntary muscular contraction characterized by oscillations (to-and-fro movements) of a part of the body. The most common of all involuntary movements, tremor can affect various body parts such as the hands, head, facial structures, vocal cords, trunk, and legs; most tremors, however, occur in the hands. Tremor often accompanies neurological disorders associated with aging. Although the disorder is not life-threatening, it can be responsible for functional disability and social embarrassment. TREATMENT  There are many types of tremor and several ways in which tremor is classified. The most common classification is by behavioral context or position. There are five categories of tremor within this classification: resting, postural, kinetic, task-specific, and psychogenic. Resting or static tremor occurs when the muscle is at rest, for example when the hands are lying on the lap. This type of tremor is often seen in patients with Parkinson's disease. Postural tremor occurs when a patient attempts to maintain posture, such as holding the hands outstretched. Postural tremors include physiological tremor, essential tremor, tremor with basal ganglia disease (also seen in patients with Parkinson's disease), cerebellar postural tremor, tremor with peripheral neuropathy, post-traumatic tremor, and alcoholic tremor. Kinetic or intention (action) tremor occurs during purposeful movement, for example during finger-to-nose testing. Task-specific tremor appears when performing goal-oriented tasks such as handwriting, speaking, or standing. This  group consists of primary writing tremor, vocal tremor, and orthostatic tremor. Psychogenic tremor occurs in both older and younger patients. The key feature of this tremor is that it dramatically lessens or disappears when the patient is distracted. PROGNOSIS There are some treatment options available for tremor; the appropriate treatment depends on accurate diagnosis of the cause. Some tremors respond to treatment of the underlying condition, for example in some cases of psychogenic tremor treating the patient's underlying mental problem may cause the tremor to disappear. Also, patients with tremor due to Parkinson's disease may be treated with Levodopa drug therapy. Symptomatic drug therapy is available for several other tremors as well. For those cases of tremor in which there is no effective drug treatment, physical measures such as teaching the patient to brace the affected limb during the tremor are sometimes useful. Surgical intervention such as thalamotomy or deep brain stimulation may be useful in certain cases. Document Released: 05/05/2002 Document Revised: 05/04/2011 Document Reviewed: 05/15/2005 [facility] Patient Information 2012 [place], [place].

## 2011-11-17 NOTE — ED Notes (Signed)
Patient transported to CT by [facility]

## 2011-11-17 NOTE — ED Notes (Signed)
Patient transported by [facility].  Patient complaint of tremors x 1 week.  Patient also complaining of increased weakness x 1 week.

## 2011-11-17 NOTE — ED Notes (Signed)
Initial assessment performed at 0700 by this RN following report from [employer]

## 2011-11-17 NOTE — ED Provider Notes (Signed)
Medical screening examination/treatment/procedure(s) were conducted as a shared visit with non-physician practitioner(s) and myself.  I personally evaluated the patient during the encounter   Loren Racer, MD 11/17/11 1610

## 2011-11-17 NOTE — ED Notes (Signed)
Pt's 3 family members given coffee per request.

## 2011-11-21 ENCOUNTER — Telehealth: Payer: Self-pay | Admitting: *Deleted

## 2011-11-22 NOTE — Telephone Encounter (Signed)
Done

## 2011-11-24 ENCOUNTER — Ambulatory Visit (INDEPENDENT_AMBULATORY_CARE_PROVIDER_SITE_OTHER): Payer: Medicare Other | Admitting: Internal Medicine

## 2011-11-24 ENCOUNTER — Encounter: Payer: Self-pay | Admitting: Internal Medicine

## 2011-11-24 VITALS — BP 98/62 | HR 82 | Temp 97.8°F | Resp 16 | Ht 63.5 in | Wt 128.2 lb

## 2011-11-24 DIAGNOSIS — R05 Cough: Secondary | ICD-10-CM

## 2011-11-24 DIAGNOSIS — R251 Tremor, unspecified: Secondary | ICD-10-CM

## 2011-11-24 DIAGNOSIS — R259 Unspecified abnormal involuntary movements: Secondary | ICD-10-CM

## 2011-11-24 DIAGNOSIS — J189 Pneumonia, unspecified organism: Secondary | ICD-10-CM

## 2011-11-24 DIAGNOSIS — S61409A Unspecified open wound of unspecified hand, initial encounter: Secondary | ICD-10-CM

## 2011-11-24 NOTE — Assessment & Plan Note (Signed)
Very mild cough, change Mucinex DM to twice a day when necessary.

## 2011-11-24 NOTE — Patient Instructions (Addendum)
Mucinex DM only as needed for cough. Next office visit in approximately 6 weeks Use over-the-counter antibiotic ointment for the right hand. If is  not healing in the next 4 weeks let me know

## 2011-11-24 NOTE — Progress Notes (Signed)
  Subjective:    Patient ID: Sylvia Duncan, female    DOB: Dec 07, 1912, 78 y.o.   MRN: [id number]  HPI ER f/u, here w/ Tonya her care giver  Micah Flesher to the ER 11/17/2011 with a right arm tremor. At the ER, labs were normal. Tremor was not observed. Symptoms have not come back. Also, few days ago she pulled   a skin lesion from the right hand (seborreic keratosis?), area is not healing yet.  Past Medical History  Diagnosis Date  . Hypertension   . Osteopenia   . Atrial fibrillation     not coumadin candidate  . Impaired gait   . Gait abnormality     imbalance  . Urinary incontinence   . Diarrhea     chronic  . Barrett's esophagus   . Osteoarthritis   . Edema leg     chronic, LEFT leg, u/s 2009 neg for DVT   Past Surgical History  Procedure Date  . Cholecystectomy     had pancreatitis  . Hysterectomy-unknown     Social History:   widow , lives by self , has someone with her 24 h 2 daughter, in [place]; Corrie Dandy and [place] older one is POA  [phone]) lost 2 children   Does not drive anymore Tobacco-- never   ETOH-- no  Review of Systems Since the ER visit, in general she feels well. She still has a very mild cough, taking Mucinex DM twice a day. Denies fever or chills Mild shortness of breath as usual. Denies any headache, slurred speech or facial weaknesses.     Objective:   Physical Exam  Musculoskeletal:       Hands:   General -- alert, well-developed, and well-nourished. NAD  Lungs -- normal respiratory effort, no intercostal retractions, no accessory muscle use, and normal breath sounds.  Neuro-- no tremor noted today Extremities-- trace pretibial edema L, none @ the R  Psych-- Cognition and judgment appear intact. Alert and cooperative with normal attention span and concentration. not anxious appearing and not depressed appearing.      Assessment & Plan:   Superficial wound -- new problem,at the right hand, see instructions Tremor, right arm,  resolved.  Etiology unclear. Recommend observation  Today , I spent more than 25 min with the patient, >50% of the time counseling, and reviewing the chart and labs ordered by other providers

## 2011-12-25 ENCOUNTER — Ambulatory Visit: Payer: Medicare Other | Admitting: Internal Medicine

## 2011-12-26 ENCOUNTER — Other Ambulatory Visit: Payer: Self-pay | Admitting: Internal Medicine

## 2011-12-26 MED ORDER — DILTIAZEM HCL 90 MG PO TABS: 90.0000 mg | ORAL_TABLET | Freq: Three times a day (TID) | ORAL | Status: DC

## 2011-12-26 NOTE — Telephone Encounter (Signed)
Refill done.  

## 2011-12-26 NOTE — Telephone Encounter (Signed)
Refill Diltiazem per fax from [facility] Requesting 90-day supply    Chart shows last [id number]. as Diltiazem HCl (Tab) CARDIZEM 90 MG Take 1 tablet (90 mg total) by mouth 3 (three) times daily.   Last ov 6.28.13 post hospital

## 2012-01-02 ENCOUNTER — Telehealth: Payer: Self-pay | Admitting: Internal Medicine

## 2012-01-02 NOTE — Telephone Encounter (Signed)
Please advise 

## 2012-01-02 NOTE — Telephone Encounter (Signed)
Noted  

## 2012-01-02 NOTE — Telephone Encounter (Signed)
Caller: Bud Face; PCP: Willow Ora; CB#: [phone]; ; ; Call regarding Left Shoulder Pain; onset around 12-19-11 of left shoulder pain, she sleeps on that left side all the time.  she wakes up in pain but shortly after pain is minimal and she has total use of the arm, can raise above head.  Her son is going to get some memory foam for her bed and see if that improves her symptoms.  She will not sleep on other side.  All emergent symptoms per Shoulder Non-Injury ruled out  Home care advice given

## 2012-01-19 ENCOUNTER — Ambulatory Visit (INDEPENDENT_AMBULATORY_CARE_PROVIDER_SITE_OTHER): Payer: Medicare Other | Admitting: Internal Medicine

## 2012-01-19 VITALS — BP 112/66 | HR 59 | Temp 97.9°F

## 2012-01-19 DIAGNOSIS — M1990 Unspecified osteoarthritis, unspecified site: Secondary | ICD-10-CM

## 2012-01-19 DIAGNOSIS — D509 Iron deficiency anemia, unspecified: Secondary | ICD-10-CM

## 2012-01-19 DIAGNOSIS — I10 Essential (primary) hypertension: Secondary | ICD-10-CM

## 2012-01-19 DIAGNOSIS — E119 Type 2 diabetes mellitus without complications: Secondary | ICD-10-CM

## 2012-01-19 DIAGNOSIS — I4891 Unspecified atrial fibrillation: Secondary | ICD-10-CM

## 2012-01-19 NOTE — Assessment & Plan Note (Signed)
Seems stable, check a digoxin level to r/o overdosing

## 2012-01-19 NOTE — Assessment & Plan Note (Addendum)
h/o iron deficiency anemia, currently taking iron. Additionally, she has occasional blood when she wipes after a bowel movement. Plan:  Monitor anemia with labs. Will call if bleeding is severe

## 2012-01-19 NOTE — Assessment & Plan Note (Signed)
Well-controlled, last BMP normal. Continue with same medicines

## 2012-01-19 NOTE — Progress Notes (Signed)
  Subjective:    Patient ID: Sylvia Duncan, female    DOB: Jun 11, 1912, 78 y.o.   MRN: [id number]  HPI ROV Here with her daughter Elnita Maxwell and granddaughter Claris Che.  History of pneumonia, currently doing well, denies cough or fever  Medication list is reviewed, it is corrected to the best of my ability but Elnita Maxwell and  Claris Che are not completely sure about what meds the pt is actually taking .  Continue with generalized joint aches, the worst of it is in the left shoulder. Denies any fever, myalgias or weight loss. Occasional headache which is not intense. Was taking hydrocodone but it causes some sedation so she's now  taking Tylenol which helps. Not sure if she takes Ultram.  Several months history of soreness at the anterior upper chest, pain is continuous. Shortness of breath at baseline.  Occasionally sees drops of red blood when she wipes after a bowel movement, blood is not mixed with stools. Denies actual rectal pain, abdominal pain, nausea or vomiting.  Hypertension,   no ambulatory BPs.    Past Medical History  Diagnosis Date  . Hypertension   . Osteopenia   . Atrial fibrillation     not coumadin candidate  . Impaired gait   . Gait abnormality     imbalance  . Urinary incontinence   . Diarrhea     chronic  . Barrett's esophagus   . Osteoarthritis   . Edema leg     chronic, LEFT leg, u/s 2009 neg for DVT   Social History:   widow , lives by self , has someone with her 24 h 2 daughter, in [place]; Corrie Dandy and [place] older one is POA  [phone]) lost 2 children   Does not drive anymore Tobacco-- never   ETOH-- no    Review of Systems See history of present illness    Objective:   Physical Exam  General -- alert, well-developed, and not overweight appearing. No apparent distress.  Lungs -- normal respiratory effort, no intercostal retractions, no accessory muscle use, and normal breath sounds.   Heart-- seems regular today.   Extremities-- no pretibial  edema bilaterally ; hands and wrists without evidence of synovitis on exam Neurologic-- alert & oriented X3 and strength normal in all extremities. Psych-- Cognition and judgment appear intact. Alert and cooperative with normal attention span and concentration.  not anxious appearing and not depressed appearing.       Assessment & Plan:   Asked family to review her medication list and  call for any discrepancies.

## 2012-01-19 NOTE — Assessment & Plan Note (Signed)
Recent hemoglobin A1c slightly elevated at 6.7. Recheck A1c.

## 2012-01-19 NOTE — Assessment & Plan Note (Addendum)
Continue with generalized arthralgias, exam is negative for synovitis. Hydrocodone was too strong, currently taking Tylenol which helps. Patient and family unsure if she is taking Ultram but they will let me know Most ofthe pain is at the  left shoulder, will refer to orthopedic surgery for possible local injection. ([facility])

## 2012-01-19 NOTE — Patient Instructions (Addendum)
Tylenol  500 mg OTC 2 tabs a day every 8 hours as needed for pain   

## 2012-01-20 ENCOUNTER — Encounter: Payer: Self-pay | Admitting: Internal Medicine

## 2012-01-23 ENCOUNTER — Telehealth: Payer: Self-pay | Admitting: Internal Medicine

## 2012-01-23 NOTE — Telephone Encounter (Signed)
FL2 form is on your desk. Please advise.

## 2012-01-23 NOTE — Telephone Encounter (Signed)
Ms. Sylvia Duncan states they have to put the pt in a nursing home and she needs Dr. Drue Novel to send an FL2 to [facility] fx# [phone]

## 2012-01-24 ENCOUNTER — Inpatient Hospital Stay (HOSPITAL_COMMUNITY)
Admission: EM | Admit: 2012-01-24 | Discharge: 2012-01-29 | DRG: 066 | Disposition: A | Discharge status: Skilled Nursing Facility | Payer: Medicare Other | Attending: Internal Medicine | Admitting: Internal Medicine

## 2012-01-24 ENCOUNTER — Encounter (HOSPITAL_COMMUNITY): Payer: Self-pay | Admitting: Emergency Medicine

## 2012-01-24 ENCOUNTER — Emergency Department (HOSPITAL_COMMUNITY): Payer: Medicare Other

## 2012-01-24 DIAGNOSIS — H532 Diplopia: Secondary | ICD-10-CM

## 2012-01-24 DIAGNOSIS — Z79899 Other long term (current) drug therapy: Secondary | ICD-10-CM

## 2012-01-24 DIAGNOSIS — I498 Other specified cardiac arrhythmias: Secondary | ICD-10-CM | POA: Diagnosis present

## 2012-01-24 DIAGNOSIS — R600 Localized edema: Secondary | ICD-10-CM | POA: Insufficient documentation

## 2012-01-24 DIAGNOSIS — R32 Unspecified urinary incontinence: Secondary | ICD-10-CM

## 2012-01-24 DIAGNOSIS — I482 Chronic atrial fibrillation, unspecified: Secondary | ICD-10-CM

## 2012-01-24 DIAGNOSIS — Z7982 Long term (current) use of aspirin: Secondary | ICD-10-CM

## 2012-01-24 DIAGNOSIS — M899 Disorder of bone, unspecified: Secondary | ICD-10-CM

## 2012-01-24 DIAGNOSIS — I6350 Cerebral infarction due to unspecified occlusion or stenosis of unspecified cerebral artery: Principal | ICD-10-CM | POA: Diagnosis present

## 2012-01-24 DIAGNOSIS — M8580 Other specified disorders of bone density and structure, unspecified site: Secondary | ICD-10-CM

## 2012-01-24 DIAGNOSIS — I10 Essential (primary) hypertension: Secondary | ICD-10-CM | POA: Diagnosis present

## 2012-01-24 DIAGNOSIS — H35019 Changes in retinal vascular appearance, unspecified eye: Secondary | ICD-10-CM

## 2012-01-24 DIAGNOSIS — Z7902 Long term (current) use of antithrombotics/antiplatelets: Secondary | ICD-10-CM

## 2012-01-24 DIAGNOSIS — E875 Hyperkalemia: Secondary | ICD-10-CM

## 2012-01-24 DIAGNOSIS — R1310 Dysphagia, unspecified: Secondary | ICD-10-CM

## 2012-01-24 DIAGNOSIS — Z823 Family history of stroke: Secondary | ICD-10-CM

## 2012-01-24 DIAGNOSIS — E785 Hyperlipidemia, unspecified: Secondary | ICD-10-CM

## 2012-01-24 DIAGNOSIS — R001 Bradycardia, unspecified: Secondary | ICD-10-CM | POA: Diagnosis present

## 2012-01-24 DIAGNOSIS — I639 Cerebral infarction, unspecified: Secondary | ICD-10-CM | POA: Diagnosis present

## 2012-01-24 DIAGNOSIS — G43009 Migraine without aura, not intractable, without status migrainosus: Secondary | ICD-10-CM

## 2012-01-24 DIAGNOSIS — A630 Anogenital (venereal) warts: Secondary | ICD-10-CM

## 2012-01-24 DIAGNOSIS — E119 Type 2 diabetes mellitus without complications: Secondary | ICD-10-CM | POA: Diagnosis present

## 2012-01-24 DIAGNOSIS — R29898 Other symptoms and signs involving the musculoskeletal system: Secondary | ICD-10-CM | POA: Diagnosis present

## 2012-01-24 DIAGNOSIS — D509 Iron deficiency anemia, unspecified: Secondary | ICD-10-CM

## 2012-01-24 DIAGNOSIS — G459 Transient cerebral ischemic attack, unspecified: Secondary | ICD-10-CM

## 2012-01-24 DIAGNOSIS — Z66 Do not resuscitate: Secondary | ICD-10-CM | POA: Diagnosis present

## 2012-01-24 DIAGNOSIS — I4891 Unspecified atrial fibrillation: Secondary | ICD-10-CM | POA: Diagnosis present

## 2012-01-24 DIAGNOSIS — R197 Diarrhea, unspecified: Secondary | ICD-10-CM | POA: Diagnosis present

## 2012-01-24 DIAGNOSIS — K2270 Barrett's esophagus without dysplasia: Secondary | ICD-10-CM | POA: Diagnosis present

## 2012-01-24 DIAGNOSIS — M1990 Unspecified osteoarthritis, unspecified site: Secondary | ICD-10-CM

## 2012-01-24 DIAGNOSIS — R4789 Other speech disturbances: Secondary | ICD-10-CM | POA: Diagnosis present

## 2012-01-24 DIAGNOSIS — R269 Unspecified abnormalities of gait and mobility: Secondary | ICD-10-CM | POA: Diagnosis present

## 2012-01-24 DIAGNOSIS — N815 Vaginal enterocele: Secondary | ICD-10-CM

## 2012-01-24 LAB — URINALYSIS, MICROSCOPIC ONLY
Bilirubin Urine: NEGATIVE
Glucose, UA: NEGATIVE mg/dL
Ketones, ur: NEGATIVE mg/dL
Nitrite: NEGATIVE
Specific Gravity, Urine: 1.008 (ref 1.005–1.030)
pH: 6.5 (ref 5.0–8.0)

## 2012-01-24 LAB — CBC
HCT: 45 % (ref 36.0–46.0)
Hemoglobin: 15.1 g/dL — ABNORMAL HIGH (ref 12.0–15.0)
MCHC: 33.6 g/dL (ref 30.0–36.0)
MCV: 96.7 fL (ref 78.0–100.0)
Platelets: 223 10*3/uL (ref 150–400)
RBC: 4.58 MIL/uL (ref 3.87–5.11)
RDW: 15.9 % — ABNORMAL HIGH (ref 11.5–15.5)
RDW: 15.6 % — ABNORMAL HIGH (ref 11.5–15.5)
WBC: 10.6 10*3/uL — ABNORMAL HIGH (ref 4.0–10.5)
WBC: 11.4 10*3/uL — ABNORMAL HIGH (ref 4.0–10.5)

## 2012-01-24 LAB — HEMOGLOBIN A1C: Mean Plasma Glucose: 137 mg/dL — ABNORMAL HIGH (ref <117)

## 2012-01-24 LAB — POCT I-STAT, CHEM 8
BUN: 34 mg/dL — ABNORMAL HIGH (ref 6–23)
Calcium, Ion: 1.18 mmol/L (ref 1.13–1.30)
Chloride: 101 mEq/L (ref 96–112)
Glucose, Bld: 174 mg/dL — ABNORMAL HIGH (ref 70–99)
HCT: 46 % (ref 36.0–46.0)
Potassium: 4.3 mEq/L (ref 3.5–5.1)

## 2012-01-24 LAB — CREATININE, SERUM
Creatinine, Ser: 1.06 mg/dL (ref 0.50–1.10)
GFR calc Af Amer: 50 mL/min — ABNORMAL LOW (ref >90)
GFR calc non Af Amer: 43 mL/min — ABNORMAL LOW (ref >90)

## 2012-01-24 LAB — COMPREHENSIVE METABOLIC PANEL
ALT: 28 U/L (ref 0–35)
AST: 29 U/L (ref 0–37)
Alkaline Phosphatase: 56 U/L (ref 39–117)
CO2: 31 mEq/L (ref 19–32)
Chloride: 100 mEq/L (ref 96–112)
GFR calc Af Amer: 46 mL/min — ABNORMAL LOW (ref >90)
GFR calc non Af Amer: 40 mL/min — ABNORMAL LOW (ref >90)
Glucose, Bld: 177 mg/dL — ABNORMAL HIGH (ref 70–99)
Potassium: 4.3 mEq/L (ref 3.5–5.1)
Sodium: 140 mEq/L (ref 135–145)

## 2012-01-24 LAB — GLUCOSE, CAPILLARY: Glucose-Capillary: 98 mg/dL (ref 70–99)

## 2012-01-24 LAB — DIFFERENTIAL
Basophils Absolute: 0 10*3/uL (ref 0.0–0.1)
Lymphocytes Relative: 15 % (ref 12–46)
Lymphs Abs: 1.6 10*3/uL (ref 0.7–4.0)
Neutro Abs: 7.5 10*3/uL (ref 1.7–7.7)
Neutrophils Relative %: 70 % (ref 43–77)

## 2012-01-24 LAB — APTT: aPTT: 25 seconds (ref 24–37)

## 2012-01-24 LAB — PROTIME-INR
INR: 1.04 (ref 0.00–1.49)
Prothrombin Time: 13.8 seconds (ref 11.6–15.2)

## 2012-01-24 LAB — CK TOTAL AND CKMB (NOT AT ARMC): Relative Index: INVALID (ref 0.0–2.5)

## 2012-01-24 MED ORDER — ACETAMINOPHEN 325 MG PO TABS
650.0000 mg | ORAL_TABLET | ORAL | Status: DC | PRN
  Administered 2012-01-24 – 2012-01-29 (×4): 650 mg via ORAL
  Filled 2012-01-24 (×5): qty 2

## 2012-01-24 MED ORDER — METOPROLOL TARTRATE 50 MG PO TABS
50.0000 mg | ORAL_TABLET | Freq: Every evening | ORAL | Status: DC
  Administered 2012-01-25 – 2012-01-27 (×3): 50 mg via ORAL
  Filled 2012-01-24 (×5): qty 1

## 2012-01-24 MED ORDER — ASPIRIN 325 MG PO TABS
325.0000 mg | ORAL_TABLET | Freq: Every day | ORAL | Status: DC
  Administered 2012-01-25: 325 mg via ORAL
  Filled 2012-01-24 (×3): qty 1

## 2012-01-24 MED ORDER — SODIUM CHLORIDE 0.9 % IV SOLN: INTRAVENOUS | Status: DC

## 2012-01-24 MED ORDER — CALCIUM CARBONATE 600 MG PO TABS
600.0000 mg | ORAL_TABLET | Freq: Every day | ORAL | Status: DC
  Filled 2012-01-24: qty 1

## 2012-01-24 MED ORDER — FERROUS SULFATE 325 (65 FE) MG PO TABS
325.0000 mg | ORAL_TABLET | Freq: Every day | ORAL | Status: DC
  Administered 2012-01-25 – 2012-01-29 (×5): 325 mg via ORAL
  Filled 2012-01-24 (×7): qty 1

## 2012-01-24 MED ORDER — DILTIAZEM HCL 90 MG PO TABS
90.0000 mg | ORAL_TABLET | Freq: Three times a day (TID) | ORAL | Status: DC
  Administered 2012-01-24 – 2012-01-28 (×11): 90 mg via ORAL
  Filled 2012-01-24 (×17): qty 1

## 2012-01-24 MED ORDER — CALCIUM CARBONATE 1250 (500 CA) MG PO TABS
1.0000 | ORAL_TABLET | Freq: Every day | ORAL | Status: DC
  Administered 2012-01-25 – 2012-01-29 (×5): 500 mg via ORAL
  Filled 2012-01-24 (×6): qty 1

## 2012-01-24 MED ORDER — SODIUM CHLORIDE 0.9 % IV SOLN
INTRAVENOUS | Status: DC
  Administered 2012-01-24 – 2012-01-26 (×4): via INTRAVENOUS

## 2012-01-24 MED ORDER — DIGOXIN 125 MCG PO TABS
125.0000 ug | ORAL_TABLET | Freq: Every day | ORAL | Status: DC
  Administered 2012-01-24 – 2012-01-27 (×4): 125 ug via ORAL
  Filled 2012-01-24 (×5): qty 1

## 2012-01-24 MED ORDER — ENOXAPARIN SODIUM 40 MG/0.4ML ~~LOC~~ SOLN
40.0000 mg | SUBCUTANEOUS | Status: DC
  Administered 2012-01-24: 40 mg via SUBCUTANEOUS
  Filled 2012-01-24 (×2): qty 0.4

## 2012-01-24 NOTE — Progress Notes (Signed)
Patients daughter requested to be called if any change in patients condition.    Christell Constant [phone]

## 2012-01-24 NOTE — Progress Notes (Signed)
Disposition Note  Sylvia Duncan, is a 78 y.o. female,   MRN: [id number]  -  DOB - 12/04/1912  Outpatient Primary MD for the patient is Willow Ora, MD   Blood pressure 144/85, pulse 88, temperature 97.6 F (36.4 C), temperature source Oral, resp. rate 18, SpO2 95.00%.  Active Problems:  TIA (transient ischemic attack)  HYPERTENSION  Barrett's esophagus  GAIT IMBALANCE  DIARRHEA, CHRONIC  Chronic a-fib  Diabetes mellitus type II  Edema leg    Per EDP:  This is a very functional 78 yo who lives at home with a 24 hour care giver.  Her caregiver saw her in her usual state of health this morning at 1000. Shortly after this she heard her drop her glasses and found her with L side weakness and inability to stand. Her voice was soft, but words clear and appropriate. EMS was called and they activated code stroke at 1025. She arrived at 52. Stroke team here at 1030, EDP exam at 1048. CT showed no acute abnormality  She was seen by Neurology, Dr. Eilleen Kempf, code canceled at 1140 since symptoms are mild, slightly improving, and advanced age. Family present. Aware and agreeable with plan to admit for stroke/TIA w/u  I have requested a neuro tele bed.  Algis Downs, PA-C [facility] Pager: [phone]

## 2012-01-24 NOTE — ED Notes (Signed)
Per EMS: pt from home with care giver and had left sided weakness starting at 1000; pt normally walks and is alert from home with care givers; pt speech clear upon arrival; pt with some left arm weakness noted

## 2012-01-24 NOTE — Telephone Encounter (Signed)
[facility] called about the pt getting a chest x-ray done to get moved into [facility]. Please advise.

## 2012-01-24 NOTE — Progress Notes (Signed)
VASCULAR LAB PRELIMINARY  PRELIMINARY  PRELIMINARY  PRELIMINARY  Carotid duplex completed.    Preliminary report:  Bilateral:  No evidence of hemodynamically significant internal carotid artery stenosis.   Vertebral artery flow is antegrade.     Mieczyslaw Stamas, RVS 01/24/2012, 3:24 PM

## 2012-01-24 NOTE — H&P (Signed)
History and Physical       Hospital Admission Note Date: 01/24/2012  Patient name: Sylvia Duncan Medical record number: [id number] Date of birth: Sep 27, 1912 Age: 78 y.o. Gender: female PCP: Willow Ora, MD   Chief Complaint:  Left-sided weakness today  HPI: Patient is a 78 year old female with history of hypertension, osteopenia, atrial fibrillation, not on Coumadin who lives at home with the caregivers presented to ED with left-sided weakness today. History was obtained from the patient's daughter who does not live with her but was called by the caregiver today. Per daughter's report, patient was in her normal baseline of health till 10 AM after she had breakfast, she dropped her glasses and became weak in her left side/upper extremity. She reports that the caregiver also stated that she had some slurred speech which has improved. Otherwise patient denied any nausea, vomiting, chest pain, syncope or any recent illness.  Review of Systems:  Constitutional: Denies fever, chills, diaphoresis, appetite change and fatigue.  HEENT: Denies photophobia, eye pain, redness, hearing loss, ear pain, congestion, sore throat, rhinorrhea, sneezing, mouth sores, trouble swallowing, neck pain, neck stiffness and tinnitus.   Respiratory: Denies SOB, DOE, cough, chest tightness,  and wheezing.   Cardiovascular: Denies chest pain, palpitations and leg swelling.  Gastrointestinal: Denies nausea, vomiting, abdominal pain, diarrhea, constipation, blood in stool and abdominal distention.  Genitourinary: Denies dysuria, urgency, frequency, hematuria, flank pain and difficulty urinating.  Musculoskeletal: Denies myalgias, back pain, joint swelling, arthralgias. Patient ambulates with a walker  Skin: Denies pallor, rash and wound.  Neurological: Please see history of present illness Hematological: Denies adenopathy. Easy bruising, personal or family bleeding  history  Psychiatric/Behavioral: Denies suicidal ideation, mood changes, confusion, nervousness, sleep disturbance and agitation  Past Medical History: Past Medical History  Diagnosis Date  . Hypertension   . Osteopenia   . Atrial fibrillation     not coumadin candidate  . Impaired gait   . Gait abnormality     imbalance  . Urinary incontinence   . Diarrhea     chronic  . Barrett's esophagus   . Osteoarthritis   . Edema leg     chronic, LEFT leg, u/s 2009 neg for DVT   Past Surgical History  Procedure Date  . Cholecystectomy     had pancreatitis  . Hysterectomy-unknown     Medications: Prior to Admission medications   Medication Sig Start Date End Date Taking? Authorizing Provider  acetaminophen (TYLENOL) 500 MG tablet Take 1,000 mg by mouth every 6 (six) hours as needed. For pain   Yes Historical Provider, MD  aspirin 500 MG EC tablet Take 500 mg by mouth every 6 (six) hours as needed. For pain   Yes Historical Provider, MD  calcium carbonate (OS-CAL) 600 MG TABS Take 600 mg by mouth daily.   Yes Historical Provider, MD  Calcium Polycarbophil (FIBER) 625 MG TABS Take by mouth daily.     Yes Historical Provider, MD  digoxin (LANOXIN) 0.125 MG tablet Take 125 mcg by mouth daily. 04/24/11  Yes [name], MD  diltiazem (CARDIZEM) 90 MG tablet Take 90 mg by mouth 3 (three) times daily. 12/26/11  Yes [name], MD  ferrous sulfate 325 (65 FE) MG tablet Take 325 mg by mouth daily with breakfast.   Yes Historical Provider, MD  furosemide (LASIX) 20 MG tablet Take 20 mg by mouth daily. 06/26/11  Yes [name], MD  metoprolol tartrate (LOPRESSOR) 25 MG tablet Take 50 mg by mouth  every evening. 04/24/11  Yes [name], MD  potassium chloride (K-DUR) 10 MEQ tablet Take 10 mEq by mouth daily. 07/04/11  Yes [name], MD  traMADol (ULTRAM) 50 MG tablet Take 50 mg by mouth every 6 (six) hours as needed. For pain   Yes Historical Provider, MD    Allergies:  No Known Allergies  Social  History:  reports that she has never smoked. She does not have any smokeless tobacco history on file. She reports that she does not drink alcohol or use illicit drugs. The patient lives at home, has 24/7 caregivers and daughter lives a block away.  Family History: Family history reviewed, both parents are deceased. Patient's daughter had stroke.  Physical Exam: Blood pressure 144/85, pulse 88, temperature 97.6 F (36.4 C), temperature source Oral, resp. rate 18, SpO2 95.00%. General: Alert, awake, oriented, in no acute distress, frail. No dysarthria or facial drooping HEENT: normocephalic, atraumatic, anicteric sclera, pink conjunctiva, pupils equal and reactive to light and accomodation, oropharynx clear Neck: supple, no masses or lymphadenopathy, no goiter, no bruits  Heart: Irregularly irregular  Lungs: Clear to auscultation bilaterally, no wheezing, rales or rhonchi. Abdomen: Soft, nontender, nondistended, positive bowel sounds, no masses. Extremities: No clubbing, cyanosis or edema with positive pedal pulses. Neuro: Grossly intact, no focal neurological deficits, strength 5/5 upper and lower extremities bilaterally,  Psych: alert and oriented x 3, normal mood and affect Skin: no rashes or lesions, warm and dry   LABS on Admission:  Basic Metabolic Panel:  Lab 01/24/12 0454 01/24/12 1052  NA 141 140  K 4.3 4.3  CL 101 100  CO2 -- 31  GLUCOSE 174* 177*  BUN 34* 34*  CREATININE 1.30* 1.13*  CALCIUM -- 10.0  MG -- --  PHOS -- --   Liver Function Tests:  Lab 01/24/12 1052  AST 29  ALT 28  ALKPHOS 56  BILITOT 0.4  PROT 7.9  ALBUMIN 3.7  CBC:  Lab 01/24/12 1058 01/24/12 1052  WBC -- 10.6*  NEUTROABS -- 7.5  HGB 15.6* 14.8  HCT 46.0 44.3  MCV -- 96.7  PLT -- 223   Cardiac Enzymes:  Lab 01/24/12 1053  CKTOTAL 31  CKMB 2.2  CKMBINDEX --  TROPONINI <0.30   BNP: No components found with this basename: POCBNP:2 CBG: No results found for this basename:  GLUCAP:2 in the last 168 hours   Radiological Exams on Admission: Ct Head Wo Contrast  01/24/2012  *RADIOLOGY REPORT*  Clinical Data: Left-sided weakness.  Code stroke.  CT HEAD WITHOUT CONTRAST  Technique:  Contiguous axial images were obtained from the base of the skull through the vertex without contrast.  Comparison: Head CT 04/04/2011  Findings: No acute intracranial hemorrhage.  No focal mass lesion. No CT evidence of acute infarction.   No midline shift or mass effect.  No hydrocephalus.  Basilar cisterns are patent.  There is generalized cortical atrophy and ventricular dilatation which is unchanged from prior.  Paranasal sinuses and mastoid air cells are clear.  Orbits are normal.  IMPRESSION:  1.  No CT evidence of infarction or intracranial hemorrhage. 2.  Atrophy and microvascular disease similar to prior.  Findings conveyed to Dr. Nino Parsley 01/19/2012 at 1115 hours.   Original Report Authenticated By: Genevive Bi, M.D.     Assessment/Plan  .TIA (transient ischemic attack) - Admit to telemetry, patient was initially seen by neurology as code stroke, she was not considered a TPA candidate as her symptoms were mild and slightly  improving. - She does have risk factors of DM, hypertension, atrial fibrillation, patient will be admitted for stroke/TIA workup -   Obtain MRI, 2-D echo, carotid Dopplers. Did not order MRA per neurology recommendations. - Patient appears to be on aspirin at home, may need to be on Plavix if she does have a stroke. She's not a Coumadin candidate and patient's daughter does not want Coumadin either.  - PT, OT, speech evaluation, lipid panel.  Marland KitchenHYPERTENSION - Continue Lopressor, Cardizem, but hold Lasix due to mild acute renal insufficiency. Placed on gentle hydration.   .Barrett's esophagus  .GAIT IMBALANCE: Will await PT OT evaluation, patient's daughter is interested in skilled nursing facility.   .Chronic a-fib:  - Continue digoxin, Lopressor,  Cardizem, aspirin. Patient is not a Coumadin candidate.  .Diabetes mellitus type II - Obtain hemoglobin A1c. patient is not on any oral hypoglycemics or insulin at home   DVT prophylaxis:  SCDs   CODE STATUS:  DO NOT RESUSCITATE   Further plan will depend as patient's clinical course evolves and further radiologic and laboratory data become available.   Time Spent on Admission: 1 hour   RAI,RIPUDEEP M.D. [facility] 01/24/2012, 1:30 PM Pager: [phone]  If 7PM-7AM, please contact night-coverage [email or web address] Password [email or web address]

## 2012-01-24 NOTE — Code Documentation (Signed)
78yo female who lives at home with 24 hour care. She has recently been in a AL, but was not satisfied with the care and went back to her home.  They are arranging for her to go to a different facility in the near future.  Her caregiver saw her in her usual state of health this morning at 1000. Shortly after this she heard her drop her glasses and found her with L side weakness and inability to stand. Her voice was soft, but words clear and appropriate.  EMS was called and they activated code stroke at 1025. She arrived at 75. Stroke team here at 1030, EDP exam at 1048.  CT showed no acute abnormality. NIHSS is 4:  One each for LUE and LLE drift, sensory deficit on L and L neglect with double simultaneous stimulation.  Seen by Dr. Eilleen Kempf; code canceled at 1140 since symptoms are mild, slightly improving, and advanced age.  Family present. Aware and agreeable with plan to admit for stroke w/u.

## 2012-01-24 NOTE — ED Provider Notes (Addendum)
History     CSN: [id number]  Arrival date & time 01/24/12  1055   First MD Initiated Contact with Patient 01/24/12 1101      Chief Complaint  Patient presents with  . Code Stroke    (Consider location/radiation/quality/duration/timing/severity/associated sxs/prior treatment) The history is provided by the patient, the EMS personnel and a relative.   she has a history of atrial fibrillation.  She is not anticoagulated.  She has a caregiver who comes to her house.  Everyday.  Today at around 10 AM,  she dropped.  Her glasses and became weak in her left upper extremity, the caregiver says that she had slurred speech, as well.  During transport.  Her speech has improved, and her left arm weakness.  Persists.  She denies pain anywhere.  She denies nausea, vomiting, or vision changes.  She denies recent illness.  Level V caveat applies for code stroke, and urgent need for evaluation and treatment.  Past Medical History  Diagnosis Date  . Hypertension   . Osteopenia   . Atrial fibrillation     not coumadin candidate  . Impaired gait   . Gait abnormality     imbalance  . Urinary incontinence   . Diarrhea     chronic  . Barrett's esophagus   . Osteoarthritis   . Edema leg     chronic, LEFT leg, u/s 2009 neg for DVT    Past Surgical History  Procedure Date  . Cholecystectomy     had pancreatitis  . Hysterectomy-unknown     History reviewed. No pertinent family history.  History  Substance Use Topics  . Smoking status: Never Smoker   . Smokeless tobacco: Not on file  . Alcohol Use: No    OB History    Grav Para Term Preterm Abortions TAB SAB Ect Mult Living                  Review of Systems  Constitutional: Negative for fever and chills.  HENT: Negative for neck pain.   Eyes: Negative for visual disturbance.  Cardiovascular: Negative for chest pain.  Gastrointestinal: Negative for nausea, vomiting and abdominal pain.  Genitourinary: Negative for dysuria.    Musculoskeletal: Negative for back pain.  Neurological: Positive for speech difficulty and weakness. Negative for headaches.  Psychiatric/Behavioral: Negative for confusion.  All other systems reviewed and are negative.    Allergies  Review of patient's allergies indicates no known allergies.  Home Medications   Current Outpatient Rx  Name Route Sig Dispense Refill  . ACETAMINOPHEN 500 MG PO TABS Oral Take 1,000 mg by mouth every 6 (six) hours as needed. For pain    . ASPIRIN 500 MG PO TBEC Oral Take 500 mg by mouth every 6 (six) hours as needed. For pain    . CALCIUM CARBONATE 600 MG PO TABS Oral Take 600 mg by mouth daily.    Marland Kitchen FIBER 625 MG PO TABS Oral Take by mouth daily.      Marland Kitchen DIGOXIN 0.125 MG PO TABS Oral Take 125 mcg by mouth daily.    Marland Kitchen DILTIAZEM HCL 90 MG PO TABS Oral Take 90 mg by mouth 3 (three) times daily.    Marland Kitchen FERROUS SULFATE 325 (65 FE) MG PO TABS Oral Take 325 mg by mouth daily with breakfast.    . FUROSEMIDE 20 MG PO TABS Oral Take 20 mg by mouth daily.    Marland Kitchen METOPROLOL TARTRATE 25 MG PO TABS Oral Take 50 mg by  mouth every evening.    Marland Kitchen POTASSIUM CHLORIDE ER 10 MEQ PO TBCR Oral Take 10 mEq by mouth daily.    . TRAMADOL HCL 50 MG PO TABS Oral Take 50 mg by mouth every 6 (six) hours as needed. For pain      BP 144/85  Pulse 88  Temp 97.6 F (36.4 C) (Oral)  Resp 18  SpO2 95%  Physical Exam  Nursing note and vitals reviewed. Constitutional: She is oriented to person, place, and time. No distress.       Frail elderly, female, in no distress  HENT:  Head: Normocephalic and atraumatic.  Eyes: Conjunctivae are normal.  Neck: Normal range of motion. Neck supple. No JVD present.       No bruits  Cardiovascular: Normal rate and intact distal pulses.   No murmur heard.      Irregular  Pulmonary/Chest: Effort normal and breath sounds normal. No respiratory distress.  Abdominal: She exhibits no distension.  Neurological: She is alert and oriented to person,  place, and time. No cranial nerve deficit.       Right upper extremity strength is 5 over 5 left upper extremity strength is 3/5  Skin: Skin is warm and dry.  Psychiatric: She has a normal mood and affect. Thought content normal.    ED Course  Procedures (including critical care time) 78 year old, female, with atrial fibrillation.  He presents emergency department with left-sided weakness consistent with a stroke.  Last seen normal.  Was 10 AM.  She was sent for CAT scan for evaluation.  Labs Reviewed  CBC - Abnormal; Notable for the following:    WBC 10.6 (*)     RDW 15.9 (*)     All other components within normal limits  DIFFERENTIAL - Abnormal; Notable for the following:    Monocytes Relative 14 (*)     Monocytes Absolute 1.5 (*)     All other components within normal limits  COMPREHENSIVE METABOLIC PANEL - Abnormal; Notable for the following:    Glucose, Bld 177 (*)     BUN 34 (*)     Creatinine, Ser 1.13 (*)     GFR calc non Af Amer 40 (*)     GFR calc Af Amer 46 (*)     All other components within normal limits  POCT I-STAT, CHEM 8 - Abnormal; Notable for the following:    BUN 34 (*)     Creatinine, Ser 1.30 (*)     Glucose, Bld 174 (*)     Hemoglobin 15.6 (*)     All other components within normal limits  PROTIME-INR  APTT  CK TOTAL AND CKMB  TROPONIN I   Ct Head Wo Contrast  01/24/2012  *RADIOLOGY REPORT*  Clinical Data: Left-sided weakness.  Code stroke.  CT HEAD WITHOUT CONTRAST  Technique:  Contiguous axial images were obtained from the base of the skull through the vertex without contrast.  Comparison: Head CT 04/04/2011  Findings: No acute intracranial hemorrhage.  No focal mass lesion. No CT evidence of acute infarction.   No midline shift or mass effect.  No hydrocephalus.  Basilar cisterns are patent.  There is generalized cortical atrophy and ventricular dilatation which is unchanged from prior.  Paranasal sinuses and mastoid air cells are clear.  Orbits are  normal.  IMPRESSION:  1.  No CT evidence of infarction or intracranial hemorrhage. 2.  Atrophy and microvascular disease similar to prior.  Findings conveyed to Dr. Nino Parsley 01/19/2012 at 1115  hours.   Original Report Authenticated By: Genevive Bi, M.D.      No diagnosis found.  Discussed the CAT scan with Dr. Ty Hilts, and the case with the neurologist.  The patient is not a TPA candidate.  We will admit the hospital for further evaluation  12:12 PM Spoke with [facility].  They will admit.  The patient to the hospital for further evaluation and treatment  MDM  TIA. Atrial fibrillation        Cheri Guppy, MD 01/24/12 8295  Cheri Guppy, MD 01/24/12 1213

## 2012-01-24 NOTE — Consult Note (Signed)
Referring Physician:  Dr. Berton Bon- ED    Code stroke Charts, medications, labs and images reviewed,  Discussed with family member Chief Complaint: Left sided weakness   HPI: Sylvia Duncan is an 78 y.o. female  Who requires help with  assisted daily living , and ambulates with a walker.  Was fine this morning,  after she woke up she noticed  some left sided weakness. Code stroke was called in.  She has chronic LE weakness and she has diagnosis of gait imbalance.  She denied headaches, no dizziness, no nausea no vomiting, no chest pain.  Reviewed medications. She is not on coumadin, Monitor displaying Afib  LSN: 10:00  AM tPA Given:  No NIH scale 2 or 3 Multiple chronic issues.   Past Medical History  Diagnosis Date  . Hypertension   . Osteopenia   . Atrial fibrillation     not coumadin candidate  . Impaired gait   . Gait abnormality     imbalance  . Urinary incontinence   . Diarrhea     chronic  . Barrett's esophagus   . Osteoarthritis   . Edema leg     chronic, LEFT leg, u/s 2009 neg for DVT    Past Surgical History  Procedure Date  . Cholecystectomy     had pancreatitis  . Hysterectomy-unknown     History reviewed. No pertinent family history. Social History:  reports that she has never smoked. She does not have any smokeless tobacco history on file. She reports that she does not drink alcohol or use illicit drugs.  Allergies: No Known Allergies  Medications: Scheduled:    Past Surgical History  Procedure Date  . Cholecystectomy     had pancreatitis  . Hysterectomy-unknown     Physical Examination: Blood pressure 144/85, pulse 88, temperature 97.6 F (36.4 C), temperature source Oral, resp. rate 18, SpO2 95.00%.  General Examination:    HEENT-  Normocephalic, no lesions, without obvious abnormality.  Normal external eye and conjunctiva.  Normal TM's bilaterally.  Neck supple with no masses, nodes, nodules or enlargement., no carotid  bruit, Cardiovascular - Diastolic murmur, no rubs and or gallops Lungs - CTA Abdomen -  Soft, non tender and non distended, bowel sounds present Skin no rash, keratosis on the frontal head  Extremities - no Edema,   Neurologic Examination: Mental Status: Alert, oriented, thought content appropriate.  Speech fluent without evidence of aphasia.  Able to follow 3 step commands without difficulty. Cranial Nerves: II: visual fields grossly normal, pupils equal, round, reactive to light and accommodation III,IV, VI: ptosis not present, extra-ocular motions intact bilaterally V,VII: smile symmetric, facial light touch sensation normal bilaterally VIII: hearing normal bilaterally IX,X: gag reflex present XI: trapezius strength/neck flexion strength normal bilaterally XII: tongue strength normal  Motor: Right : Upper extremity   5/5    Left:     Upper extremity   5-/5  Lower extremity   5/5     Lower extremity   5-/5 Mild drift on the left UE but could hold on to antigravity position Tone normal Significant  atrophy noted Sensory: Pinprick intact throughout, bilaterally Deficits to light touch Deep Tendon Reflexes: 1+ and symmetric throughout Plantars: Right: downgoing   Left: downgoing    Laboratory Studies: Basic Metabolic Panel:  Lab 01/24/12 2130 01/24/12 1052  NA 141 140  K 4.3 4.3  CL 101 100  CO2 -- 31  GLUCOSE 174* 177*  BUN 34* 34*  CREATININE 1.30* 1.13*  CALCIUM --  10.0  MG -- --  PHOS -- --    Liver Function Tests:  Lab 01/24/12 1052  AST 29  ALT 28  ALKPHOS 56  BILITOT 0.4  PROT 7.9  ALBUMIN 3.7   No results found for this basename: LIPASE:5,AMYLASE:5 in the last 168 hours No results found for this basename: AMMONIA:3 in the last 168 hours  CBC:  Lab 01/24/12 1058 01/24/12 1052 01/19/12 1209  WBC -- 10.6* --  NEUTROABS -- 7.5 --  HGB 15.6* 14.8 13.0  HCT 46.0 44.3 --  MCV -- 96.7 --  PLT -- 223 --    Cardiac Enzymes:  Lab 01/24/12 1053   CKTOTAL 31  CKMB 2.2  CKMBINDEX --  TROPONINI <0.30    BNP: No components found with this basename: POCBNP:5  CBG: No results found for this basename: GLUCAP:5 in the last 168 hours  Microbiology: Results for orders placed during the hospital encounter of 10/13/11  CULTURE, BLOOD (ROUTINE X 2)     Status: Normal   Collection Time   10/13/11  9:30 PM      Component Value Range Status Comment   Specimen Description Blood   Final    Special Requests NONE   Final    Culture  Setup Time [id number]   Final    Culture NO GROWTH 5 DAYS   Final    Report Status 10/20/2011 FINAL   Final   CULTURE, BLOOD (ROUTINE X 2)     Status: Normal   Collection Time   10/14/11  2:14 AM      Component Value Range Status Comment   Specimen Description Blood   Final    Special Requests NONE   Final    Culture  Setup Time [id number]   Final    Culture NO GROWTH 5 DAYS   Final    Report Status 10/20/2011 FINAL   Final   CULTURE, EXPECTORATED SPUTUM-ASSESSMENT     Status: Normal   Collection Time   10/14/11 10:03 AM      Component Value Range Status Comment   Specimen Description SPUTUM   Final    Special Requests NONE   Final    Sputum evaluation     Final    Value: THIS SPECIMEN IS ACCEPTABLE. RESPIRATORY CULTURE REPORT TO FOLLOW.   Report Status 10/14/2011 FINAL   Final   CULTURE, RESPIRATORY     Status: Normal   Collection Time   10/14/11 10:03 AM      Component Value Range Status Comment   Specimen Description SPUTUM   Final    Special Requests NONE   Final    Gram Stain     Final    Value: FEW WBC PRESENT, PREDOMINANTLY PMN     RARE SQUAMOUS EPITHELIAL CELLS PRESENT     RARE GRAM POSITIVE COCCI IN PAIRS   Culture NORMAL OROPHARYNGEAL FLORA   Final    Report Status 10/16/2011 FINAL   Final     Coagulation Studies:  Basename 01/24/12 1052  LABPROT 13.8  INR 1.04    Urinalysis: No results found for this basename:  COLORURINE:2,APPERANCEUR:2,LABSPEC:2,PHURINE:2,GLUCOSEU:2,HGBUR:2,BILIRUBINUR:2,KETONESUR:2,PROTEINUR:2,UROBILINOGEN:2,NITRITE:2,LEUKOCYTESUR:2 in the last 168 hours  Lipid Panel:    Component Value Date/Time   CHOL 155 04/16/2008 0845   TRIG 146 04/16/2008 0845   HDL 38.6* 04/16/2008 0845   CHOLHDL 4.0 CALC 04/16/2008 0845   VLDL 29 04/16/2008 0845   LDLCALC 87 04/16/2008 0845    HgbA1C: Lab Results  Component Value Date   HGBA1C 6.5  01/19/2012    Urine Drug Screen:   No results found for this basename: labopia, cocainscrnur, labbenz, amphetmu, thcu, labbarb     Alcohol Level: No results found for this basename: ETH:2 in the last 168 hours  Other results: EKG: normal EKG, normal sinus rhythm, unchanged from previous tracings.  Imaging: Ct Head Wo Contrast  01/24/2012  *RADIOLOGY REPORT*  Clinical Data: Left-sided weakness.  Code stroke.  CT HEAD WITHOUT CONTRAST  Technique:  Contiguous axial images were obtained from the base of the skull through the vertex without contrast.  Comparison: Head CT 04/04/2011  Findings: No acute intracranial hemorrhage.  No focal mass lesion. No CT evidence of acute infarction.   No midline shift or mass effect.  No hydrocephalus.  Basilar cisterns are patent.  There is generalized cortical atrophy and ventricular dilatation which is unchanged from prior.  Paranasal sinuses and mastoid air cells are clear.  Orbits are normal.  IMPRESSION:  1.  No CT evidence of infarction or intracranial hemorrhage. 2.  Atrophy and microvascular disease similar to prior.  Findings conveyed to Dr. Nino Parsley 01/19/2012 at 1115 hours.   Original Report Authenticated By: Genevive Bi, M.D.             Assessment: 78 y.o. female  With multiple chronic deficits, comes with left sided weakness, NIH was 2-3  ( because of some inconsistency)  Not a TPA candidate Stroke Risk Factors -  Hypertension Afib Vascular disease Cardiac disease  Plan: 1 Stroke labs 2.  MRI, 3) MRA of the head not needed because even if she had an occlusion, she is not a candidate for any surgical intervention.  3. PT consult, OT consult, Speech consult 4. Echocardiogram 5. Carotid dopplers 6. Prophylactic therapy- 7. Risk factor modification, Afib, but not a coumadin candidate given risk and benefits 8. Telemetry monitoring 9. Frequent neuro checks 10) Admit to Neuro service      01/24/2012, 11:42 AM  Thank you for the consult Meade Hogeland V-P Eilleen Kempf., MD., Ph.D.,MS 01/24/2012 12:04 PM

## 2012-01-25 ENCOUNTER — Inpatient Hospital Stay (HOSPITAL_COMMUNITY): Payer: Medicare Other

## 2012-01-25 DIAGNOSIS — E785 Hyperlipidemia, unspecified: Secondary | ICD-10-CM

## 2012-01-25 DIAGNOSIS — I10 Essential (primary) hypertension: Secondary | ICD-10-CM

## 2012-01-25 DIAGNOSIS — E875 Hyperkalemia: Secondary | ICD-10-CM

## 2012-01-25 LAB — GLUCOSE, CAPILLARY
Glucose-Capillary: 92 mg/dL (ref 70–99)
Glucose-Capillary: 167 mg/dL — ABNORMAL HIGH (ref 70–99)
Glucose-Capillary: 135 mg/dL — ABNORMAL HIGH (ref 70–99)

## 2012-01-25 LAB — BASIC METABOLIC PANEL
Chloride: 99 mEq/L (ref 96–112)
GFR calc Af Amer: 40 mL/min — ABNORMAL LOW (ref >90)
GFR calc non Af Amer: 34 mL/min — ABNORMAL LOW (ref >90)
Potassium: 5.2 mEq/L — ABNORMAL HIGH (ref 3.5–5.1)
Sodium: 139 mEq/L (ref 135–145)

## 2012-01-25 LAB — CBC
HCT: 48.4 % — ABNORMAL HIGH (ref 36.0–46.0)
Hemoglobin: 16.1 g/dL — ABNORMAL HIGH (ref 12.0–15.0)
MCH: 32.1 pg (ref 26.0–34.0)
MCHC: 33.3 g/dL (ref 30.0–36.0)
MCV: 96.6 fL (ref 78.0–100.0)
Platelets: 224 10*3/uL (ref 150–400)
RBC: 5.01 MIL/uL (ref 3.87–5.11)
RDW: 15.7 % — ABNORMAL HIGH (ref 11.5–15.5)
WBC: 10.6 10*3/uL — ABNORMAL HIGH (ref 4.0–10.5)

## 2012-01-25 LAB — LIPID PANEL
Cholesterol: 230 mg/dL — ABNORMAL HIGH (ref 0–200)
HDL: 55 mg/dL
LDL Cholesterol: 128 mg/dL — ABNORMAL HIGH (ref 0–99)
Total CHOL/HDL Ratio: 4.2 ratio
Triglycerides: 235 mg/dL — ABNORMAL HIGH
VLDL: 47 mg/dL — ABNORMAL HIGH (ref 0–40)

## 2012-01-25 LAB — POTASSIUM: Potassium: 4.5 mEq/L (ref 3.5–5.1)

## 2012-01-25 MED ORDER — ENSURE COMPLETE PO LIQD
237.0000 mL | Freq: Two times a day (BID) | ORAL | Status: DC
  Administered 2012-01-25 – 2012-01-29 (×7): 237 mL via ORAL

## 2012-01-25 MED ORDER — ENOXAPARIN SODIUM 30 MG/0.3ML ~~LOC~~ SOLN
30.0000 mg | SUBCUTANEOUS | Status: DC
  Administered 2012-01-25 – 2012-01-28 (×4): 30 mg via SUBCUTANEOUS
  Filled 2012-01-25 (×5): qty 0.3

## 2012-01-25 MED ORDER — CLOPIDOGREL BISULFATE 75 MG PO TABS
75.0000 mg | ORAL_TABLET | Freq: Every day | ORAL | Status: DC
  Administered 2012-01-25 – 2012-01-29 (×5): 75 mg via ORAL
  Filled 2012-01-25 (×6): qty 1

## 2012-01-25 MED ORDER — SODIUM POLYSTYRENE SULFONATE 15 GM/60ML PO SUSP
30.0000 g | Freq: Once | ORAL | Status: AC
  Administered 2012-01-25: 30 g via ORAL
  Filled 2012-01-25: qty 120

## 2012-01-25 MED ORDER — ATORVASTATIN CALCIUM 10 MG PO TABS
10.0000 mg | ORAL_TABLET | Freq: Every day | ORAL | Status: DC
  Administered 2012-01-25 – 2012-01-28 (×4): 10 mg via ORAL
  Filled 2012-01-25 (×5): qty 1

## 2012-01-25 NOTE — Progress Notes (Signed)
Pt had 2 seconds pause on tele. VS stable. Schorr, NP paged and made aware. Will continue to monitor pt.

## 2012-01-25 NOTE — Progress Notes (Signed)
Patient ID: Sylvia Duncan  female  ZOX:[id number]    DOB: 04-30-1913    DOA: 01/24/2012  PCP: Willow Ora, MD  Subjective: Patient seen and examined earlier this a.m., left-sided weakness improved, appears to be at her baseline. Denies any headache, nausea, vomiting, abdominal pain,CP, shortness of breath.  Objective: Weight change:  No intake or output data in the 24 hours ending 01/25/12 1127 Blood pressure 143/81, pulse 72, temperature 98.6 F (37 C), temperature source Oral, resp. rate 18, height 5\' 2"  (1.575 m), weight 52.708 kg (116 lb 3.2 oz), SpO2 97.00%.  Physical Exam: General: Alert and awake, oriented x3, not in any acute distress. HEENT: anicteric sclera, pupils reactive to light and accommodation, EOMI CVS: S1-S2 clear, no murmur rubs or gallops Chest: clear to auscultation bilaterally, no wheezing, rales or rhonchi Abdomen: soft nontender, nondistended, normal bowel sounds, no organomegaly Extremities: no cyanosis, clubbing or edema noted bilaterally  Lab Results: Basic Metabolic Panel:  Lab 01/25/12 8119 01/24/12 1517 01/24/12 1058 01/24/12 1052  NA 139 -- 141 --  K 5.2* -- 4.3 --  CL 99 -- 101 --  CO2 34* -- -- 31  GLUCOSE 100* -- 174* --  BUN 30* -- 34* --  CREATININE 1.28* 1.06 -- --  CALCIUM 9.6 -- -- 10.0  MG -- -- -- --  PHOS -- -- -- --   Liver Function Tests:  Lab 01/24/12 1052  AST 29  ALT 28  ALKPHOS 56  BILITOT 0.4  PROT 7.9  ALBUMIN 3.7   CBC:  Lab 01/25/12 0748 01/24/12 1517 01/24/12 1052  WBC 10.6* 11.4* --  NEUTROABS -- -- 7.5  HGB 16.1* 15.1* --  HCT 48.4* 45.0 --  MCV 96.6 95.5 --  PLT 224 231 --   Cardiac Enzymes:  Lab 01/24/12 1053  CKTOTAL 31  CKMB 2.2  CKMBINDEX --  TROPONINI <0.30   BNP: No components found with this basename: POCBNP:2 CBG:  Lab 01/25/12 0730 01/24/12 2225 01/24/12 1651  GLUCAP 92 130* 98     Micro Results: No results found for this or any previous visit (from the past 240  hour(s)).  Studies/Results: Ct Head Wo Contrast  01/24/2012  *RADIOLOGY REPORT*  Clinical Data: Left-sided weakness.  Code stroke.  CT HEAD WITHOUT CONTRAST  Technique:  Contiguous axial images were obtained from the base of the skull through the vertex without contrast.  Comparison: Head CT 04/04/2011  Findings: No acute intracranial hemorrhage.  No focal mass lesion. No CT evidence of acute infarction.   No midline shift or mass effect.  No hydrocephalus.  Basilar cisterns are patent.  There is generalized cortical atrophy and ventricular dilatation which is unchanged from prior.  Paranasal sinuses and mastoid air cells are clear.  Orbits are normal.  IMPRESSION:  1.  No CT evidence of infarction or intracranial hemorrhage. 2.  Atrophy and microvascular disease similar to prior.  Findings conveyed to Dr. Nino Parsley 01/19/2012 at 1115 hours.   Original Report Authenticated By: Genevive Bi, M.D.     Medications: Scheduled Meds:   . aspirin  325 mg Oral Daily  . calcium carbonate  1 tablet Oral Daily  . digoxin  125 mcg Oral Daily  . diltiazem  90 mg Oral TID  . enoxaparin  40 mg Subcutaneous Q24H  . ferrous sulfate  325 mg Oral Q breakfast  . metoprolol tartrate  50 mg Oral QPM  . sodium polystyrene  30 g Oral Once  . DISCONTD: sodium chloride  Intravenous STAT  . DISCONTD: calcium carbonate  600 mg Oral Daily   Continuous Infusions:   . sodium chloride 75 mL/hr at 01/25/12 0016   Carotid Dopplers: Preliminary report: Bilateral: No evidence of hemodynamically significant internal carotid artery stenosis. Vertebral artery flow is antegrade.    Assessment/Plan: Principal Problem:  *TIA (transient ischemic attack) - Stroke workup in progress, Dopplers with no evidence of significant internal carotid artery stenosis. MRI of the brain, 2-D echocardiogram pending - On aspirin currently. Will likely need Plavix for stroke prevention. - Started statins for hyperlipidemia  Active  Problems:  HYPERTENSION: Stable for now  Hyperlipidemia: Cholesterol 2:30, triglycerides 235, LDL 128 - Started on Lipitor today   Chronic a-fib: Rate controlled however 2 sec sinus pause today - Will obtain 12-lead EKG, hold BB if more episodes   Diabetes mellitus type II: diet controlled - Hemoglobin A1c 6.4   Hyperkalemia: Given one dose of Kayexalate  DVT Prophylaxis:lovenox  Code Status:DNR  Disposition: PT eval pending for disposition assessment   LOS: 1 day   RAI,RIPUDEEP M.D. [facility] 01/25/2012, 11:27 AM Pager: [phone]  If 7PM-7AM, please contact night-coverage [email or web address] Password [email or web address]

## 2012-01-25 NOTE — Progress Notes (Signed)
  Echocardiogram 2D Echocardiogram has been performed.  Stephanne Greeley FRANCES 01/25/2012, 11:29 AM

## 2012-01-25 NOTE — Evaluation (Signed)
Physical Therapy Evaluation Patient Details Name: Sylvia Duncan MRN: [id number] DOB: [birth year] Today's Date: 01/25/2012 Time: 0832-0900 PT Time Calculation (min): 28 min  PT Assessment / Plan / Recommendation Clinical Impression  Pt is a 78 yo female admitted with history of hypertension, osteopenia, atrial fibrillation, not on Coumadin who lives at home with the caregivers presented to ED with left-sided weakness and slurred speech. Pt presents to physical therapy evaluation with decreased strength, decresed mobility, and decreased balance. Pt will benefit from skilled physical therapy in the acute care setting to address these deficits to promote independence and decrease caregiver burden. Recommending ST-SNF for rehab at discharge.    PT Assessment  Patient needs continued PT services    Follow Up Recommendations  Skilled nursing facility;Supervision for mobility/OOB (ST-SNF)    Barriers to Discharge        Equipment Recommendations  None recommended by PT    Recommendations for Other Services     Frequency Min 3X/week    Precautions / Restrictions Precautions Precautions: Fall Restrictions Weight Bearing Restrictions: No     Mobility  Bed Mobility Bed Mobility: Supine to Sit;Sitting - Scoot to Edge of Bed Supine to Sit: 6: Modified independent (Device/Increase time);With rails;HOB flat Sitting - Scoot to Edge of Bed: 5: Supervision;With rail Details for Bed Mobility Assistance: Pt requried verbal cues for sequencing during bed mobility. Transfers Transfers: Sit to Stand;Stand to Sit Sit to Stand: 4: Min assist Stand to Sit: 4: Min assist Details for Transfer Assistance: Pt required verbal cueing for safe hand placement and facilitation at pelvis and upper trunk to come to full upright standing. Ambulation/Gait Ambulation/Gait Assistance: 4: Min assist Ambulation Distance (Feet): 10 Feet Assistive device: Rolling walker Ambulation/Gait Assistance Details: Pt  requried verbal cueing for upright posture and encouragement to continue ambulating a bit farther. Pt reports not ambulating further than household distance prior to admission. Chair brought up behind after ambulating 73ft due to pt fatigue. Gait Pattern: Narrow base of support;Trunk flexed;Decreased stride length;Shuffle General Gait Details: bil decreased step length Stairs: No Modified Rankin (Stroke Patients Only) Pre-Morbid Rankin Score: Moderate disability Modified Rankin: Moderately severe disability    Exercises     PT Diagnosis: Difficulty walking;Abnormality of gait;Generalized weakness  PT Problem List: Decreased strength;Decreased activity tolerance;Decreased balance;Decreased mobility;Decreased knowledge of use of DME PT Treatment Interventions: DME instruction;Gait training;Stair training;Functional mobility training;Therapeutic activities;Therapeutic exercise;Balance training;Neuromuscular re-education;Patient/family education   PT Goals Acute Rehab PT Goals PT Goal Formulation: With patient Time For Goal Achievement: 02/08/12 Potential to Achieve Goals: Good Pt will Roll Supine to Right Side: with modified independence PT Goal: Rolling Supine to Right Side - Progress: Goal set today Pt will Roll Supine to Left Side: with modified independence PT Goal: Rolling Supine to Left Side - Progress: Goal set today Pt will Sit at Edge of Bed: with supervision;3-5 min;with no upper extremity support PT Goal: Sit at Edge Of Bed - Progress: Goal set today Pt will go Sit to Stand: with supervision PT Goal: Sit to Stand - Progress: Goal set today Pt will go Stand to Sit: with supervision PT Goal: Stand to Sit - Progress: Goal set today Pt will Transfer Bed to Chair/Chair to Bed: with supervision PT Transfer Goal: Bed to Chair/Chair to Bed - Progress: Goal set today Pt will Ambulate: 51 - 150 feet;with supervision;with least restrictive assistive device PT Goal: Ambulate - Progress:  Goal set today  Visit Information  Last PT Received On: 01/25/12 Assistance Needed: +1 (+2 for chair  follow)    Subjective Data  Subjective: "I am feeling better today."   Prior Functioning  Home Living Lives With: Alone Available Help at Discharge: Family;Personal care attendant;Available 24 hours/day Type of Home: House Home Access: Level entry Home Layout: One level Bathroom Shower/Tub: Naval architect Equipment: Shower chair without back;Walker - rolling;Straight cane Prior Function Level of Independence: Needs assistance Needs Assistance: Bathing;Toileting;Meal Prep;Light Housekeeping Able to Take Stairs?: No Driving: No Vocation: Retired Comments: Pt ambulated with RW prior to admission. Communication Communication: No difficulties    Cognition  Overall Cognitive Status: Impaired Area of Impairment: Following commands;Attention Arousal/Alertness: Awake/alert Orientation Level: Appears intact for tasks assessed Behavior During Session: [facility] for tasks performed Current Attention Level: Selective Following Commands: Follows one step commands with increased time    Extremity/Trunk Assessment Right Upper Extremity Assessment RUE ROM/Strength/Tone: Within functional levels RUE Sensation: WFL - Light Touch Left Upper Extremity Assessment LUE ROM/Strength/Tone: Deficits LUE ROM/Strength/Tone Deficits: LUE grossly 4/5 LUE Sensation: WFL - Light Touch Right Lower Extremity Assessment RLE ROM/Strength/Tone: Within functional levels Left Lower Extremity Assessment LLE ROM/Strength/Tone: Within functional levels Trunk Assessment Trunk Assessment: Normal   Balance Balance Balance Assessed: Yes Dynamic Sitting Balance Dynamic Sitting - Balance Support: During functional activity Dynamic Sitting - Level of Assistance: 4: Min assist;5: Stand by assistance Dynamic Sitting - Comments: Pt sitting EOB during MMT. Pt with occasional lateral lean to left that pt was  able to correct with verbal cueing.  End of Session PT - End of Session Equipment Utilized During Treatment: Gait belt Activity Tolerance: Patient tolerated treatment well;Patient limited by fatigue Patient left: in chair;with call bell/phone within reach Nurse Communication: Other (comment) (need to use toilet)  GP     Abdirahim Flavell 01/25/2012, 9:39 AM

## 2012-01-25 NOTE — Evaluation (Signed)
I have read and agree with the below assessment and plan.   Charolett Yarrow Helen Whitlow PT, DPT Pager: [phone] 

## 2012-01-25 NOTE — Progress Notes (Signed)
INITIAL ADULT NUTRITION ASSESSMENT Date: 01/25/2012   Time: 9:49 AM Reason for Assessment: MST score of 5 for unsure weight loss and eating poorly  INTERVENTION: Strawberry Ensure Complete po BID, each supplement provides 350 kcal and 13 grams of protein.   ASSESSMENT: Female 78 y.o.  Dx: TIA (transient ischemic attack)  Hx:  Past Medical History  Diagnosis Date  . Hypertension   . Osteopenia   . Atrial fibrillation     not coumadin candidate  . Impaired gait   . Gait abnormality     imbalance  . Urinary incontinence   . Diarrhea     chronic  . Barrett's esophagus   . Osteoarthritis   . Edema leg     chronic, LEFT leg, u/s 2009 neg for DVT   Past Surgical History  Procedure Date  . Cholecystectomy     had pancreatitis  . Hysterectomy-unknown    Related Meds:     . aspirin  325 mg Oral Daily  . calcium carbonate  1 tablet Oral Daily  . digoxin  125 mcg Oral Daily  . diltiazem  90 mg Oral TID  . enoxaparin  40 mg Subcutaneous Q24H  . ferrous sulfate  325 mg Oral Q breakfast  . metoprolol tartrate  50 mg Oral QPM  . DISCONTD: sodium chloride   Intravenous STAT  . DISCONTD: calcium carbonate  600 mg Oral Daily   Ht: 5\' 2"  (157.5 cm)  Wt: 116 lb 3.2 oz (52.708 kg)  Ideal Wt: 50 kg % Ideal Wt: 105%  Usual Wt: 130 lbs Wt Readings from Last 10 Encounters:  01/24/12 116 lb 3.2 oz (52.708 kg)  11/24/11 128 lb 4 oz (58.174 kg)  11/17/11 125 lb (56.7 kg)  11/06/11 127 lb 12.8 oz (57.97 kg)  10/14/11 131 lb 9.8 oz (59.7 kg)  07/18/11 135 lb (61.236 kg)  04/14/11 128 lb (58.06 kg)  01/05/11 128 lb 12.8 oz (58.423 kg)  12/26/10 128 lb 0.6 oz (58.079 kg)  11/04/10 125 lb (56.7 kg)   % Usual Wt: 89%  Body mass index is 21.25 kg/(m^2). WNL  Food/Nutrition Related Hx: Pt reports that her weight loss started 1 year ago and that she has not had any recent weight loss in the last month. Pt and family report that she has been eating less during that time, usually 2  meals per day but she is also drinking strawberry ensure. Pt has been eating about 50% of her meals here in the hospital.   Labs:  CMP     Component Value Date/Time   NA 139 01/25/2012 0748   K 5.2* 01/25/2012 0748   CL 99 01/25/2012 0748   CO2 34* 01/25/2012 0748   GLUCOSE 100* 01/25/2012 0748   BUN 30* 01/25/2012 0748   CREATININE 1.28* 01/25/2012 0748   CREATININE 1.09 12/26/2010 1142   CALCIUM 9.6 01/25/2012 0748   PROT 7.9 01/24/2012 1052   ALBUMIN 3.7 01/24/2012 1052   AST 29 01/24/2012 1052   ALT 28 01/24/2012 1052   ALKPHOS 56 01/24/2012 1052   BILITOT 0.4 01/24/2012 1052   GFRNONAA 34* 01/25/2012 0748   GFRAA 40* 01/25/2012 0748   Lipid Panel     Component Value Date/Time   CHOL 230* 01/25/2012 0748   TRIG 235* 01/25/2012 0748   HDL 55 01/25/2012 0748   CHOLHDL 4.2 01/25/2012 0748   VLDL 47* 01/25/2012 0748   LDLCALC 128* 01/25/2012 0748   Lab Results  Component Value Date  HGBA1C 6.4* 01/24/2012   CBG (last 3)   Basename 01/25/12 0730 01/24/12 2225 01/24/12 1651  GLUCAP 92 130* 98  No intake or output data in the 24 hours ending 01/25/12 0953  Diet Order: CHO Modified Medium  Supplements/Tube Feeding: none  IVF:    sodium chloride Last Rate: 75 mL/hr at 01/25/12 0016   Pt awaiting MRI to rule out new stroke, per MD probably a TIA. Pt for SNF placement at discharge.   Estimated Nutritional Needs:   Kcal:  1250-1400 Protein:  60-70 grams Fluid:  >1.5L/day  NUTRITION DIAGNOSIS: -Inadequate oral intake (NI-2.1).  Status: Ongoing  RELATED TO: decreased appetite  AS EVIDENCE BY: 11% weight loss over the past year  MONITORING/EVALUATION(Goals): Goal: Pt to meet >/= 90% of their estimated nutrition needs. Monitor: PO intake, weight   EDUCATION NEEDS: -No education needs identified at this time   DOCUMENTATION CODES Per approved criteria  -Not Applicable   Kendell Bane RD, LDN, CNSC [phone] Pager [phone] After Hours Pager  01/25/2012, 9:49 AM

## 2012-01-25 NOTE — Progress Notes (Signed)
Stroke Team Progress Note  HISTORY Sylvia Duncan is an 78 y.o. female Who requires help with assisted daily living , and ambulates with a walker. Was fine this morning, after she woke up she noticed some left sided weakness.  Code stroke was called in. She has chronic LE weakness and she has diagnosis of gait imbalance. She denied headaches, no dizziness, no nausea no vomiting, no chest pain.  Reviewed medications. She is not on coumadin, Monitor displaying Afib.  SUBJECTIVE Overall she feels her condition is rapidly improving. She does not have any residual weakness on left side at present. CT head and carotid Dopplers negative so far. Waiting for MRI and 2-D echo results. Patient denies any worsening of her symptoms. Does not have any nausea vomiting, abdominal pain, chest pain, short of breath.  OBJECTIVE Most recent Vital Signs: Filed Vitals:   01/25/12 0230 01/25/12 0650 01/25/12 1003 01/25/12 1045  BP: 120/68 155/83  143/81  Pulse: 76 58 70 72  Temp: 98.6 F (37 C) 97.6 F (36.4 C)  98.6 F (37 C)  TempSrc: Oral Oral  Oral  Resp: 20 20  18   Height:      Weight:      SpO2: 96% 97%  97%   CBG (last 3)   Basename 01/25/12 1126 01/25/12 0730 01/24/12 2225  GLUCAP 102* 92 130*   Intake/Output from previous day:    IV Fluid Intake:     . sodium chloride 75 mL/hr at 01/25/12 0016    MEDICATIONS    . aspirin  325 mg Oral Daily  . atorvastatin  10 mg Oral q1800  . calcium carbonate  1 tablet Oral Daily  . digoxin  125 mcg Oral Daily  . diltiazem  90 mg Oral TID  . enoxaparin  40 mg Subcutaneous Q24H  . ferrous sulfate  325 mg Oral Q breakfast  . metoprolol tartrate  50 mg Oral QPM  . sodium polystyrene  30 g Oral Once  . DISCONTD: sodium chloride   Intravenous STAT  . DISCONTD: calcium carbonate  600 mg Oral Daily   PRN:  acetaminophen  Diet:  Carb Control Activity:  Bedrest with Bathroom privileges. DVT Prophylaxis:  Lovenox  CLINICALLY SIGNIFICANT  STUDIES Basic Metabolic Panel:  Lab 01/25/12 8119 01/24/12 1517 01/24/12 1058 01/24/12 1052  NA 139 -- 141 --  K 5.2* -- 4.3 --  CL 99 -- 101 --  CO2 34* -- -- 31  GLUCOSE 100* -- 174* --  BUN 30* -- 34* --  CREATININE 1.28* 1.06 -- --  CALCIUM 9.6 -- -- 10.0  MG -- -- -- --  PHOS -- -- -- --   Liver Function Tests:  Lab 01/24/12 1052  AST 29  ALT 28  ALKPHOS 56  BILITOT 0.4  PROT 7.9  ALBUMIN 3.7   CBC:  Lab 01/25/12 0748 01/24/12 1517 01/24/12 1052  WBC 10.6* 11.4* --  NEUTROABS -- -- 7.5  HGB 16.1* 15.1* --  HCT 48.4* 45.0 --  MCV 96.6 95.5 --  PLT 224 231 --   Coagulation:  Lab 01/24/12 1052  LABPROT 13.8  INR 1.04   Cardiac Enzymes:  Lab 01/24/12 1053  CKTOTAL 31  CKMB 2.2  CKMBINDEX --  TROPONINI <0.30   Urinalysis:  Lab 01/24/12 1356  COLORURINE YELLOW  LABSPEC 1.008  PHURINE 6.5  GLUCOSEU NEGATIVE  HGBUR NEGATIVE  BILIRUBINUR NEGATIVE  KETONESUR NEGATIVE  PROTEINUR NEGATIVE  UROBILINOGEN 0.2  NITRITE NEGATIVE  LEUKOCYTESUR NEGATIVE  Lipid Panel    Component Value Date/Time   CHOL 230* 01/25/2012 0748   TRIG 235* 01/25/2012 0748   HDL 55 01/25/2012 0748   CHOLHDL 4.2 01/25/2012 0748   VLDL 47* 01/25/2012 0748   LDLCALC 128* 01/25/2012 0748   HgbA1C  Lab Results  Component Value Date   HGBA1C 6.4* 01/24/2012    Alcohol Level: No results found for this basename: ETH:2 in the last 168 hours  Ct Head Wo Contrast  01/24/2012  *RADIOLOGY REPORT*  Clinical Data: Left-sided weakness.  Code stroke.  CT HEAD WITHOUT CONTRAST  Technique:  Contiguous axial images were obtained from the base of the skull through the vertex without contrast.  Comparison: Head CT 04/04/2011  Findings: No acute intracranial hemorrhage.  No focal mass lesion. No CT evidence of acute infarction.   No midline shift or mass effect.  No hydrocephalus.  Basilar cisterns are patent.  There is generalized cortical atrophy and ventricular dilatation which is unchanged from prior.   Paranasal sinuses and mastoid air cells are clear.  Orbits are normal.  IMPRESSION:  1.  No CT evidence of infarction or intracranial hemorrhage. 2.  Atrophy and microvascular disease similar to prior.  Findings conveyed to Dr. Nino Parsley 01/19/2012 at 1115 hours.   Original Report Authenticated By: Genevive Bi, M.D.     CT of the brain  No CT evidence of infarction or intracranial hemorrhage.  MRI of the brain    MRA of the brain not needed.  2D Echocardiogram  results pending  Carotid Doppler No significant extracranial carotid artery stenosis demonstrated. Vertebrals are patent with antegrade flow.  CXR    EKG  A. Fib, left axis deviation.  Therapy Recommendations PT - ; OT - ; ST -   Physical Exam  Patient alert and oriented x 3. Speech clear. No aphasia. No dysarthria. Extraoccular movements intact. Visual fields full. Face symmetric. Tongue midline. Moves all extremities x 4. Strength normal. Coordination normal. Sensation intact. Heart rate regular. Breath sounds clear.   ASSESSMENT Sylvia Duncan is a 78 y.o. female presenting with transient left-sided weakness. Imaging did not show any infarct. The probably a TIA.  Work up underway. On aspirin 325 mg orally every day prior to admission. Now on clopidogrel 75 mg orally every day for secondary stroke prevention. Patient with resultant improvement in symptoms.  - Hypertension - Gait imbalance - Chronic A. Fib - DM 2   Hospital day # 1  TREATMENT/PLAN - Stop aspirin Add clopidogrel 75 mg orally every day for secondary stroke prevention. - Will get MRI had rule out new stroke. - Will get 2-D echo results. - Agree with no anticoagulation per primary team.  Diya Gervasi MD 12:58 PM   Scribe for Dr. Delia Heady, Stroke Center Medical Director, who has personally reviewed chart, pertinent data, examined the patient and developed the plan of care.  Pager: [phone]

## 2012-01-25 NOTE — Evaluation (Signed)
Occupational Therapy Evaluation Patient Details Name: Sylvia Duncan MRN: [id number] DOB: 1913/01/02 Today's Date: 01/25/2012 Time: 8295-6213 OT Time Calculation (min): 32 min  OT Assessment / Plan / Recommendation Clinical Impression  This 78 y.o. female admitted with lt. sided weakness.  Pt. is moving more slowly than normal, and she feels that she is generally weaker.  Bil. UE strengh is 5/5 and symmetrical.   Pt. will benefit from OT to maximize safety and independence with BADLs to allow pt. to return to supervision level. Dtr reports they are no longer able to provide 24 hour care at home, and  wish to pursue SNF vs. ALF    OT Assessment  Patient needs continued OT Services    Follow Up Recommendations  Skilled nursing facility;Supervision/Assistance - 24 hour    Barriers to Discharge Decreased caregiver support    Equipment Recommendations  Defer to next venue    Recommendations for Other Services    Frequency  Min 2X/week    Precautions / Restrictions Precautions Precautions: Fall Restrictions Weight Bearing Restrictions: No       ADL  Eating/Feeding: Simulated;Set up Where Assessed - Eating/Feeding: Edge of bed Grooming: Performed;Wash/dry hands;Min guard Where Assessed - Grooming: Supported standing Upper Body Bathing: Simulated;Minimal assistance Where Assessed - Upper Body Bathing: Unsupported sitting;Supported sitting Lower Body Bathing: Simulated;Minimal assistance Where Assessed - Lower Body Bathing: Supported sit to stand Upper Body Dressing: Simulated;Minimal assistance Where Assessed - Upper Body Dressing: Unsupported sitting Lower Body Dressing: Simulated;Performed;Minimal assistance Where Assessed - Lower Body Dressing: Supported sit to Pharmacist, hospital: Performed;Minimal assistance Toilet Transfer Method: Sit to Barista: Comfort height toilet;Grab bars Toileting - Architect and Hygiene: Performed;Moderate  assistance Where Assessed - Toileting Clothing Manipulation and Hygiene: Standing Equipment Used: Rolling walker Transfers/Ambulation Related to ADLs: Pt ambulated with min guard assis to BR.  Requires min A for sit to stand from toilet ADL Comments: Pt. able to don/doff socks while EOB with supervision.  Pt. required 3 short rest breaks during eval due to dyspnea.  Pt. moves slowly    OT Diagnosis: Generalized weakness  OT Problem List: Decreased strength;Decreased activity tolerance;Impaired balance (sitting and/or standing) OT Treatment Interventions: Self-care/ADL training;Therapeutic activities;Patient/family education   OT Goals Acute Rehab OT Goals OT Goal Formulation: With patient Time For Goal Achievement: 02/01/12 Potential to Achieve Goals: Good ADL Goals Pt Will Perform Grooming: with supervision;Standing at sink ADL Goal: Grooming - Progress: Goal set today Pt Will Perform Upper Body Bathing: with set-up;Sitting, edge of bed;Sitting, chair ADL Goal: Upper Body Bathing - Progress: Goal set today Pt Will Perform Lower Body Bathing: with supervision;Sit to stand from bed;Sit to stand from chair ADL Goal: Lower Body Bathing - Progress: Goal set today Pt Will Perform Lower Body Dressing: with set-up;Sitting, chair;Sitting, bed ADL Goal: Lower Body Dressing - Progress: Goal set today Pt Will Transfer to Toilet: with supervision;Ambulation;Comfort height toilet;3-in-1 ADL Goal: Toilet Transfer - Progress: Goal set today Pt Will Perform Toileting - Clothing Manipulation: with supervision;Standing ADL Goal: Toileting - Clothing Manipulation - Progress: Goal set today Pt Will Perform Toileting - Hygiene: with modified independence;Sit to stand from 3-in-1/toilet ADL Goal: Toileting - Hygiene - Progress: Goal set today  Visit Information  Last OT Received On: 01/25/12 Assistance Needed: +1    Subjective Data  Subjective: "I feel so weak" Patient Stated Goal: To get stronger     Prior Functioning  Vision/Perception  Home Living Lives With: Alone Available Help at Discharge: Skilled Nursing  Facility (Dtr has let caregivers "go" - plan is for SNF vs. ALF) Type of Home: House Home Access: Level entry Home Layout: One level Bathroom Shower/Tub: Health visitor: Standard Bathroom Accessibility: Yes How Accessible: Accessible via walker Home Adaptive Equipment: Shower chair without back;Walker - rolling;Straight cane;Bedside commode/3-in-1 Prior Function Level of Independence: Needs assistance Needs Assistance: Meal Prep;Light Housekeeping Able to Take Stairs?: No Driving: No Vocation: Retired Musician: No difficulties Dominant Hand: Right   Vision - Assessment Vision Assessment: Vision not tested Additional Comments: Pt denies changes.  No obvious deficits noted.  Will continue to asses through function Perception Perception: Within Functional Limits Praxis Praxis: Intact  Cognition  Overall Cognitive Status: Impaired Area of Impairment: Attention Arousal/Alertness: Awake/alert Orientation Level: Appears intact for tasks assessed Behavior During Session: [facility] for tasks performed Current Attention Level: Selective Following Commands: Follows multi-step commands consistently Cognition - Other Comments: Pt. slow to process information    Extremity/Trunk Assessment Right Upper Extremity Assessment RUE ROM/Strength/Tone: Within functional levels RUE Sensation: WFL - Light Touch RUE Coordination: WFL - gross/fine motor Left Upper Extremity Assessment LUE ROM/Strength/Tone: Within functional levels LUE ROM/Strength/Tone Deficits: Lt. UE 5/5 and equal to Rt. during OT eval.  Pt. is slightly slower to move Lt. UE LUE Sensation: WFL - Light Touch LUE Coordination: WFL - gross/fine motor Trunk Assessment Trunk Assessment: Normal   Mobility  Shoulder Instructions  Bed Mobility Bed Mobility: Supine to Sit;Sitting -  Scoot to Edge of Bed;Sit to Supine Supine to Sit: 6: Modified independent (Device/Increase time);With rails;HOB flat Sitting - Scoot to Edge of Bed: 6: Modified independent (Device/Increase time) Sit to Supine: 6: Modified independent (Device/Increase time) Transfers Transfers: Sit to Stand;Stand to Sit Sit to Stand: 4: Min assist;With upper extremity assist;From bed;From toilet Stand to Sit: 4: Min assist       Exercise     Balance     End of Session OT - End of Session Activity Tolerance: Patient limited by fatigue Patient left: in bed;with call bell/phone within reach;with family/visitor present;with bed alarm set  GO     Joshua Soulier M 01/25/2012, 1:45 PM

## 2012-01-26 DIAGNOSIS — I639 Cerebral infarction, unspecified: Secondary | ICD-10-CM

## 2012-01-26 HISTORY — DX: Cerebral infarction, unspecified: I63.9

## 2012-01-26 LAB — CBC
MCH: 32 pg (ref 26.0–34.0)
MCHC: 33 g/dL (ref 30.0–36.0)
MCV: 97 fL (ref 78.0–100.0)
Platelets: 204 10*3/uL (ref 150–400)

## 2012-01-26 LAB — GLUCOSE, CAPILLARY: Glucose-Capillary: 112 mg/dL — ABNORMAL HIGH (ref 70–99)

## 2012-01-26 LAB — BASIC METABOLIC PANEL
CO2: 27 mEq/L (ref 19–32)
Calcium: 9 mg/dL (ref 8.4–10.5)
Creatinine, Ser: 0.96 mg/dL (ref 0.50–1.10)
GFR calc non Af Amer: 48 mL/min — ABNORMAL LOW (ref >90)
Glucose, Bld: 108 mg/dL — ABNORMAL HIGH (ref 70–99)
Sodium: 140 mEq/L (ref 135–145)

## 2012-01-26 MED ORDER — ASPIRIN 81 MG PO CHEW
81.0000 mg | CHEWABLE_TABLET | Freq: Every day | ORAL | Status: DC
  Administered 2012-01-26 – 2012-01-29 (×4): 81 mg via ORAL
  Filled 2012-01-26 (×4): qty 1

## 2012-01-26 NOTE — Progress Notes (Signed)
Stroke Team Progress Note  HISTORY Sylvia Duncan is an 77 y.o. female Who requires help with assisted daily living , and ambulates with a walker. Was fine this morning, after she woke up she noticed some left sided weakness.  Code stroke was called in. She has chronic LE weakness and she has diagnosis of gait imbalance. She denied headaches, no dizziness, no nausea no vomiting, no chest pain.  Reviewed medications. She is not on coumadin, Monitor displaying Afib.  SUBJECTIVE Overall she feels her condition is rapidly improving. She does c/o some weakness on left side at present. MRI shows acute R parietal and insular infarct. Patient denies any worsening of her symptoms. Does not have any nausea vomiting, abdominal pain, chest pain, short of breath.  OBJECTIVE Most recent Vital Signs: Filed Vitals:   01/25/12 1744 01/25/12 2115 01/26/12 0118 01/26/12 0630  BP: 124/82 135/79 117/77 148/86  Pulse: 86 90 83 80  Temp: 97.3 F (36.3 C) 98.3 F (36.8 C) 98.2 F (36.8 C) 98.3 F (36.8 C)  TempSrc: Oral Oral Oral Oral  Resp: 18 16 18 18   Height:      Weight:      SpO2: 98% 96% 100% 100%   CBG (last 3)   Basename 01/25/12 2149 01/25/12 1658 01/25/12 1126  GLUCAP 135* 167* 102*   Intake/Output from previous day: 08/29 0701 - 08/30 0700 In: 1425 [P.O.:600; I.V.:825] Out: 300 [Urine:300]  IV Fluid Intake:      . sodium chloride 75 mL/hr at 01/26/12 1610    MEDICATIONS     . atorvastatin  10 mg Oral q1800  . calcium carbonate  1 tablet Oral Daily  . clopidogrel  75 mg Oral Q breakfast  . digoxin  125 mcg Oral Daily  . diltiazem  90 mg Oral TID  . enoxaparin  30 mg Subcutaneous Q24H  . feeding supplement  237 mL Oral BID BM  . ferrous sulfate  325 mg Oral Q breakfast  . metoprolol tartrate  50 mg Oral QPM  . sodium polystyrene  30 g Oral Once  . DISCONTD: aspirin  325 mg Oral Daily  . DISCONTD: enoxaparin  40 mg Subcutaneous Q24H   PRN:  acetaminophen  Diet:  Carb  Control Activity:  Bedrest with Bathroom privileges. DVT Prophylaxis:  Lovenox  CLINICALLY SIGNIFICANT STUDIES Basic Metabolic Panel:   Lab 01/26/12 0605 01/25/12 1549 01/25/12 0748  NA 140 -- 139  K 4.8 4.5 --  CL 104 -- 99  CO2 27 -- 34*  GLUCOSE 108* -- 100*  BUN 24* -- 30*  CREATININE 0.96 -- 1.28*  CALCIUM 9.0 -- 9.6  MG -- -- --  PHOS -- -- --   Liver Function Tests:   Lab 01/24/12 1052  AST 29  ALT 28  ALKPHOS 56  BILITOT 0.4  PROT 7.9  ALBUMIN 3.7   CBC:   Lab 01/26/12 0605 01/25/12 0748 01/24/12 1052  WBC 11.4* 10.6* --  NEUTROABS -- -- 7.5  HGB 14.0 16.1* --  HCT 42.4 48.4* --  MCV 97.0 96.6 --  PLT 204 224 --   Coagulation:   Lab 01/24/12 1052  LABPROT 13.8  INR 1.04   Cardiac Enzymes:   Lab 01/24/12 1053  CKTOTAL 31  CKMB 2.2  CKMBINDEX --  TROPONINI <0.30   Urinalysis:   Lab 01/24/12 1356  COLORURINE YELLOW  LABSPEC 1.008  PHURINE 6.5  GLUCOSEU NEGATIVE  HGBUR NEGATIVE  BILIRUBINUR NEGATIVE  KETONESUR NEGATIVE  PROTEINUR NEGATIVE  UROBILINOGEN 0.2  NITRITE NEGATIVE  LEUKOCYTESUR NEGATIVE   Lipid Panel    Component Value Date/Time   CHOL 230* 01/25/2012 0748   TRIG 235* 01/25/2012 0748   HDL 55 01/25/2012 0748   CHOLHDL 4.2 01/25/2012 0748   VLDL 47* 01/25/2012 0748   LDLCALC 128* 01/25/2012 0748   HgbA1C  Lab Results  Component Value Date   HGBA1C 6.4* 01/24/2012    Alcohol Level: No results found for this basename: ETH:2 in the last 168 hours  Ct Head Wo Contrast  01/24/2012  *RADIOLOGY REPORT*  Clinical Data: Left-sided weakness.  Code stroke.  CT HEAD WITHOUT CONTRAST  Technique:  Contiguous axial images were obtained from the base of the skull through the vertex without contrast.  Comparison: Head CT 04/04/2011  Findings: No acute intracranial hemorrhage.  No focal mass lesion. No CT evidence of acute infarction.   No midline shift or mass effect.  No hydrocephalus.  Basilar cisterns are patent.  There is generalized  cortical atrophy and ventricular dilatation which is unchanged from prior.  Paranasal sinuses and mastoid air cells are clear.  Orbits are normal.  IMPRESSION:  1.  No CT evidence of infarction or intracranial hemorrhage. 2.  Atrophy and microvascular disease similar to prior.  Findings conveyed to Dr. Nino Parsley 01/19/2012 at 1115 hours.   Original Report Authenticated By: Genevive Bi, M.D.    Mri Brain Without Contrast  01/25/2012  *RADIOLOGY REPORT*  Clinical Data: Left-sided weakness.  CVA.  MRI HEAD WITHOUT CONTRAST  Technique:  Multiplanar, multiecho pulse sequences of the brain and surrounding structures were obtained according to standard protocol without intravenous contrast.  Comparison: CT 01/24/2012  Findings: Small area of  acute infarct in the right insular cortex. Small area of acute infarct in the right parietal white matter and cortex.  These are both in the right middle cerebral artery territory.  No other acute infarct.  Moderately advanced generalized cerebral atrophy.  Mild chronic microvascular ischemia in the white matter.  Negative for hemorrhage or mass lesion.  Paranasal sinuses are clear.  IMPRESSION: Small areas of acute infarct involving the right insula, and right parietal white matter and cortex.  Moderate atrophy with mild chronic microvascular ischemia.   Original Report Authenticated By: Camelia Phenes, M.D.     CT of the brain  No CT evidence of infarction or intracranial hemorrhage.  MRI of the brain  Small areas of acute infarct involving the right insula, and right parietal white matter and cortex. R MCA territory. Moderate atrophy with mild chronic microvascular ischemia.  MRA of the brain not needed.  2D Echocardiogram  EF 45-50%. Severely dilated LA, Mod-severe dilated RA. PA pressure-41.  Carotid Doppler No significant extracranial carotid artery stenosis demonstrated. Vertebrals are patent with antegrade flow.  CXR    EKG  A. Fib, left axis  deviation.  Therapy Recommendations PT -  ; OT - SNF; ST -   Physical Exam  Patient alert and oriented x 3. Speech clear. No aphasia. No dysarthria. Extraoccular movements intact. Visual fields full. Face symmetric. Tongue midline. Moves all extremities x 4. Strength normal. Coordination normal. Sensation intact. Heart rate regular. Breath sounds clear.   ASSESSMENT Sylvia Duncan is a 78 y.o. female presenting with transient left-sided weakness. Imaging did show new insular and parietal  infarct.  On aspirin 325 mg orally every day prior to admission. Now on clopidogrel 75 mg orally every day for secondary stroke prevention. Patient with resultant improvement in symptoms-  but still feels weaker on left side and moves slowly than normal with OT.  - Hypertension - Gait imbalance - Chronic A. Fib - DM 2   Hospital day # 2  TREATMENT/PLAN - Start aspirin 81 mg daily and continue clopidogrel 75 mg orally every day for secondary stroke prevention. - MRI confirms new stroke as above Right insular and parietal. - 2-D echo shows no thrombus- EF- 45-50% but has Severely dilated LA, Mod-severe dilated RA - worsening compared to 2009. - CVA most likely from A-fib- pt not on anti-coagulation (not a good candidate). No further workup needed at this time. - SNF placement per OT. - Stroke team will sign off. Please call for further questions if needed.   Zanai Mallari MD 8:01 AM   Scribe for Dr. Delia Heady, Stroke Center Medical Director, who has personally reviewed chart, pertinent data, examined the patient and developed the plan of care.  Pager: [phone]

## 2012-01-26 NOTE — Telephone Encounter (Signed)
Had a chest x-ray 10/25/2011, no acute changes.  If she must have a chest x-ray now, then arrange chest x-ray, dx screening for TB, screening infex diseases

## 2012-01-26 NOTE — Progress Notes (Signed)
Patient ID: Sylvia Duncan  female  WJX:[id number]    DOB: 15-Sep-1912    DOA: 01/24/2012  PCP: Willow Ora, MD  Subjective: Patient seen and examined earlier this a.m., left-sided weakness improved, appears to be at her baseline. Denies any headache, nausea, vomiting, abdominal pain,CP, shortness of breath. Daughter at the bedside.  Objective: Weight change:   Intake/Output Summary (Last 24 hours) at 01/26/12 1527 Last data filed at 01/26/12 1151  Gross per 24 hour  Intake   1665 ml  Output   1150 ml  Net    515 ml   Blood pressure 111/65, pulse 80, temperature 98.9 F (37.2 C), temperature source Oral, resp. rate 18, height 5\' 2"  (1.575 m), weight 52.708 kg (116 lb 3.2 oz), SpO2 98.00%.  Physical Exam: General: Alert and awake, oriented x3, not in any acute distress. HEENT: anicteric sclera, pupils reactive to light and accommodation, EOMI CVS: S1-S2 clear, no murmur rubs or gallops Chest: clear to auscultation bilaterally, no wheezing, rales or rhonchi Abdomen: soft nontender, nondistended, normal bowel sounds, no organomegaly Extremities: no cyanosis, clubbing or edema noted bilaterally  Lab Results: Basic Metabolic Panel:  Lab 01/26/12 2130 01/25/12 1549 01/25/12 0748  NA 140 -- 139  K 4.8 4.5 --  CL 104 -- 99  CO2 27 -- 34*  GLUCOSE 108* -- 100*  BUN 24* -- 30*  CREATININE 0.96 -- 1.28*  CALCIUM 9.0 -- 9.6  MG -- -- --  PHOS -- -- --   Liver Function Tests:  Lab 01/24/12 1052  AST 29  ALT 28  ALKPHOS 56  BILITOT 0.4  PROT 7.9  ALBUMIN 3.7   CBC:  Lab 01/26/12 0605 01/25/12 0748 01/24/12 1052  WBC 11.4* 10.6* --  NEUTROABS -- -- 7.5  HGB 14.0 16.1* --  HCT 42.4 48.4* --  MCV 97.0 96.6 --  PLT 204 224 --   Cardiac Enzymes:  Lab 01/24/12 1053  CKTOTAL 31  CKMB 2.2  CKMBINDEX --  TROPONINI <0.30   BNP: No components found with this basename: POCBNP:2 CBG:  Lab 01/26/12 1152 01/25/12 2149 01/25/12 1658 01/25/12 1126 01/25/12 0730  GLUCAP 112*  135* 167* 102* 92     Micro Results: No results found for this or any previous visit (from the past 240 hour(s)).  Studies/Results: Ct Head Wo Contrast  01/24/2012  *RADIOLOGY REPORT*  Clinical Data: Left-sided weakness.  Code stroke.  CT HEAD WITHOUT CONTRAST  Technique:  Contiguous axial images were obtained from the base of the skull through the vertex without contrast.  Comparison: Head CT 04/04/2011  Findings: No acute intracranial hemorrhage.  No focal mass lesion. No CT evidence of acute infarction.   No midline shift or mass effect.  No hydrocephalus.  Basilar cisterns are patent.  There is generalized cortical atrophy and ventricular dilatation which is unchanged from prior.  Paranasal sinuses and mastoid air cells are clear.  Orbits are normal.  IMPRESSION:  1.  No CT evidence of infarction or intracranial hemorrhage. 2.  Atrophy and microvascular disease similar to prior.  Findings conveyed to Dr. Nino Parsley 01/19/2012 at 1115 hours.   Original Report Authenticated By: Genevive Bi, M.D.     Medications: Scheduled Meds:    . aspirin  81 mg Oral Daily  . atorvastatin  10 mg Oral q1800  . calcium carbonate  1 tablet Oral Daily  . clopidogrel  75 mg Oral Q breakfast  . digoxin  125 mcg Oral Daily  . diltiazem  90 mg  Oral TID  . enoxaparin  30 mg Subcutaneous Q24H  . feeding supplement  237 mL Oral BID BM  . ferrous sulfate  325 mg Oral Q breakfast  . metoprolol tartrate  50 mg Oral QPM   Continuous Infusions:    . sodium chloride 75 mL/hr at 01/26/12 7846   Carotid Dopplers: Preliminary report: Bilateral: No evidence of hemodynamically significant internal carotid artery stenosis. Vertebral artery flow is antegrade.    Assessment/Plan: Principal Problem: Acute CVA: MRI confirmed acute infarct involving the right insula, right parietal white matter and cortex, right MCA territory - Dopplers with no evidence of significant internal carotid artery stenosis.  - 2-D  echocardiogram shows EF of 45-50%, severely dilated LA, moderately-severe dilated RA - Discussed with neurology/stroke service in detail, recommended to continue aspirin and Plavix given her chronic A. fib - Started statins for hyperlipidemia - PT recommending skilled nursing facility  Active Problems:  HYPERTENSION: Stable for now  Hyperlipidemia: Cholesterol 2:30, triglycerides 235, LDL 128 - Continue Lipitor    Chronic a-fib: Rate controlled, no repeat sinus pauses  - Not a Coumadin candidate, continue aspirin and Plavix   Diabetes mellitus type II: diet controlled - Hemoglobin A1c 6.4   Hyperkalemia: Improved   DVT Prophylaxis:lovenox  Code Status:DNR  Disposition: Discussed with social worker, likely DC on Monday. Patient and daughter interested in [facility] skilled nursing facility and will not accept patient on the weekend.   LOS: 2 days   Leveon Pelzer M.D. [facility] 01/26/2012, 3:27 PM Pager: [phone]  If 7PM-7AM, please contact night-coverage [email or web address] Password [email or web address]

## 2012-01-26 NOTE — Progress Notes (Signed)
Physical Therapy Treatment Patient Details Name: Sylvia Duncan MRN: [id number] DOB: May 31, 1912 Today's Date: 01/26/2012 Time: 1217-1226 PT Time Calculation (min): 9 min  PT Assessment / Plan / Recommendation Comments on Treatment Session  Pt self limited gait distance due to lunch arriving during PT session. She walked 11' with RW, expect she could walk farther. SNF recommended.    Follow Up Recommendations  Skilled nursing facility;Supervision for mobility/OOB (ST-SNF)    Barriers to Discharge        Equipment Recommendations  None recommended by PT    Recommendations for Other Services    Frequency Min 3X/week   Plan Discharge plan remains appropriate;Frequency remains appropriate    Precautions / Restrictions Precautions Precautions: Fall Restrictions Weight Bearing Restrictions: No   Pertinent Vitals/Pain **pt denied pain*    Mobility  Bed Mobility Bed Mobility: Supine to Sit;Sitting - Scoot to Edge of Bed;Sit to Supine Supine to Sit: 6: Modified independent (Device/Increase time);With rails;HOB elevated Sitting - Scoot to Edge of Bed: 6: Modified independent (Device/Increase time);With rail Transfers Sit to Stand: With upper extremity assist;From bed;4: Min guard Stand to Sit: 4: Min guard;With armrests;With upper extremity assist Details for Transfer Assistance: VCs for hand placement, min/guard for safety/balance Ambulation/Gait Ambulation/Gait Assistance: 4: Min guard Ambulation Distance (Feet): 12 Feet Assistive device: Rolling walker Ambulation/Gait Assistance Details: distance limited due to pt wanting to eat lunch Gait Pattern: Decreased step length - right;Decreased step length - left;Step-to pattern    Exercises     PT Diagnosis:    PT Problem List:   PT Treatment Interventions:     PT Goals Acute Rehab PT Goals PT Goal Formulation: With patient Time For Goal Achievement: 02/08/12 Potential to Achieve Goals: Good Pt will Roll Supine to Right  Side: with modified independence Pt will Roll Supine to Left Side: with modified independence Pt will Sit at Edge of Bed: with supervision;3-5 min;with no upper extremity support Pt will go Sit to Stand: with supervision PT Goal: Sit to Stand - Progress: Progressing toward goal Pt will go Stand to Sit: with supervision PT Goal: Stand to Sit - Progress: Progressing toward goal Pt will Transfer Bed to Chair/Chair to Bed: with supervision PT Transfer Goal: Bed to Chair/Chair to Bed - Progress: Progressing toward goal Pt will Ambulate: 51 - 150 feet;with supervision;with least restrictive assistive device PT Goal: Ambulate - Progress: Progressing toward goal  Visit Information  Last PT Received On: 01/26/12 Assistance Needed: +1    Subjective Data  Subjective: Why do you want to walk during lunch time? Patient Stated Goal: none stated   Cognition  Overall Cognitive Status: Appears within functional limits for tasks assessed/performed Arousal/Alertness: Awake/alert Orientation Level: Appears intact for tasks assessed Behavior During Session: [facility] for tasks performed Following Commands: Follows multi-step commands consistently    Balance     End of Session PT - End of Session Activity Tolerance: Patient tolerated treatment well;Patient limited by fatigue Patient left: in chair;with call bell/phone within reach;with family/visitor present Nurse Communication: Mobility status   GP     Ralene Bathe Kistler 01/26/2012, 12:32 PM [phone]

## 2012-01-26 NOTE — Clinical Social Work Placement (Addendum)
Clinical Social Work Department CLINICAL SOCIAL WORK PLACEMENT NOTE 01/26/2012  Patient:  ANAYAH, ARVANITIS  Account Number:  [id number] Admit date:  01/24/2012  Clinical Social Worker:  Ashley Jacobs, [place]  Date/time:  01/26/2012 02:30 PM  Clinical Social Work is seeking post-discharge placement for this patient at the following level of care:   SKILLED NURSING   (*CSW will update this form in Epic as items are completed)   01/26/2012  Patient/family provided with [facility] Department of Clinical Social Work's list of facilities offering this level of care within the geographic area requested by the patient (or if unable, by the patient's family).  01/26/2012  Patient/family informed of their freedom to choose among providers that offer the needed level of care, that participate in Medicare, Medicaid or managed care program needed by the patient, have an available bed and are willing to accept the patient.  01/26/2012  Patient/family informed of [facility]' ownership interest in [facility], as well as of the fact that they are under no obligation to receive care at this facility.  PASARR submitted to EDS on 01/26/2012 PASARR number received from EDS on 01/26/2012  FL2 transmitted to all facilities in geographic area requested by pt/family on  01/26/2012 FL2 transmitted to all facilities within larger geographic area on   Patient informed that his/her managed care company has contracts with or will negotiate with  certain facilities, including the following:     Patient/family informed of bed offers received:01/26/2012 (JB)   Patient chooses bed at [facility] Physician recommends and patient chooses bed at    Patient to be transferred to [facility] on  01/29/12 Patient to be transferred to facility by dtr  The following physician request were entered in Epic:   Additional Comments:  Ashley Jacobs, MSW LCSW [phone]

## 2012-01-26 NOTE — Clinical Social Work Psychosocial (Signed)
Clinical Social Work Department BRIEF PSYCHOSOCIAL ASSESSMENT 01/26/2012  Patient:  Sylvia Duncan, Sylvia Duncan     Account Number:  [id number]     Admit date:  01/24/2012  Clinical Social Worker:  Andres Shad  Date/Time:  01/26/2012 02:20 PM  Referred by:  Physician  Date Referred:  01/26/2012 Referred for  SNF Placement   Other Referral:   Interview type:  Family Other interview type:   Patient sleeping and does not have her hearing aides in thus could not understand.  Patient daughter in room, completed assessment    PSYCHOSOCIAL DATA Living Status:  ALONE Admitted from facility:   Level of care:   Primary support name:  Daughter Primary support relationship to patient:  FAMILY Degree of support available:   Very supportive, but due to age and personal medical need, needs more assistance than family can give at this time    CURRENT CONCERNS Current Concerns  Post-Acute Placement   Other Concerns:   none    SOCIAL WORK ASSESSMENT / PLAN Met with family and patient at the bedside. Explained role and consult referral. Aware of CSW and reports awaiting visit. Patient is sleeping and per daughter cannot hear well with out her hearing aides, thus daughter completed assessment and answered questions.    Patient lives alone and daughter reports was interested in ALF, however at the present time and due to medical issues, patient will need SNF.  Aware of [facility] and request facility. Permission given to fax out patient's information in efforts to receive bed offers for SNF.  W    Will have weekend to follow up with bed offers.  Anticipate DC early next week due to holiday and need of three day qualifying stay.   Assessment/plan status:  Psychosocial Support/Ongoing Assessment of Needs Other assessment/ plan:   referral for SNF  Follow up with bed offers   Information/referral to community resources:   Bed offers in [facility]    PATIENT'S/FAMILY'S  RESPONSE TO PLAN OF CARE: daughter very cooperative and pleasant.  Agreeable to SNF and requesting [facility].        Ashley Jacobs, MSW LCSW [phone]

## 2012-01-27 DIAGNOSIS — I6350 Cerebral infarction due to unspecified occlusion or stenosis of unspecified cerebral artery: Principal | ICD-10-CM

## 2012-01-27 LAB — GLUCOSE, CAPILLARY
Glucose-Capillary: 96 mg/dL (ref 70–99)
Glucose-Capillary: 130 mg/dL — ABNORMAL HIGH (ref 70–99)
Glucose-Capillary: 116 mg/dL — ABNORMAL HIGH (ref 70–99)
Glucose-Capillary: 120 mg/dL — ABNORMAL HIGH (ref 70–99)

## 2012-01-27 LAB — CBC
HCT: 41.1 % (ref 36.0–46.0)
Hemoglobin: 13.6 g/dL (ref 12.0–15.0)
RBC: 4.26 MIL/uL (ref 3.87–5.11)
WBC: 11 10*3/uL — ABNORMAL HIGH (ref 4.0–10.5)

## 2012-01-27 LAB — BASIC METABOLIC PANEL
BUN: 26 mg/dL — ABNORMAL HIGH (ref 6–23)
CO2: 31 mEq/L (ref 19–32)
Chloride: 106 mEq/L (ref 96–112)
Glucose, Bld: 94 mg/dL (ref 70–99)
Potassium: 4.3 mEq/L (ref 3.5–5.1)
Sodium: 143 mEq/L (ref 135–145)

## 2012-01-27 NOTE — Progress Notes (Signed)
Patient having periods of extreme bradycardia(30's).  Patient is asymptomatic.  Paged Elray Mcgregor with [facility] who instructed me to keep monitoring the patient.  No orders given.

## 2012-01-27 NOTE — Progress Notes (Signed)
Patient ID: Sylvia Duncan  female  ZOX:[id number]    DOB: 09/24/1912    DOA: 01/24/2012  PCP: Willow Ora, MD  Subjective: Patient seen and examined, no complaints, eating breakfast. Denies any headache, nausea, vomiting, abdominal pain,CP, shortness of breath.   Objective: Weight change:   Intake/Output Summary (Last 24 hours) at 01/27/12 1002 Last data filed at 01/26/12 1151  Gross per 24 hour  Intake    240 ml  Output      0 ml  Net    240 ml   Blood pressure 134/77, pulse 109, temperature 97.8 F (36.6 C), temperature source Oral, resp. rate 16, height 5\' 2"  (1.575 m), weight 52.708 kg (116 lb 3.2 oz), SpO2 98.00%.  Physical Exam: General: Alert and awake, oriented x3, not in any acute distress. HEENT: anicteric sclera, pupils reactive to light and accommodation, EOMI CVS: S1-S2 clear, no murmur rubs or gallops Chest: clear to auscultation bilaterally, no wheezing, rales or rhonchi Abdomen: soft nontender, nondistended, normal bowel sounds, no organomegaly Extremities: no cyanosis, clubbing or edema noted bilaterally  Lab Results: Basic Metabolic Panel:  Lab 01/27/12 8119 01/26/12 0605  NA 143 140  K 4.3 4.8  CL 106 104  CO2 31 27  GLUCOSE 94 108*  BUN 26* 24*  CREATININE 0.97 0.96  CALCIUM 9.1 9.0  MG -- --  PHOS -- --   Liver Function Tests:  Lab 01/24/12 1052  AST 29  ALT 28  ALKPHOS 56  BILITOT 0.4  PROT 7.9  ALBUMIN 3.7   CBC:  Lab 01/27/12 0620 01/26/12 0605 01/24/12 1052  WBC 11.0* 11.4* --  NEUTROABS -- -- 7.5  HGB 13.6 14.0 --  HCT 41.1 42.4 --  MCV 96.5 97.0 --  PLT 188 204 --   Cardiac Enzymes:  Lab 01/24/12 1053  CKTOTAL 31  CKMB 2.2  CKMBINDEX --  TROPONINI <0.30   BNP: No components found with this basename: POCBNP:2 CBG:  Lab 01/27/12 0657 01/26/12 2317 01/26/12 1643 01/26/12 1152 01/26/12 0630  GLUCAP 96 113* 107* 112* 112*     Micro Results: No results found for this or any previous visit (from the past 240  hour(s)).  Studies/Results: Ct Head Wo Contrast  01/24/2012  *RADIOLOGY REPORT*  Clinical Data: Left-sided weakness.  Code stroke.  CT HEAD WITHOUT CONTRAST  Technique:  Contiguous axial images were obtained from the base of the skull through the vertex without contrast.  Comparison: Head CT 04/04/2011  Findings: No acute intracranial hemorrhage.  No focal mass lesion. No CT evidence of acute infarction.   No midline shift or mass effect.  No hydrocephalus.  Basilar cisterns are patent.  There is generalized cortical atrophy and ventricular dilatation which is unchanged from prior.  Paranasal sinuses and mastoid air cells are clear.  Orbits are normal.  IMPRESSION:  1.  No CT evidence of infarction or intracranial hemorrhage. 2.  Atrophy and microvascular disease similar to prior.  Findings conveyed to Dr. Nino Parsley 01/19/2012 at 1115 hours.   Original Report Authenticated By: Genevive Bi, M.D.     Medications: Scheduled Meds:    . aspirin  81 mg Oral Daily  . atorvastatin  10 mg Oral q1800  . calcium carbonate  1 tablet Oral Daily  . clopidogrel  75 mg Oral Q breakfast  . digoxin  125 mcg Oral Daily  . diltiazem  90 mg Oral TID  . enoxaparin  30 mg Subcutaneous Q24H  . feeding supplement  237 mL Oral BID  BM  . ferrous sulfate  325 mg Oral Q breakfast  . metoprolol tartrate  50 mg Oral QPM   Continuous Infusions:    . sodium chloride 75 mL/hr at 01/26/12 1191   Carotid Dopplers: Preliminary report: Bilateral: No evidence of hemodynamically significant internal carotid artery stenosis. Vertebral artery flow is antegrade.    Assessment/Plan: Principal Problem: Acute CVA: MRI confirmed acute infarct involving the right insula, right parietal white matter and cortex, right MCA territory - Dopplers with no evidence of significant internal carotid artery stenosis.  - 2-D echocardiogram shows EF of 45-50%, severely dilated LA, moderately-severe dilated RA - continue aspirin and Plavix  given her chronic A. Fib, statins for hyperlipidemia - PT recommending skilled nursing facility  Active Problems:  HYPERTENSION: Stable for now  Hyperlipidemia: Cholesterol 2:30, triglycerides 235, LDL 128 - Continue Lipitor    Chronic a-fib: Rate controlled, no repeat sinus pauses  - Not a Coumadin candidate, continue aspirin and Plavix   Diabetes mellitus type II: diet controlled - Hemoglobin A1c 6.4   Hyperkalemia: Improved   DVT Prophylaxis:lovenox  Code Status:DNR  Disposition: awaiting SNF, DC on Monday. Patient and daughter interested in [facility] skilled nursing facility and will not accept patient on the weekend, SW aware.   LOS: 3 days   Dayleen Beske M.D. [facility] 01/27/2012, 10:02 AM Pager: [phone]  If 7PM-7AM, please contact night-coverage [email or web address] Password [email or web address]

## 2012-01-28 DIAGNOSIS — D509 Iron deficiency anemia, unspecified: Secondary | ICD-10-CM

## 2012-01-28 DIAGNOSIS — I498 Other specified cardiac arrhythmias: Secondary | ICD-10-CM

## 2012-01-28 DIAGNOSIS — R001 Bradycardia, unspecified: Secondary | ICD-10-CM | POA: Diagnosis present

## 2012-01-28 LAB — GLUCOSE, CAPILLARY
Glucose-Capillary: 143 mg/dL — ABNORMAL HIGH (ref 70–99)
Glucose-Capillary: 153 mg/dL — ABNORMAL HIGH (ref 70–99)

## 2012-01-28 NOTE — Progress Notes (Signed)
Patient ID: Sylvia Duncan  female  ZOX:[id number]    DOB: 1912-10-19    DOA: 01/24/2012  PCP: Willow Ora, MD  Subjective: Patient seen and examined, per RN,  Pt had severe bradycardia at night with HR in 30's. Currently no CP, SOB, fever, chills, nausea, vomiting.  Objective: Weight change:   Intake/Output Summary (Last 24 hours) at 01/28/12 1246 Last data filed at 01/28/12 1235  Gross per 24 hour  Intake    160 ml  Output      0 ml  Net    160 ml   Blood pressure 122/71, pulse 104, temperature 97.8 F (36.6 C), temperature source Oral, resp. rate 16, height 5\' 2"  (1.575 m), weight 52.708 kg (116 lb 3.2 oz), SpO2 99.00%.  Physical Exam: General: Alert and awake, oriented x3, not in any acute distress. HEENT: anicteric sclera, pupils reactive to light and accommodation, EOMI CVS: S1-S2 clear, no murmur rubs or gallops Chest: clear to auscultation bilaterally, no wheezing, rales or rhonchi Abdomen: soft nontender, nondistended, normal bowel sounds, no organomegaly Extremities: no cyanosis, clubbing or edema noted bilaterally  Lab Results: Basic Metabolic Panel:  Lab 01/27/12 8119 01/26/12 0605  NA 143 140  K 4.3 4.8  CL 106 104  CO2 31 27  GLUCOSE 94 108*  BUN 26* 24*  CREATININE 0.97 0.96  CALCIUM 9.1 9.0  MG -- --  PHOS -- --   Liver Function Tests:  Lab 01/24/12 1052  AST 29  ALT 28  ALKPHOS 56  BILITOT 0.4  PROT 7.9  ALBUMIN 3.7   CBC:  Lab 01/27/12 0620 01/26/12 0605 01/24/12 1052  WBC 11.0* 11.4* --  NEUTROABS -- -- 7.5  HGB 13.6 14.0 --  HCT 41.1 42.4 --  MCV 96.5 97.0 --  PLT 188 204 --   Cardiac Enzymes:  Lab 01/24/12 1053  CKTOTAL 31  CKMB 2.2  CKMBINDEX --  TROPONINI <0.30   BNP: No components found with this basename: POCBNP:2 CBG:  Lab 01/28/12 1126 01/28/12 0651 01/27/12 2144 01/27/12 1655 01/27/12 1145  GLUCAP 126* 89 120* 116* 130*     Micro Results: No results found for this or any previous visit (from the past 240  hour(s)).  Studies/Results: Ct Head Wo Contrast  01/24/2012  *RADIOLOGY REPORT*  Clinical Data: Left-sided weakness.  Code stroke.  CT HEAD WITHOUT CONTRAST  Technique:  Contiguous axial images were obtained from the base of the skull through the vertex without contrast.  Comparison: Head CT 04/04/2011  Findings: No acute intracranial hemorrhage.  No focal mass lesion. No CT evidence of acute infarction.   No midline shift or mass effect.  No hydrocephalus.  Basilar cisterns are patent.  There is generalized cortical atrophy and ventricular dilatation which is unchanged from prior.  Paranasal sinuses and mastoid air cells are clear.  Orbits are normal.  IMPRESSION:  1.  No CT evidence of infarction or intracranial hemorrhage. 2.  Atrophy and microvascular disease similar to prior.  Findings conveyed to Dr. Nino Parsley 01/19/2012 at 1115 hours.   Original Report Authenticated By: Genevive Bi, M.D.     Medications: Scheduled Meds:    . aspirin  81 mg Oral Daily  . atorvastatin  10 mg Oral q1800  . calcium carbonate  1 tablet Oral Daily  . clopidogrel  75 mg Oral Q breakfast  . diltiazem  90 mg Oral TID  . enoxaparin  30 mg Subcutaneous Q24H  . feeding supplement  237 mL Oral BID BM  .  ferrous sulfate  325 mg Oral Q breakfast  . DISCONTD: digoxin  125 mcg Oral Daily  . DISCONTD: metoprolol tartrate  50 mg Oral QPM   Continuous Infusions:    . DISCONTD: sodium chloride 75 mL/hr at 01/26/12 1610   Carotid Dopplers: Preliminary report: Bilateral: No evidence of hemodynamically significant internal carotid artery stenosis. Vertebral artery flow is antegrade.    Assessment/Plan: Principal Problem: Acute CVA: MRI confirmed acute infarct involving the right insula, right parietal white matter and cortex, right MCA territory - Dopplers with no evidence of significant internal carotid artery stenosis.  - 2-D echocardiogram shows EF of 45-50%, severely dilated LA, moderately-severe dilated  RA - continue aspirin and Plavix given her chronic A. Fib, statins for hyperlipidemia - PT recommending skilled nursing facility  Bradycardia: - Hold Digoxin, betablocker.  - EKG today  showed RBBB (old). Will monitor closely, may have to place back on BB if HR start trending up.   Active Problems:  HYPERTENSION: Stable for now  Hyperlipidemia: Cholesterol 2:30, triglycerides 235, LDL 128 - Continue Lipitor    Chronic a-fib: with bradycardia  - Not a Coumadin candidate, continue aspirin and Plavix - Holding BB and digoxin, may have to restart BB if HR starts trending up.    Diabetes mellitus type II: diet controlled - Hemoglobin A1c 6.4   Hyperkalemia: Improved   DVT Prophylaxis:lovenox  Code Status:DNR  Disposition: awaiting SNF, DC on Monday. Patient and daughter interested in [facility] skilled nursing facility and will not accept patient on the weekend, SW aware.   LOS: 4 days   RAI,RIPUDEEP M.D. [facility] 01/28/2012, 12:46 PM Pager: [phone]  If 7PM-7AM, please contact night-coverage [email or web address] Password [email or web address]

## 2012-01-28 NOTE — Progress Notes (Signed)
CSW provided b/o to pt's dtr, Corrie Dandy, via phone--also left offers in pt's room. Pt's dtr reports hopeful for [facility], however they have not responded yet. Weekday CSW to f/u with [facility] re: ability to extend b/o. Pt's dtr aware SNF will need to be chosen from available offers if [facility] unable to offer. Anticipate d/c Monday.  Dellie Burns, MSW, LCSWA [phone] (Weekends 8:00am-4:30pm)

## 2012-01-29 LAB — GLUCOSE, CAPILLARY

## 2012-01-29 MED ORDER — ATORVASTATIN CALCIUM 10 MG PO TABS: 10.0000 mg | ORAL_TABLET | Freq: Every day | ORAL | Status: DC

## 2012-01-29 MED ORDER — DILTIAZEM HCL 60 MG PO TABS: 60.0000 mg | ORAL_TABLET | Freq: Three times a day (TID) | ORAL | Status: DC

## 2012-01-29 MED ORDER — CLOPIDOGREL BISULFATE 75 MG PO TABS: 75.0000 mg | ORAL_TABLET | Freq: Every day | ORAL | Status: AC

## 2012-01-29 MED ORDER — ATORVASTATIN CALCIUM 10 MG PO TABS: 10.0000 mg | ORAL_TABLET | Freq: Every day | ORAL | Status: AC

## 2012-01-29 MED ORDER — ASPIRIN 81 MG PO CHEW: 81.0000 mg | CHEWABLE_TABLET | Freq: Every day | ORAL | Status: AC

## 2012-01-29 MED ORDER — ENSURE COMPLETE PO LIQD: 237.0000 mL | Freq: Two times a day (BID) | ORAL | Status: DC

## 2012-01-29 MED ORDER — DILTIAZEM HCL 60 MG PO TABS
60.0000 mg | ORAL_TABLET | Freq: Three times a day (TID) | ORAL | Status: DC
  Administered 2012-01-29: 60 mg via ORAL
  Filled 2012-01-29 (×4): qty 1

## 2012-01-29 MED ORDER — ASPIRIN 81 MG PO CHEW: 81.0000 mg | CHEWABLE_TABLET | Freq: Every day | ORAL | Status: DC

## 2012-01-29 MED ORDER — CLOPIDOGREL BISULFATE 75 MG PO TABS: 75.0000 mg | ORAL_TABLET | Freq: Every day | ORAL | Status: DC

## 2012-01-29 NOTE — Progress Notes (Signed)
Clinical Social Work  Per Charity fundraiser, patient is ready to [facility]. CSW called dtr Corrie Dandy) to inform her that [facility] is not accepting patients today. CSW encouraged dtr to choose alternative SNF. Mary asked CSW to call patient's dtr Truett Perna) regarding decision. CSW called dtr and left a message with CSW contact information. CSW will continue to follow to assist with dc needs.  [place], [place] [phone] (Coverage for Dede Query)

## 2012-01-29 NOTE — Progress Notes (Signed)
Clinical Social Work  CSW faxed dc summary to SNF [facility]) and spoke with Ilene who reported that patient could admit today. CSW prepared dc packet and provided family with information. CSW informed patient, dtr and RN of dc and all were agreeable. Dtr will transport patient to SNF. CSW is signing off.  [place], [place] [phone]

## 2012-01-29 NOTE — Progress Notes (Signed)
Clinical Social Work  CSW spoke with [facility] who reported that patient can admit today and use medications from home. SNF asked that CSW speak with dtr to determine which medications are new and which medications SNF will need to get. Once DC summary is complete, CSW will review with dtr. CSW text paged MD this information.  [place], [place] [phone]

## 2012-01-29 NOTE — Progress Notes (Signed)
Patient is having multiple episodes of bradycardia with heart rate in low 30's while asleep. When awake pt's HR is in 60's. Blood pressure WNL, pt is asymptomatic. Sylvia Duncan with [facility] paged and is advising this RN to keep monitoring pt closely. No new orders given. Pt is resting comfortably and has no complaints. Will continue to monitor.

## 2012-01-29 NOTE — Progress Notes (Signed)
Clinical Social Work  CSW spoke with dtr Truett Perna) and explained dc. Dtr reports she will not accept any other SNF options and will only admit patient to [facility]. CSW previously spoke with [facility] who reported no admissions today due to holiday. Dtr reports that she spoke with [facility] last week who was agreeable to admission. Dtr reports that she will take patient home for the night and will then admit patient to [facility] tomorrow. CSW and dtr discussed patient's safety and if dtr would be able to properly care for patient. Dtr reported that she felt she had adequate support to care for patient. CSW encouraged dtr to have alternative plan in case [facility] was unable to accept patient tomorrow. Dtr reports that she would consider other options if [facility] was unable. CSW spoke with dtr regarding HH with CSW to assist with placement. Dtr reports that she will talk with her sister and call CSW back in regards to [facility]. CSW will continue to follow.  [place], [place] [phone]

## 2012-01-29 NOTE — Discharge Summary (Signed)
Physician Discharge Summary  Patient ID: Sylvia Duncan MRN: [id number] DOB/AGE: 1912-09-28 78 y.o.  Admit date: 01/24/2012 Discharge date: 01/29/2012  Primary Care Physician:  Willow Ora, MD  Discharge Diagnoses:    .HYPERTENSION .Barrett's esophagus .GAIT IMBALANCE .DIARRHEA, CHRONIC .Chronic a-fib .Diabetes mellitus type II . acute CVA (cerebral infarction) .Bradycardia  Consults:  Neurology                   Cardiology: Dr Daleen Squibb (Phone consultation)  Discharge Medications: Medication List  As of 01/29/2012  9:44 AM   STOP taking these medications         aspirin 500 MG EC tablet      digoxin 0.125 MG tablet      furosemide 20 MG tablet      metoprolol tartrate 25 MG tablet      potassium chloride 10 MEQ tablet      traMADol 50 MG tablet         TAKE these medications         acetaminophen 500 MG tablet   Commonly known as: TYLENOL   Take 1,000 mg by mouth every 6 (six) hours as needed. For pain      aspirin 81 MG chewable tablet   Chew 1 tablet (81 mg total) by mouth daily.      atorvastatin 10 MG tablet   Commonly known as: LIPITOR   Take 1 tablet (10 mg total) by mouth daily at 6 PM.      calcium carbonate 600 MG Tabs   Commonly known as: OS-CAL   Take 600 mg by mouth daily.      clopidogrel 75 MG tablet   Commonly known as: PLAVIX   Take 1 tablet (75 mg total) by mouth daily with breakfast.      diltiazem 60 MG tablet   Commonly known as: CARDIZEM   Take 1 tablet (60 mg total) by mouth 3 (three) times daily.      feeding supplement Liqd   Take 237 mLs by mouth 2 (two) times daily between meals.      ferrous sulfate 325 (65 FE) MG tablet   Take 325 mg by mouth daily with breakfast.      Fiber 625 MG Tabs   Take by mouth daily.             Brief H and P: For complete details please refer to admission H and P, but in brief Patient is a 78 year old female with history of hypertension, osteopenia, atrial fibrillation, not on Coumadin who  lives at home with the caregivers presented to ED with left-sided weakness today. History was obtained from the patient's daughter who does not live with her but was called by the caregiver today. Per daughter's report, patient was in her normal baseline of health till 10 AM after she had breakfast, she dropped her glasses and became weak in her left side/upper extremity. She reports that the caregiver also stated that she had some slurred speech which had improved in ED.  Otherwise patient denied any nausea, vomiting, chest pain, syncope or any recent illness.   Hospital Course:  Patient is a 78 year old female with a history of hypertension, osteopenia, mitral fibrillation not on Coumadin who presented with left-sided weakness and transient slurred speech. Patient was admitted initially for TIA however MRI confirmed that patient had acute CVA. Acute CVA: MRI confirmed acute infarct involving the right insula, right parietal white matter and cortex, right MCA territory. Full stroke workup  was done. Carotid Dopplers showed no evidence of significant internal carotid artery stenosis. 2-D echocardiogram showed EF of 45-50%, severely dilated LA, moderately-severe dilated RA. Neurology was consulted and patient was followed by stroke service, recommended continuing aspirin and Plavix given her chronic A. Fib possibly causing the acute CVA. Patient was started on statins for hyperlipidemia. Physical therapy evaluation recommended skilled nursing Saturday   Bradycardia: During hospitalization patient was noted to have bradycardia episodes with lowest heart rate 38 mostly during sleeping with sinus pauses, otherwise heart rate in 50s to 60s during the day. Digoxin and beta blocker were discontinued. EKG showed right bundle branch block (old). I discussed in detail with Dr. Valera Castle from [facility] cardiology on the phone. Per Dr. Daleen Squibb, also recommended to decrease the Cardizem to 60 mg TID. Given her age and  comorbidities she is not a candidate for pacemaker if this time She should followup with her primary cardiologist Dr. Antoine Poche.   HYPERTENSION: Remained stable    Hyperlipidemia: Cholesterol 230, triglycerides 235, LDL 128, patient was placed on Lipitor   Chronic a-fib: with bradycardia  - Not a Coumadin candidate, continue aspirin and Plavix per neurology/stroke service when patient's  Diabetes mellitus type II: diet controlled,  Hemoglobin A1c 6.4    Day of Discharge BP 117/63  Pulse 55  Temp 97.5 F (36.4 C) (Oral)  Resp 16  Ht 5\' 2"  (1.575 m)  Wt 52.708 kg (116 lb 3.2 oz)  BMI 21.25 kg/m2  SpO2 100%  Physical Exam: General: Alert and awake oriented x3 not in any acute distress. HEENT: anicteric sclera, pupils reactive to light and accommodation CVS: S1-S2 clear no murmur rubs or gallops Chest: clear to auscultation bilaterally, no wheezing rales or rhonchi Abdomen: soft nontender, nondistended, normal bowel sounds, no organomegaly Extremities: no cyanosis, clubbing or edema noted bilaterally Neuro: Cranial nerves II-XII intact, no focal neurological deficits   The results of significant diagnostics from this hospitalization (including imaging, microbiology, ancillary and laboratory) are listed below for reference.    LAB RESULTS: Basic Metabolic Panel:  Lab 01/27/12 4540 01/26/12 0605  NA 143 140  K 4.3 4.8  CL 106 104  CO2 31 27  GLUCOSE 94 108*  BUN 26* 24*  CREATININE 0.97 0.96  CALCIUM 9.1 9.0  MG -- --  PHOS -- --   Liver Function Tests:  Lab 01/24/12 1052  AST 29  ALT 28  ALKPHOS 56  BILITOT 0.4  PROT 7.9  ALBUMIN 3.7   CBC:  Lab 01/27/12 0620 01/26/12 0605 01/24/12 1052  WBC 11.0* 11.4* --  NEUTROABS -- -- 7.5  HGB 13.6 14.0 --  HCT 41.1 42.4 --  MCV 96.5 -- --  PLT 188 204 --   Cardiac Enzymes:  Lab 01/24/12 1053  CKTOTAL 31  CKMB 2.2  CKMBINDEX --  TROPONINI <0.30   BNP: No components found with this basename:  POCBNP:2 CBG:  Lab 01/29/12 0703 01/28/12 2128  GLUCAP 104* 153*    Significant Diagnostic Studies:  CT of the brain No CT evidence of infarction or intracranial hemorrhage.  MRI of the brain Small areas of acute infarct involving the right insula, and right parietal white matter and cortex. R MCA territory. Moderate atrophy with mild chronic microvascular ischemia.  MRA of the brain not needed.  2D Echocardiogram EF 45-50%. Severely dilated LA, Mod-severe dilated RA. PA pressure-41.  Carotid Doppler No significant extracranial carotid artery stenosis demonstrated. Vertebrals are patent with antegrade flow.  CXR  EKG A. Fib,  left axis deviation.  Therapy Recommendations PT - ; OT - SNF; ST -      Disposition and Follow-up: Discharge Orders    Future Appointments: Provider: Department: Dept Phone: Center:   05/20/2012 11:00 AM [name], MD [facility] [phone] [facility]     Future Orders Please Complete By Expires   Diet - low sodium heart healthy      Increase activity slowly          DISPOSITION: Skilled nursing facility for rehabilitation  DIET: Heart healthy diet  ACTIVITY: As tolerated   DISCHARGE FOLLOW-UP Follow-up Information    Follow up with Willow Ora, MD. Schedule an appointment as soon as possible for a visit in 10 days. (for hospital follow-up)    Contact information:   [address] [facility] [address] [place] [address] [phone]       Follow up with Rollene Rotunda, MD. Schedule an appointment as soon as possible for a visit in 2 weeks. (for hospital follow-up)    Contact information:   [address] [place] [address] [phone]       Follow up with Gates Rigg, MD. Schedule an appointment as soon as possible for a visit in 2 months. (for stroke follow-up)    Contact information:   [address], [address] [facility] [place] [address] [phone]          Time spent on Discharge: 45 mins  Signed:   RAI,RIPUDEEP M.D. [facility] 01/29/2012, 9:44 AM Pager: [phone]

## 2012-01-29 NOTE — Progress Notes (Signed)
Patient D/C instructions and stroke education provided to patient and family. Stroke Education Manual given. All questions answered to patient's satisfaction. Pt D/C with family and to be admitted to [facility] for further rehab. Pt D/C with no signs of acute distress.

## 2012-01-29 NOTE — Progress Notes (Signed)
CARE MANAGEMENT NOTE 01/29/2012  Patient:  Sylvia Duncan, Sylvia Duncan   Account Number:  [id number]  Date Initiated:  01/24/2012  Documentation initiated by:  [facility]  Subjective/Objective Assessment:   Admitted with TIA. Lives at home with caregivers.    Action/Plan:   PT/OT evals  Anticipated DC Date:  01/29/2012   Anticipated DC Plan:  SKILLED NURSING FACILITY  In-house referral Clinical Social Worker     DC Planning Services CM consult     Choice offered to / List presented to:             Status of service:  Completed, signed off Medicare Important Message given?   (If response is "NO", the following Medicare IM given date fields will be blank) Date Medicare IM given:   Date Additional Medicare IM given:    Discharge Disposition:  SKILLED NURSING FACILITY  Per UR Regulation:  Reviewed for med. necessity/level of care/duration of stay  If discussed at Long Length of Stay Meetings, dates discussed:    Comments:

## 2012-02-01 NOTE — Telephone Encounter (Signed)
FL2 has been faxed.  

## 2012-02-06 ENCOUNTER — Ambulatory Visit (INDEPENDENT_AMBULATORY_CARE_PROVIDER_SITE_OTHER): Payer: Medicare Other | Admitting: Internal Medicine

## 2012-02-06 ENCOUNTER — Encounter: Payer: Self-pay | Admitting: Internal Medicine

## 2012-02-06 VITALS — BP 118/82 | HR 87 | Temp 97.7°F | Wt 135.0 lb

## 2012-02-06 DIAGNOSIS — I4891 Unspecified atrial fibrillation: Secondary | ICD-10-CM

## 2012-02-06 DIAGNOSIS — E785 Hyperlipidemia, unspecified: Secondary | ICD-10-CM

## 2012-02-06 DIAGNOSIS — I639 Cerebral infarction, unspecified: Secondary | ICD-10-CM

## 2012-02-06 DIAGNOSIS — E119 Type 2 diabetes mellitus without complications: Secondary | ICD-10-CM

## 2012-02-06 DIAGNOSIS — Z111 Encounter for screening for respiratory tuberculosis: Secondary | ICD-10-CM

## 2012-02-06 DIAGNOSIS — I6350 Cerebral infarction due to unspecified occlusion or stenosis of unspecified cerebral artery: Secondary | ICD-10-CM

## 2012-02-06 MED ORDER — METOPROLOL TARTRATE 50 MG PO TABS: 25.0000 mg | ORAL_TABLET | Freq: Two times a day (BID) | ORAL | Status: DC

## 2012-02-06 NOTE — Assessment & Plan Note (Signed)
Started to Lipitor during the last admission, due for labs in one month, recommend to RTC in one month

## 2012-02-06 NOTE — Assessment & Plan Note (Addendum)
Recovering from a stroke 12-2011 No further symptoms consistent with a stroke, following PT OT at the nursing home and according to the daughter is helping quite a bit.  Continue with aspirin and Plavix, followup in neurology as recommended. Currently at a nursing home ([facility]), needs a TB test---> placed today, will come back in 2 days for reading.  Will also need a chest x-ray. X-ray ordered.

## 2012-02-06 NOTE — Patient Instructions (Addendum)
Come back 02/08/2012 to read the PPD. Please walk-in between 8 and 4:30, no appointment necessary. ------ Please get your x-ray at the other [place]  office located at: [address] [place], across from [facility].  Please go to the basement, this is a walk-in facility, they are open from 8:30 to 5:30 PM. Phone number [phone]. ------ start metoprolol 50 mg, only half tablet twice a day Get your BP and pulse check daily, fax [place] a log  in 10 days --------- Next office visit with [place] in one month, fasting if possible

## 2012-02-06 NOTE — Assessment & Plan Note (Signed)
History of atrial fibrillation  Developed bradycardia and the hospital, digoxin and metoprolol were discontinued, currently only on Cardizem.  She has developed palpitations at night that wake her up.  Plan:  Go back to a low dose of beta blockers.  Continue aspirin and Plavix; no coumadin candidate

## 2012-02-06 NOTE — Progress Notes (Signed)
Subjective:    Patient ID: Sylvia Duncan, female    DOB: 1912-09-18, 78 y.o.   MRN: [id number]  HPI Hospital followup, here w/ [facility] Admitted to the hospital from 01/24/2012 to 01/29/2012 due  to weakness, initial impression was TIA, MRI however show an acute CVA at the right insula, right periatrial white matter and cortex. Workup included the following --CT of the brain No CT evidence of infarction or intracranial hemorrhage.   --MRI of the brain Small areas of acute infarct involving the right insula, and right parietal white matter and cortex. R MCA territory. Moderate atrophy with mild chronic microvascular ischemia.   --MRA of the brain not needed.  --2D Echocardiogram EF 45-50%. Severely dilated LA, Mod-severe dilated RA. PA pressure-41.   --Carotid Doppler No significant extracranial carotid artery stenosis demonstrated. Vertebrals are patent with antegrade flow.   -- BMP okay, CBC normal except for a white count of 11, total cholesterol 230, LDL 128, A1c 6.4. --EKG A. Fib, left axis deviation.   Neurology recommended to continue aspirin and Plavix, patient was started on statins. He felt that atrial fibrillation was probably the culprit of the stroke PT and OT recommended SNF She also had bradycardia, digoxin and beta blockers were discontinued. Cardiology also recommended to decrease Cardizem. She was not felt to be a pacemaker candidate.  Past Medical History  Diagnosis Date  . Hypertension   . Osteopenia   . Atrial fibrillation     not coumadin candidate  . Impaired gait   . Gait abnormality     imbalance  . Urinary incontinence   . Diarrhea     chronic  . Barrett's esophagus   . Osteoarthritis   . Edema leg     chronic, LEFT leg, u/s 2009 neg for DVT  . CVA (cerebral infarction) 01/26/2012   Past Surgical History  Procedure Date  . Cholecystectomy     had pancreatitis  . Hysterectomy-unknown     Social History:   widow , lives by self , has someone with  her 24 h Currently at [facility] , nursing home after a stroke 12-2011  2 daughter, in [place]; Corrie Dandy and [place] older one is POA  [phone]) lost 2 children   Does not drive anymore Tobacco-- never   ETOH-- no   Review of Systems Since she left the hospital she is doing okay, she is slightly tired but in general following all her medications as prescribed, doing PT OT which help a great deal according to the patient's daughter. Appetite is good, is gaining weight, no edema. Denies any sore speech, focal deficits or facial numbness since he left the hospital No chest pain When asked, admits to having palpitations at night that wake her up, symptoms are self-limited. Not associated with chest pain. Emotionally, she is sad that she's not at home but symptoms are not severe, and denies the need of medication.    Objective:   Physical Exam General -- alert, well-developed, and not overweight appearing. No apparent distress, slightly weaker than in previous months, sits in a wheelchair, I didn't asked her to stand up.  Lungs -- normal respiratory effort, no intercostal retractions, no accessory muscle use, and normal breath sounds.   Heart-- irreg.   Extremities-- no pretibial edema bilaterally  Neurologic-- alert & oriented X3,  speech is clear, strength appropriate for age. Psych-- Cognition and judgment appear intact. Alert and cooperative with normal attention span and concentration.  not anxious appearing and not depressed appearing.  Assessment & Plan:

## 2012-02-06 NOTE — Assessment & Plan Note (Signed)
Last A1c 6.4. No change

## 2012-02-07 ENCOUNTER — Ambulatory Visit: Payer: Medicare Other | Admitting: Internal Medicine

## 2012-02-07 ENCOUNTER — Telehealth: Payer: Self-pay | Admitting: *Deleted

## 2012-02-07 NOTE — Telephone Encounter (Signed)
Okay, if by Monday we have not seen a report, let me know

## 2012-02-07 NOTE — Telephone Encounter (Signed)
Pt daughter called stating that Pt will not be back on Thursday to have TB skin test read. Pt daughter indicated that facility will read result and fax it over to [place] since it is so difficulty to get her in and out of our office. Pt daughter would like a call back if we do not received these results.

## 2012-02-07 NOTE — Telephone Encounter (Signed)
Please note

## 2012-02-08 ENCOUNTER — Ambulatory Visit: Payer: Medicare Other | Admitting: Cardiology

## 2012-02-15 NOTE — Telephone Encounter (Signed)
Not so far. 

## 2012-02-15 NOTE — Telephone Encounter (Signed)
Have you received the report?

## 2012-02-21 NOTE — Telephone Encounter (Signed)
LMOVM to return call.

## 2012-02-23 NOTE — Telephone Encounter (Signed)
Spoke with [facility] & she stated that she will let the facility know to fax over the report.

## 2012-03-01 ENCOUNTER — Ambulatory Visit: Payer: Medicare Other | Admitting: Cardiology

## 2012-03-26 DIAGNOSIS — I69959 Hemiplegia and hemiparesis following unspecified cerebrovascular disease affecting unspecified side: Secondary | ICD-10-CM

## 2012-03-26 DIAGNOSIS — Z5189 Encounter for other specified aftercare: Secondary | ICD-10-CM

## 2012-03-26 DIAGNOSIS — I4891 Unspecified atrial fibrillation: Secondary | ICD-10-CM

## 2012-03-26 DIAGNOSIS — M6281 Muscle weakness (generalized): Secondary | ICD-10-CM

## 2012-04-17 ENCOUNTER — Telehealth: Payer: Self-pay | Admitting: *Deleted

## 2012-04-17 NOTE — Telephone Encounter (Signed)
Received a fax from [facility] but was unable to read. Called [facility] & they stated the pt was complaining of headaches, runny nose, & cough. Please advise.

## 2012-04-18 NOTE — Telephone Encounter (Signed)
Faxed orders to [facility].

## 2012-04-18 NOTE — Telephone Encounter (Signed)
Rest, Tylenol, fluids, Robitussin-DM if needed. If severe symptoms, severe cough, high fever will need to be seen

## 2012-04-19 ENCOUNTER — Ambulatory Visit: Payer: Medicare Other | Admitting: Internal Medicine

## 2012-05-20 ENCOUNTER — Ambulatory Visit: Payer: Medicare Other | Admitting: Internal Medicine

## 2012-06-10 ENCOUNTER — Telehealth: Payer: Self-pay | Admitting: Internal Medicine

## 2012-06-10 MED ORDER — PREDNISONE 10 MG PO TABS: ORAL_TABLET | ORAL | Status: DC

## 2012-06-10 MED ORDER — HYDROCODONE-ACETAMINOPHEN 7.5-325 MG/15ML PO SOLN: 5.0000 mL | Freq: Four times a day (QID) | ORAL | Status: DC | PRN

## 2012-06-10 NOTE — Telephone Encounter (Signed)
Daughter made aware rx has been sent to pharmacy.

## 2012-06-10 NOTE — Telephone Encounter (Signed)
History of headaches before, felt to be possibly cluster headaches. Plan: Prednisone 10 mg: 3 tablets a day x 3 days, 2 tablets a day x 3 days, one tablet a day x 3 days , then stop. #18 no refills, please call in Hydrocodone, see prescription. Notify the daughter we are calling those  instructions to her assisted living facility

## 2012-06-10 NOTE — Telephone Encounter (Signed)
Corrie Dandy (daughter) is calling to report that the patient (her mother) has had 3 bad migraines in the past 2 weeks.  Pt is a resident of an assisted living facility and the only medication that she is giving is a Tylenol.  Pt has been seen by the office for migraines before  But it has been almost a year.  RN asked caller if patient could come in to be seen by the doctor but the daughter states they could not until next week.  OFFICE PLEASE FOLLOW UP WITH CALLER IF MEDICATION CAN BE CALLED IN FOR PATIENT TO TREAT HER MIGRAINE.  CALLER STATES MEDICATION CAN BE CALLED IN [facility] OFF [address] [phone]

## 2012-06-11 ENCOUNTER — Encounter (HOSPITAL_COMMUNITY): Payer: Self-pay | Admitting: *Deleted

## 2012-06-11 ENCOUNTER — Emergency Department (HOSPITAL_COMMUNITY): Payer: Medicare Other

## 2012-06-11 ENCOUNTER — Inpatient Hospital Stay (HOSPITAL_COMMUNITY)
Admission: EM | Admit: 2012-06-11 | Discharge: 2012-06-15 | DRG: 193 | Disposition: A | Discharge status: Home Health | Payer: Medicare Other | Attending: Internal Medicine | Admitting: Internal Medicine

## 2012-06-11 ENCOUNTER — Telehealth: Payer: Self-pay | Admitting: *Deleted

## 2012-06-11 DIAGNOSIS — I5033 Acute on chronic diastolic (congestive) heart failure: Secondary | ICD-10-CM | POA: Diagnosis present

## 2012-06-11 DIAGNOSIS — M1990 Unspecified osteoarthritis, unspecified site: Secondary | ICD-10-CM | POA: Diagnosis present

## 2012-06-11 DIAGNOSIS — I4891 Unspecified atrial fibrillation: Secondary | ICD-10-CM | POA: Diagnosis present

## 2012-06-11 DIAGNOSIS — R5381 Other malaise: Secondary | ICD-10-CM | POA: Diagnosis present

## 2012-06-11 DIAGNOSIS — I10 Essential (primary) hypertension: Secondary | ICD-10-CM

## 2012-06-11 DIAGNOSIS — Z8673 Personal history of transient ischemic attack (TIA), and cerebral infarction without residual deficits: Secondary | ICD-10-CM

## 2012-06-11 DIAGNOSIS — J189 Pneumonia, unspecified organism: Principal | ICD-10-CM

## 2012-06-11 DIAGNOSIS — R7989 Other specified abnormal findings of blood chemistry: Secondary | ICD-10-CM

## 2012-06-11 DIAGNOSIS — R05 Cough: Secondary | ICD-10-CM

## 2012-06-11 DIAGNOSIS — E876 Hypokalemia: Secondary | ICD-10-CM | POA: Diagnosis present

## 2012-06-11 DIAGNOSIS — N179 Acute kidney failure, unspecified: Secondary | ICD-10-CM | POA: Diagnosis present

## 2012-06-11 DIAGNOSIS — E119 Type 2 diabetes mellitus without complications: Secondary | ICD-10-CM

## 2012-06-11 DIAGNOSIS — Z7982 Long term (current) use of aspirin: Secondary | ICD-10-CM

## 2012-06-11 DIAGNOSIS — Z66 Do not resuscitate: Secondary | ICD-10-CM | POA: Diagnosis present

## 2012-06-11 DIAGNOSIS — I129 Hypertensive chronic kidney disease with stage 1 through stage 4 chronic kidney disease, or unspecified chronic kidney disease: Secondary | ICD-10-CM | POA: Diagnosis present

## 2012-06-11 DIAGNOSIS — I6350 Cerebral infarction due to unspecified occlusion or stenosis of unspecified cerebral artery: Secondary | ICD-10-CM

## 2012-06-11 DIAGNOSIS — N182 Chronic kidney disease, stage 2 (mild): Secondary | ICD-10-CM | POA: Diagnosis present

## 2012-06-11 DIAGNOSIS — I639 Cerebral infarction, unspecified: Secondary | ICD-10-CM

## 2012-06-11 DIAGNOSIS — K219 Gastro-esophageal reflux disease without esophagitis: Secondary | ICD-10-CM | POA: Diagnosis present

## 2012-06-11 DIAGNOSIS — R0602 Shortness of breath: Secondary | ICD-10-CM

## 2012-06-11 DIAGNOSIS — I509 Heart failure, unspecified: Secondary | ICD-10-CM | POA: Diagnosis present

## 2012-06-11 DIAGNOSIS — E785 Hyperlipidemia, unspecified: Secondary | ICD-10-CM | POA: Diagnosis present

## 2012-06-11 LAB — COMPREHENSIVE METABOLIC PANEL
ALT: 21 U/L (ref 0–35)
AST: 35 U/L (ref 0–37)
Alkaline Phosphatase: 52 U/L (ref 39–117)
CO2: 21 mEq/L (ref 19–32)
Calcium: 8.8 mg/dL (ref 8.4–10.5)
Chloride: 101 mEq/L (ref 96–112)
GFR calc non Af Amer: 45 mL/min — ABNORMAL LOW (ref >90)
Potassium: 3.3 mEq/L — ABNORMAL LOW (ref 3.5–5.1)
Sodium: 139 mEq/L (ref 135–145)
Total Bilirubin: 0.8 mg/dL (ref 0.3–1.2)

## 2012-06-11 LAB — COMPREHENSIVE METABOLIC PANEL WITH GFR
Albumin: 3.5 g/dL (ref 3.5–5.2)
BUN: 33 mg/dL — ABNORMAL HIGH (ref 6–23)
Creatinine, Ser: 1.01 mg/dL (ref 0.50–1.10)
GFR calc Af Amer: 52 mL/min — ABNORMAL LOW (ref >90)
Glucose, Bld: 129 mg/dL — ABNORMAL HIGH (ref 70–99)
Total Protein: 6.8 g/dL (ref 6.0–8.3)

## 2012-06-11 LAB — CBC WITH DIFFERENTIAL/PLATELET
Basophils Absolute: 0 K/uL (ref 0.0–0.1)
Basophils Relative: 0 % (ref 0–1)
Eosinophils Absolute: 0 K/uL (ref 0.0–0.7)
Eosinophils Relative: 0 % (ref 0–5)
HCT: 35.8 % — ABNORMAL LOW (ref 36.0–46.0)
Hemoglobin: 12 g/dL (ref 12.0–15.0)
Lymphocytes Relative: 12 % (ref 12–46)
Lymphs Abs: 0.8 K/uL (ref 0.7–4.0)
MCH: 32.4 pg (ref 26.0–34.0)
MCHC: 33.5 g/dL (ref 30.0–36.0)
MCV: 96.8 fL (ref 78.0–100.0)
Monocytes Absolute: 0.4 K/uL (ref 0.1–1.0)
Monocytes Relative: 6 % (ref 3–12)
Neutro Abs: 5.3 K/uL (ref 1.7–7.7)
Neutrophils Relative %: 82 % — ABNORMAL HIGH (ref 43–77)
Platelets: 175 10*3/uL (ref 150–400)
RBC: 3.7 MIL/uL — ABNORMAL LOW (ref 3.87–5.11)
RDW: 15.9 % — ABNORMAL HIGH (ref 11.5–15.5)
WBC: 6.5 10*3/uL (ref 4.0–10.5)

## 2012-06-11 LAB — CBC
MCV: 97.1 fL (ref 78.0–100.0)
Platelets: 170 10*3/uL (ref 150–400)
RDW: 15.9 % — ABNORMAL HIGH (ref 11.5–15.5)
WBC: 5.9 10*3/uL (ref 4.0–10.5)

## 2012-06-11 LAB — PRO B NATRIURETIC PEPTIDE: Pro B Natriuretic peptide (BNP): 9693 pg/mL — ABNORMAL HIGH (ref 0–450)

## 2012-06-11 LAB — CREATININE, SERUM
Creatinine, Ser: 0.95 mg/dL (ref 0.50–1.10)
GFR calc Af Amer: 56 mL/min — ABNORMAL LOW (ref >90)
GFR calc non Af Amer: 49 mL/min — ABNORMAL LOW (ref >90)

## 2012-06-11 LAB — POCT I-STAT TROPONIN I: Troponin i, poc: 0.05 ng/mL (ref 0.00–0.08)

## 2012-06-11 MED ORDER — SODIUM CHLORIDE 0.9 % IJ SOLN
3.0000 mL | INTRAMUSCULAR | Status: DC | PRN
  Administered 2012-06-12 – 2012-06-13 (×2): 3 mL via INTRAVENOUS

## 2012-06-11 MED ORDER — CALCIUM CARBONATE 1250 (500 CA) MG PO TABS
500.0000 mg | ORAL_TABLET | Freq: Every day | ORAL | Status: DC
  Administered 2012-06-12 – 2012-06-15 (×4): 500 mg via ORAL
  Filled 2012-06-11 (×5): qty 1

## 2012-06-11 MED ORDER — CALCIUM CARBONATE 600 MG PO TABS: 600.0000 mg | ORAL_TABLET | Freq: Every day | ORAL | Status: DC

## 2012-06-11 MED ORDER — DILTIAZEM HCL 100 MG IV SOLR
5.0000 mg/h | INTRAVENOUS | Status: AC
  Administered 2012-06-11: 10 mg/h via INTRAVENOUS
  Administered 2012-06-11 – 2012-06-12 (×2): 5 mg/h via INTRAVENOUS
  Administered 2012-06-12: 15 mg/h via INTRAVENOUS
  Filled 2012-06-11 (×2): qty 100

## 2012-06-11 MED ORDER — FUROSEMIDE 10 MG/ML IJ SOLN
40.0000 mg | Freq: Every day | INTRAMUSCULAR | Status: DC
  Administered 2012-06-11 – 2012-06-12 (×2): 40 mg via INTRAVENOUS
  Filled 2012-06-11 (×3): qty 4

## 2012-06-11 MED ORDER — DEXTROSE 5 % IV SOLN
1.0000 g | INTRAVENOUS | Status: DC
  Administered 2012-06-11 – 2012-06-14 (×4): 1 g via INTRAVENOUS
  Filled 2012-06-11 (×3): qty 10

## 2012-06-11 MED ORDER — DEXTROSE 5 % IV SOLN
500.0000 mg | Freq: Once | INTRAVENOUS | Status: AC
  Administered 2012-06-11: 500 mg via INTRAVENOUS

## 2012-06-11 MED ORDER — LEVALBUTEROL HCL 0.63 MG/3ML IN NEBU
0.6300 mg | INHALATION_SOLUTION | RESPIRATORY_TRACT | Status: DC | PRN
  Filled 2012-06-11: qty 3

## 2012-06-11 MED ORDER — ASPIRIN 81 MG PO CHEW
81.0000 mg | CHEWABLE_TABLET | Freq: Every day | ORAL | Status: DC
  Administered 2012-06-11 – 2012-06-15 (×5): 81 mg via ORAL
  Filled 2012-06-11 (×5): qty 1

## 2012-06-11 MED ORDER — DILTIAZEM HCL 25 MG/5ML IV SOLN
10.0000 mg | Freq: Once | INTRAVENOUS | Status: AC
  Administered 2012-06-11: 10 mg via INTRAVENOUS
  Filled 2012-06-11: qty 5

## 2012-06-11 MED ORDER — DEXTROSE 5 % IV SOLN
500.0000 mg | INTRAVENOUS | Status: DC
  Administered 2012-06-11 – 2012-06-14 (×4): 500 mg via INTRAVENOUS
  Filled 2012-06-11 (×4): qty 500

## 2012-06-11 MED ORDER — DILTIAZEM HCL 25 MG/5ML IV SOLN: 15.0000 mg | Freq: Once | INTRAVENOUS | Status: DC

## 2012-06-11 MED ORDER — ACETAMINOPHEN 325 MG PO TABS: 650.0000 mg | ORAL_TABLET | Freq: Four times a day (QID) | ORAL | Status: DC | PRN

## 2012-06-11 MED ORDER — ENSURE COMPLETE PO LIQD
237.0000 mL | Freq: Two times a day (BID) | ORAL | Status: DC
  Administered 2012-06-12 – 2012-06-15 (×6): 237 mL via ORAL

## 2012-06-11 MED ORDER — CLOPIDOGREL BISULFATE 75 MG PO TABS
75.0000 mg | ORAL_TABLET | Freq: Every day | ORAL | Status: DC
  Administered 2012-06-12 – 2012-06-15 (×4): 75 mg via ORAL
  Filled 2012-06-11 (×4): qty 1

## 2012-06-11 MED ORDER — POLYVINYL ALCOHOL 1.4 % OP SOLN
1.0000 [drp] | Freq: Three times a day (TID) | OPHTHALMIC | Status: DC
  Administered 2012-06-11 – 2012-06-15 (×10): 1 [drp] via OPHTHALMIC
  Filled 2012-06-11 (×2): qty 15

## 2012-06-11 MED ORDER — ALBUTEROL SULFATE HFA 108 (90 BASE) MCG/ACT IN AERS
2.0000 | INHALATION_SPRAY | Freq: Once | RESPIRATORY_TRACT | Status: AC
  Administered 2012-06-11: 2 via RESPIRATORY_TRACT
  Filled 2012-06-11: qty 6.7

## 2012-06-11 MED ORDER — GUAIFENESIN ER 600 MG PO TB12
600.0000 mg | ORAL_TABLET | Freq: Two times a day (BID) | ORAL | Status: DC
  Administered 2012-06-11 – 2012-06-15 (×8): 600 mg via ORAL
  Filled 2012-06-11 (×10): qty 1

## 2012-06-11 MED ORDER — SODIUM CHLORIDE 0.9 % IJ SOLN
3.0000 mL | Freq: Two times a day (BID) | INTRAMUSCULAR | Status: DC
  Administered 2012-06-14 – 2012-06-15 (×3): 3 mL via INTRAVENOUS

## 2012-06-11 MED ORDER — ATORVASTATIN CALCIUM 10 MG PO TABS
10.0000 mg | ORAL_TABLET | Freq: Every day | ORAL | Status: DC
  Administered 2012-06-11 – 2012-06-14 (×4): 10 mg via ORAL
  Filled 2012-06-11 (×5): qty 1

## 2012-06-11 MED ORDER — ONDANSETRON HCL 4 MG PO TABS: 4.0000 mg | ORAL_TABLET | Freq: Four times a day (QID) | ORAL | Status: DC | PRN

## 2012-06-11 MED ORDER — ACETAMINOPHEN 650 MG RE SUPP: 650.0000 mg | Freq: Four times a day (QID) | RECTAL | Status: DC | PRN

## 2012-06-11 MED ORDER — OSELTAMIVIR PHOSPHATE 75 MG PO CAPS
75.0000 mg | ORAL_CAPSULE | Freq: Two times a day (BID) | ORAL | Status: DC
  Administered 2012-06-11 – 2012-06-12 (×2): 75 mg via ORAL
  Filled 2012-06-11 (×3): qty 1

## 2012-06-11 MED ORDER — LEVALBUTEROL HCL 0.63 MG/3ML IN NEBU
0.6300 mg | INHALATION_SOLUTION | Freq: Three times a day (TID) | RESPIRATORY_TRACT | Status: DC
  Administered 2012-06-11 – 2012-06-15 (×10): 0.63 mg via RESPIRATORY_TRACT
  Filled 2012-06-11 (×14): qty 3

## 2012-06-11 MED ORDER — MORPHINE SULFATE 2 MG/ML IJ SOLN: 2.0000 mg | INTRAMUSCULAR | Status: DC | PRN

## 2012-06-11 MED ORDER — ONDANSETRON HCL 4 MG/2ML IJ SOLN: 4.0000 mg | Freq: Four times a day (QID) | INTRAMUSCULAR | Status: DC | PRN

## 2012-06-11 MED ORDER — FERROUS SULFATE 325 (65 FE) MG PO TABS
325.0000 mg | ORAL_TABLET | Freq: Every day | ORAL | Status: DC
  Administered 2012-06-12 – 2012-06-15 (×4): 325 mg via ORAL
  Filled 2012-06-11 (×5): qty 1

## 2012-06-11 MED ORDER — HEPARIN SODIUM (PORCINE) 5000 UNIT/ML IJ SOLN
5000.0000 [IU] | Freq: Three times a day (TID) | INTRAMUSCULAR | Status: DC
  Administered 2012-06-11 – 2012-06-15 (×11): 5000 [IU] via SUBCUTANEOUS
  Filled 2012-06-11 (×15): qty 1

## 2012-06-11 MED ORDER — CALCIUM POLYCARBOPHIL 625 MG PO TABS
625.0000 mg | ORAL_TABLET | Freq: Every day | ORAL | Status: DC
  Administered 2012-06-11 – 2012-06-14 (×4): 625 mg via ORAL
  Administered 2012-06-15: 11:00:00 via ORAL
  Filled 2012-06-11 (×5): qty 1

## 2012-06-11 MED ORDER — OXYCODONE HCL 5 MG PO TABS: 5.0000 mg | ORAL_TABLET | ORAL | Status: DC | PRN

## 2012-06-11 MED ORDER — DEXTROSE 5 % IV SOLN
1.0000 g | Freq: Once | INTRAVENOUS | Status: AC
  Administered 2012-06-11: 1 g via INTRAVENOUS
  Filled 2012-06-11: qty 10

## 2012-06-11 MED ORDER — SODIUM CHLORIDE 0.9 % IV SOLN: 250.0000 mL | INTRAVENOUS | Status: DC | PRN

## 2012-06-11 MED ORDER — SODIUM CHLORIDE 0.9 % IJ SOLN
3.0000 mL | Freq: Two times a day (BID) | INTRAMUSCULAR | Status: DC
  Administered 2012-06-12 – 2012-06-13 (×2): 3 mL via INTRAVENOUS

## 2012-06-11 MED ORDER — FIBER 625 MG PO TABS: 1.0000 | ORAL_TABLET | Freq: Every day | ORAL | Status: DC

## 2012-06-11 NOTE — H&P (Signed)
PCP:   Willow Ora, MD   Chief Complaint:  Cough/Shortness of breath.   HPI: This is a 79 year old female resident of [facility], with known history of HTN, s/p CVA right insula 12/2011, atrial fibrillation, not anticoagulation candidate, OA, osteopenia, Barrett's esophagus, chronic diarrhea, previous pancreatitis, s/p cholecystectomy, sent to the ED for cough, productive of brownish phlegm since 06/07/12 and shortness of breath, along with some palpitations. In the ED, she was found to be in rapid atrial fibrillation, and to have abnormal CXR. She was administered a single iv Bolus of Cardizem by ED MD, followed by continuous infusion. Patient denies fever, chills or chest pain.   Allergies:  No Known Allergies    Past Medical History  Diagnosis Date  . Hypertension   . Osteopenia   . Atrial fibrillation     not coumadin candidate  . Impaired gait   . Gait abnormality     imbalance  . Urinary incontinence   . Diarrhea     chronic  . Barrett's esophagus   . Osteoarthritis   . Edema leg     chronic, LEFT leg, u/s 2009 neg for DVT  . CVA (cerebral infarction) 01/26/2012    Past Surgical History  Procedure Date  . Cholecystectomy     had pancreatitis  . Hysterectomy-unknown     Prior to Admission medications   Medication Sig Start Date End Date Taking? Authorizing Provider  Artificial Tear Solution (NATURAL BALANCE OP) Place 2 drops into both eyes 3 (three) times daily.   Yes Historical Provider, MD  acetaminophen (TYLENOL) 500 MG tablet Take 1,000 mg by mouth every 6 (six) hours as needed. For pain    Historical Provider, MD  aspirin 81 MG chewable tablet Chew 1 tablet (81 mg total) by mouth daily. 01/29/12 01/28/13  Ripudeep Jenna Luo, MD  atorvastatin (LIPITOR) 10 MG tablet Take 1 tablet (10 mg total) by mouth daily at 6 PM. 01/29/12 01/28/13  Ripudeep Jenna Luo, MD  calcium carbonate (OS-CAL) 600 MG TABS Take 600 mg by mouth daily.    Historical Provider, MD  Calcium Polycarbophil (FIBER)  625 MG TABS Take by mouth daily.      Historical Provider, MD  clopidogrel (PLAVIX) 75 MG tablet Take 1 tablet (75 mg total) by mouth daily with breakfast. 01/29/12 01/28/13  Ripudeep Jenna Luo, MD  diltiazem (CARDIZEM) 60 MG tablet Take 1 tablet (60 mg total) by mouth 3 (three) times daily. 01/29/12 01/28/13  Ripudeep Jenna Luo, MD  feeding supplement (ENSURE COMPLETE) LIQD Take 237 mLs by mouth 2 (two) times daily between meals. 01/29/12   Ripudeep Jenna Luo, MD  ferrous sulfate 325 (65 FE) MG tablet Take 325 mg by mouth daily with breakfast.    Historical Provider, MD    Social History:  reports that she has never smoked. She has never used smokeless tobacco. She reports that she does not drink alcohol or use illicit drugs.  Family History: Both parents are deceased. Patient's daughter has atrial fibrillation, and a stroke.  Review of Systems:  As per HPI and chief complaint. Patent denies fatigue, diminished appetite, weight loss, fever, chills, headache, blurred vision, difficulty in speaking, dysphagia, chest pain, paroxysmal nocturnal dyspnea, nausea, diaphoresis, abdominal pain, vomiting, diarrhea, belching, heartburn, hematemesis, melena, dysuria, nocturia, urinary frequency, hematochezia, lower extremity swelling, pain, or redness. The rest of the systems review is negative.  Physical Exam:  General:  Patient does not appear to be in obvious acute distress. Alert, communicative, fully  oriented, talking in complete sentences, not short of breath at rest.  HEENT:  No clinical pallor, no jaundice, no conjunctival injection or discharge. NECK:  Supple, JVP not seen, no carotid bruits, no palpable lymphadenopathy, no palpable goiter. CHEST:  Bilateral basal crackles, occasional expiratory wheeze. HEART:  Sounds 1 and 2 heard, normal, irregular, tachycardic, no murmurs. ABDOMEN:  Full, soft, non-tender, no palpable organomegaly, no palpable masses, normal bowel sounds. GENITALIA:  Not examined. LOWER  EXTREMITIES:  No pitting edema, palpable peripheral pulses. MUSCULOSKELETAL SYSTEM:  Generalized osteoarthritic changes, otherwise, normal. CENTRAL NERVOUS SYSTEM:  No focal neurologic deficit on gross examination.  Labs on Admission:  Results for orders placed during the hospital encounter of 06/11/12 (from the past 48 hour(s))  CBC WITH DIFFERENTIAL     Status: Abnormal   Collection Time   06/11/12  1:39 PM      Component Value Range Comment   WBC 6.5  4.0 - 10.5 K/uL WHITE COUNT CONFIRMED ON SMEAR   RBC 3.70 (*) 3.87 - 5.11 MIL/uL    Hemoglobin 12.0  12.0 - 15.0 g/dL    HCT 45.4 (*) 09.8 - 46.0 %    MCV 96.8  78.0 - 100.0 fL    MCH 32.4  26.0 - 34.0 pg    MCHC 33.5  30.0 - 36.0 g/dL    RDW 11.9 (*) 14.7 - 15.5 %    Platelets 175  150 - 400 K/uL    Neutrophils Relative 82 (*) 43 - 77 %    Lymphocytes Relative 12  12 - 46 %    Monocytes Relative 6  3 - 12 %    Eosinophils Relative 0  0 - 5 %    Basophils Relative 0  0 - 1 %    Neutro Abs 5.3  1.7 - 7.7 K/uL    Lymphs Abs 0.8  0.7 - 4.0 K/uL    Monocytes Absolute 0.4  0.1 - 1.0 K/uL    Eosinophils Absolute 0.0  0.0 - 0.7 K/uL    Basophils Absolute 0.0  0.0 - 0.1 K/uL    RBC Morphology POLYCHROMASIA PRESENT   TARGET CELLS  COMPREHENSIVE METABOLIC PANEL     Status: Abnormal   Collection Time   06/11/12  1:39 PM      Component Value Range Comment   Sodium 139  135 - 145 mEq/L    Potassium 3.3 (*) 3.5 - 5.1 mEq/L    Chloride 101  96 - 112 mEq/L    CO2 21  19 - 32 mEq/L    Glucose, Bld 129 (*) 70 - 99 mg/dL    BUN 33 (*) 6 - 23 mg/dL    Creatinine, Ser 8.29  0.50 - 1.10 mg/dL    Calcium 8.8  8.4 - 56.2 mg/dL    Total Protein 6.8  6.0 - 8.3 g/dL    Albumin 3.5  3.5 - 5.2 g/dL    AST 35  0 - 37 U/L    ALT 21  0 - 35 U/L    Alkaline Phosphatase 52  39 - 117 U/L    Total Bilirubin 0.8  0.3 - 1.2 mg/dL    GFR calc non Af Amer 45 (*) >90 mL/min    GFR calc Af Amer 52 (*) >90 mL/min   PRO B NATRIURETIC PEPTIDE     Status: Abnormal    Collection Time   06/11/12  1:41 PM      Component Value Range Comment   Pro  B Natriuretic peptide (BNP) 9693.0 (*) 0 - 450 pg/mL   POCT I-STAT TROPONIN I     Status: Normal   Collection Time   06/11/12  2:01 PM      Component Value Range Comment   Troponin i, poc 0.05  0.00 - 0.08 ng/mL    Comment 3              Radiological Exams on Admission: *RADIOLOGY REPORT*  Clinical Data: Chest pain, shortness of breath.  PORTABLE CHEST - 1 VIEW  Comparison: 10/26/2011  Findings: Cardiomegaly. Patchy bilateral interstitial and alveolar  opacities, right greater than left. This represent asymmetric  edema (favored) or infection. Small bilateral pleural effusions.  No acute bony abnormality.  IMPRESSION:  Cardiomegaly. Bilateral interstitial and alveolar opacities, right  greater than left. Favor asymmetric edema. Infection cannot be  completely excluded. Recommend clinical correlation.  Small bilateral effusions.  Original Report Authenticated By: Charlett Nose, M.D.   Assessment/Plan Active Problems:    1. CAP (community acquired pneumonia): patient presented with several days of a productive cough, although she denies fever or chills. CXR demonstrated Bilateral interstitial and alveolar opacities, right greater than left. We shall manage with a combination of rocephin/Azithromycin. For CAP, as well as Mucinex and supportive measures. Due to epidemiologic considerations, we shall commence Tamiflu and check Flu PCR.  2. Atrial Fibrillation: Patient has a known history of chronic atrial fibrillation, and as a matter of fact, is s/p CVA in 12/2011. She has was deemed not an anticoagulation candidate att that time, and has been on ASA/Plavix. On arrival to the ED, she was found to be in rapid atrial fibrillation, with HR of 137. She was administered a single iv Bolus of Cardizem by ED MD, followed by continuous infusion, with improvement in HR to 106 at the time of my evaluation. We shall  continue Cardizem infusion for now. Patient was on Cardizem pre-admission, so a beta-blocker might be needed in addition. We shall monitor telemetrically and cycle cardiac enzymes.   3. CHF (congestive heart failure): In addition to suggesting a pneumonia, CXR was indicative of possible asymmetric pulmonary edema. ProBNP was also elevated at 9693. We shall manage with iv lasix and do a 2D echocardiogram.  4. HTN (hypertension): BP is controlled at this time, on ivi Cardizem.  5. GERD (gastroesophageal reflux disease): Asymptomatic. Patient has a known history of Barrett's esophagus.   Further management will depend on clinical course.  Comment: patient is DNR/DNI.   Main contact is daughter Christell Constant (H): [phone].   Time Spent on Admission: 1 hour.   Maron Stanzione,CHRISTOPHER 06/11/2012, 4:58 PM

## 2012-06-11 NOTE — Telephone Encounter (Signed)
[facility] called stating that in reference to the pt's migraines, they gave the pt the prednisone & hydrocodone that was prescribed yesterday to the pt & now she is complaining of SOB. Her vitals are P: 142 02: 91 BP: 174/95. Please advise.

## 2012-06-11 NOTE — Telephone Encounter (Signed)
Per Dr. Drue Novel, I called [facility] & advised them that the pt needs to go to the ER.

## 2012-06-11 NOTE — ED Notes (Signed)
Pt here from SNF ([facility]) for evaluation of sob which has been getting worse for several weeks.  Pt has had a productive cough per staff.   Pt is in afib 110-150.  Pt denies any CP for ems but reports that she "feels bad".  Pt had SpO2 85% on RA for ems and went to 96% on 4L [place].  Pt is alert and oriented.

## 2012-06-11 NOTE — ED Notes (Signed)
Still await call back from [facility]W

## 2012-06-11 NOTE — ED Provider Notes (Signed)
History     CSN: [id number]  Arrival date & time 06/11/12  1248   None     Chief Complaint  Patient presents with  . Shortness of Breath     HPI chief complaint: Cough and shortness of breath. Onset: Several days ago. Location: Chest. Not improved or worsened by anything. Severity: Mild to moderate. Timing: Intermittent. Context: The patient was sent from [facility]. For signs and symptoms to review of systems. Social history: See nurse's notes. Family history: See below. I have reviewed patient's past medical, past surgical, social history as well as medications and allergies.  Past Medical History  Diagnosis Date  . Hypertension   . Osteopenia   . Atrial fibrillation     not coumadin candidate  . Impaired gait   . Gait abnormality     imbalance  . Urinary incontinence   . Diarrhea     chronic  . Barrett's esophagus   . Osteoarthritis   . Edema leg     chronic, LEFT leg, u/s 2009 neg for DVT  . CVA (cerebral infarction) 01/26/2012    Past Surgical History  Procedure Date  . Cholecystectomy     had pancreatitis  . Hysterectomy-unknown     No family history on file.  History  Substance Use Topics  . Smoking status: Never Smoker   . Smokeless tobacco: Never Used  . Alcohol Use: No    OB History    Grav Para Term Preterm Abortions TAB SAB Ect Mult Living                  Review of Systems  Constitutional: Negative for fever and chills.  HENT: Negative for sore throat, rhinorrhea, mouth sores, trouble swallowing, neck pain, neck stiffness, voice change and sinus pressure.   Eyes: Negative for pain, discharge and itching.  Respiratory: Positive for cough and shortness of breath. Negative for chest tightness.   Cardiovascular: Positive for chest pain. Negative for palpitations and leg swelling.  Gastrointestinal: Negative for nausea, vomiting, abdominal pain, diarrhea, constipation and blood in stool.  Genitourinary: Negative for dysuria,  urgency, frequency, hematuria, flank pain, decreased urine volume, difficulty urinating and pelvic pain.  Musculoskeletal: Negative for myalgias, back pain and joint swelling.  Skin: Negative for rash and wound.  Neurological: Negative for dizziness, tremors, seizures, syncope, facial asymmetry, speech difficulty, weakness, light-headedness, numbness and headaches.  Hematological: Negative for adenopathy. Does not bruise/bleed easily.  Psychiatric/Behavioral: Negative for confusion and decreased concentration.    Allergies  Review of patient's allergies indicates no known allergies.  Home Medications   Current Outpatient Rx  Name  Route  Sig  Dispense  Refill  . ACETAMINOPHEN 500 MG PO TABS   Oral   Take 1,000 mg by mouth every 6 (six) hours as needed. For pain         . ASPIRIN 81 MG PO CHEW   Oral   Chew 1 tablet (81 mg total) by mouth daily.   90 tablet   3   . ATORVASTATIN CALCIUM 10 MG PO TABS   Oral   Take 1 tablet (10 mg total) by mouth daily at 6 PM.   90 tablet   3   . CALCIUM CARBONATE 600 MG PO TABS   Oral   Take 600 mg by mouth daily.         Marland Kitchen FIBER 625 MG PO TABS   Oral   Take by mouth daily.           Marland Kitchen  CLOPIDOGREL BISULFATE 75 MG PO TABS   Oral   Take 1 tablet (75 mg total) by mouth daily with breakfast.   90 tablet   3   . DILTIAZEM HCL 60 MG PO TABS   Oral   Take 1 tablet (60 mg total) by mouth 3 (three) times daily.   90 tablet   3   . ENSURE COMPLETE PO LIQD   Oral   Take 237 mLs by mouth 2 (two) times daily between meals.         Di Kindle SULFATE 325 (65 FE) MG PO TABS   Oral   Take 325 mg by mouth daily with breakfast.         . HYDROCODONE-ACETAMINOPHEN 7.5-325 MG/15ML PO SOLN   Oral   Take 5-10 mLs by mouth every 6 (six) hours as needed for pain.   180 mL   0   . METOPROLOL TARTRATE 50 MG PO TABS   Oral   Take 0.5 tablets (25 mg total) by mouth 2 (two) times daily.   60 tablet   3   . PREDNISONE 10 MG PO TABS       3 tablets a day x 3 days, 2 tablets a day x 3 days, one tablet a day x 3 days   18 tablet   0     There were no vitals taken for this visit.  Physical Exam  Constitutional: She is oriented to person, place, and time. She appears well-developed and well-nourished. No distress.  HENT:  Head: Normocephalic and atraumatic.  Eyes: Conjunctivae normal are normal. Right eye exhibits no discharge. Left eye exhibits no discharge. No scleral icterus.  Neck: Normal range of motion. Neck supple. No JVD present.  Cardiovascular: Normal heart sounds, intact distal pulses and normal pulses.  An irregularly irregular rhythm present. Tachycardia present.   No murmur heard.      Rate 140.  Pulmonary/Chest: Effort normal. No accessory muscle usage. Not tachypneic. No respiratory distress. She has no decreased breath sounds. She has wheezes in the right lower field. She has no rhonchi. She has rales in the right lower field and the left lower field. She exhibits no tenderness.  Abdominal: Soft. Bowel sounds are normal. She exhibits no distension and no mass. There is no tenderness. There is no rebound and no guarding.  Musculoskeletal: Normal range of motion. She exhibits no tenderness.  Neurological: She is alert and oriented to person, place, and time.  Skin: Skin is warm and dry. She is not diaphoretic.  Psychiatric: She has a normal mood and affect.    ED Course  Procedures (including critical care time)  Labs Reviewed  CBC WITH DIFFERENTIAL - Abnormal; Notable for the following:    RBC 3.70 (*)     HCT 35.8 (*)     RDW 15.9 (*)     Neutrophils Relative 82 (*)     All other components within normal limits  COMPREHENSIVE METABOLIC PANEL - Abnormal; Notable for the following:    Potassium 3.3 (*)     Glucose, Bld 129 (*)     BUN 33 (*)     GFR calc non Af Amer 45 (*)     GFR calc Af Amer 52 (*)     All other components within normal limits  PRO B NATRIURETIC PEPTIDE - Abnormal; Notable  for the following:    Pro B Natriuretic peptide (BNP) 9693.0 (*)     All other components within normal limits  POCT I-STAT TROPONIN I   Dg Chest Portable 1 View  06/11/2012  *RADIOLOGY REPORT*  Clinical Data: Chest pain, shortness of breath.  PORTABLE CHEST - 1 VIEW  Comparison: 10/26/2011  Findings: Cardiomegaly.  Patchy bilateral interstitial and alveolar opacities, right greater than left.  This represent asymmetric edema (favored) or infection.  Small bilateral pleural effusions. No acute bony abnormality.  IMPRESSION: Cardiomegaly.  Bilateral interstitial and alveolar opacities, right greater than left.  Favor asymmetric edema.  Infection cannot be completely excluded.  Recommend clinical correlation.  Small bilateral effusions.   Original Report Authenticated By: Charlett Nose, M.D.      1. Atrial fibrillation with RVR   2. Elevated brain natriuretic peptide (BNP) level   3. SOB (shortness of breath)   4. Cough      EKG reviewed and interpreted: Atrial fibrillation rate 137. Indeterminate axis. Borderline right bundle branch block and left anterior fascicular block although indeterminate axis. No ST segment changes. MDM  Patient is a 79 year old female residing at [facility] who states she is felt" poor" for last several days with a productive cough, shortness of breath and mild chest pain. Patient is in atrial fibrillation with RVR in the ED. Hemodynamically stable otherwise. Cardizem ordered. We'll plan for admission.  Rate controlled on Cardizem. Chest x-ray shows pneumonia versus congestive heart failure. Patient covered empirically with antibiotics. Admitted to [facility].         Consuello Masse, MD 06/11/12 [phone]

## 2012-06-12 DIAGNOSIS — E876 Hypokalemia: Secondary | ICD-10-CM

## 2012-06-12 DIAGNOSIS — I10 Essential (primary) hypertension: Secondary | ICD-10-CM

## 2012-06-12 DIAGNOSIS — I059 Rheumatic mitral valve disease, unspecified: Secondary | ICD-10-CM

## 2012-06-12 DIAGNOSIS — K219 Gastro-esophageal reflux disease without esophagitis: Secondary | ICD-10-CM

## 2012-06-12 DIAGNOSIS — E785 Hyperlipidemia, unspecified: Secondary | ICD-10-CM

## 2012-06-12 LAB — BASIC METABOLIC PANEL
Calcium: 8.8 mg/dL (ref 8.4–10.5)
Creatinine, Ser: 0.97 mg/dL (ref 0.50–1.10)
GFR calc Af Amer: 55 mL/min — ABNORMAL LOW (ref >90)
GFR calc non Af Amer: 47 mL/min — ABNORMAL LOW (ref >90)

## 2012-06-12 LAB — CBC
MCH: 32.8 pg (ref 26.0–34.0)
MCHC: 33.9 g/dL (ref 30.0–36.0)
MCV: 96.8 fL (ref 78.0–100.0)
Platelets: 154 10*3/uL (ref 150–400)
RDW: 15.9 % — ABNORMAL HIGH (ref 11.5–15.5)

## 2012-06-12 LAB — INFLUENZA PANEL BY PCR (TYPE A & B)
Influenza A By PCR: NEGATIVE
Influenza B By PCR: NEGATIVE

## 2012-06-12 LAB — PRO B NATRIURETIC PEPTIDE: Pro B Natriuretic peptide (BNP): 9941 pg/mL — ABNORMAL HIGH (ref 0–450)

## 2012-06-12 MED ORDER — DILTIAZEM HCL 60 MG PO TABS
60.0000 mg | ORAL_TABLET | Freq: Three times a day (TID) | ORAL | Status: DC
  Administered 2012-06-12 (×3): 60 mg via ORAL
  Filled 2012-06-12 (×6): qty 1

## 2012-06-12 MED ORDER — PANTOPRAZOLE SODIUM 40 MG PO TBEC
40.0000 mg | DELAYED_RELEASE_TABLET | Freq: Every day | ORAL | Status: DC
  Administered 2012-06-13 – 2012-06-15 (×3): 40 mg via ORAL
  Filled 2012-06-12 (×2): qty 1

## 2012-06-12 MED ORDER — POTASSIUM CHLORIDE CRYS ER 20 MEQ PO TBCR
40.0000 meq | EXTENDED_RELEASE_TABLET | ORAL | Status: AC
  Administered 2012-06-12 (×3): 40 meq via ORAL
  Filled 2012-06-12 (×3): qty 2

## 2012-06-12 MED ORDER — DILTIAZEM HCL 60 MG PO TABS: 60.0000 mg | ORAL_TABLET | Freq: Three times a day (TID) | ORAL | Status: DC

## 2012-06-12 NOTE — Evaluation (Signed)
Occupational Therapy Evaluation Patient Details Name: Sylvia Duncan MRN: [id number] DOB: 1913/04/27 Today's Date: 06/12/2012 Time: 4098-1191 OT Time Calculation (min): 35 min  OT Assessment / Plan / Recommendation Clinical Impression  Pt admitted for h/o SOB over last few weeks at ALF and now has community acquired pneumonia with deficits in areas listed below.  Pt lives at assisted living at [facility] adn has been fairly I there outside of bathing self and household tasks.  Pt would benefit from cont OT to  attempt to reach this PLF.    OT Assessment  Patient needs continued OT Services    Follow Up Recommendations  Home health OT;Other (comment);Supervision/Assistance - 24 hour (HHOT at her ALF)    Barriers to Discharge None Pt should be able to d/c back to ALF  Equipment Recommendations  None recommended by OT    Recommendations for Other Services    Frequency  Min 2X/week    Precautions / Restrictions Precautions Precautions: Fall Restrictions Weight Bearing Restrictions: No   Pertinent Vitals/Pain Pt with no c/o pain.  York Spaniel it is mildly uncomfortable in her chest when she coughs.    ADL  Eating/Feeding: Performed;Set up Where Assessed - Eating/Feeding: Chair Grooming: Simulated;Wash/dry hands;Wash/dry face;Min guard Where Assessed - Grooming: Supported standing Upper Body Bathing: Simulated;Set up Where Assessed - Upper Body Bathing: Supported sitting Lower Body Bathing: Simulated;Moderate assistance Where Assessed - Lower Body Bathing: Supported sit to stand Upper Body Dressing: Simulated;Minimal assistance Where Assessed - Upper Body Dressing: Supported sitting Lower Body Dressing: Minimal assistance;Performed Where Assessed - Lower Body Dressing: Supported sit to stand Toilet Transfer: Performed;Min Pension scheme manager Method: Stand pivot;Sit to Barista: Bedside commode Toileting - Clothing Manipulation and Hygiene: Performed;Minimal  assistance Where Assessed - Engineer, mining and Hygiene: Standing Equipment Used: Rolling walker Transfers/Ambulation Related to ADLs: Pt walked in hallway with PT.  Pt requires min guard assist in room with ambulation with adls. ADL Comments: Pt overall min assist when given extra time.  Pt urinated on floor when standing but stated she usually wears depends at her ALF.      OT Diagnosis: Generalized weakness  OT Problem List: Decreased knowledge of precautions;Decreased strength;Decreased activity tolerance OT Treatment Interventions: Self-care/ADL training;Therapeutic activities   OT Goals Acute Rehab OT Goals OT Goal Formulation: With patient/family Time For Goal Achievement: 06/26/12 Potential to Achieve Goals: Good ADL Goals Pt Will Perform Grooming: with supervision;Standing at sink ADL Goal: Grooming - Progress: Goal set today Pt Will Perform Lower Body Dressing: with min assist;Other (comment) (for ted hose only) ADL Goal: Lower Body Dressing - Progress: Goal set today Pt Will Perform Tub/Shower Transfer: with supervision;Shower seat with back ADL Goal: Web designer - Progress: Goal set today Additional ADL Goal #1: PT will complete all aspects of toileting on comfort height commode with rails. ADL Goal: Additional Goal #1 - Progress: Goal set today  Visit Information  Last OT Received On: 06/12/12 Assistance Needed: +1 PT/OT Co-Evaluation/Treatment: Yes    Subjective Data  Subjective: "I just get tired so fast. Patient Stated Goal: to get back home ([facility])   Prior Functioning     Home Living Lives With: Alone;Other (Comment) (in assisted living) Available Help at Discharge: Other (Comment) (assisted living) Type of Home: Assisted living Home Access: Level entry Home Layout: One level Bathroom Shower/Tub: Health visitor: Handicapped height Bathroom Accessibility: Yes How Accessible: Accessible via wheelchair;Accessible  via walker Home Adaptive Equipment: Shower chair with back;Raised toilet seat with  rails Additional Comments: Pt lives at [facility] facility. 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    Extremity/Trunk Assessment Right Upper Extremity Assessment RUE ROM/Strength/Tone: Within functional levels RUE Sensation: WFL - Light Touch;WFL - Proprioception RUE Coordination: WFL - gross/fine motor Left Upper Extremity Assessment LUE ROM/Strength/Tone: Within functional levels LUE Sensation: WFL - Light Touch;WFL - Proprioception LUE Coordination: WFL - gross/fine motor     Mobility Bed Mobility Bed Mobility: Supine to Sit;Sitting - Scoot to Edge of Bed Supine to Sit: 4: Min guard;HOB flat;Other (comment) (cues to problem solve how to get to EOB) Sitting - Scoot to Edge of Bed: 5: Supervision;Other (comment) (w vcs.) Details for Bed Mobility Assistance: Pt able to get to side of bed with encouragement  Transfers Transfers: Stand to Sit;Sit to Stand Sit to Stand: 4: Min guard;From bed;With armrests Stand to Sit: 4: Min guard;With armrests;To chair/3-in-1     Shoulder Instructions     Exercise     Balance     End  of Session OT - End of Session Activity Tolerance: Patient tolerated treatment well Patient left: in chair;with call bell/phone within reach;with family/visitor present Nurse Communication: Mobility status  GO     Hope Budds 06/12/2012, 12:15 PM [phone]

## 2012-06-12 NOTE — Progress Notes (Addendum)
[facility] PROGRESS NOTE  Sylvia Duncan FAO:[id number] DOB: 1913-02-18 DOA: 06/11/2012 PCP: Willow Ora, MD  Assessment/Plan: 1-CAP: will follow cultures. Continue IV antibiotics. Will also start IS. Patient overall doing better and improving. No fever.  2-atrial fibrillation: off cardizem drip. Continue home PO dose of cardizem; not a good candidate for anticoagulation.  3-Mild exacerbation of diastolic heart failure: patient 2-d echo with normal EF (55%) and no wall motion abnormalities. Will continue IV lasix for today and trasition to PO tomorrow. No significant crackles on auscultation today.  4-HTN: stable overall. Will monitor.  5-GERD: continue PPI  6-HLD: continue statins.  7-hypokalemia: due to diuretics. Will replete and check Mg.  DVT: heparin.  Code Status: DNR Family Communication: daughter, sister and significant others at bedside Disposition Plan: Will evaluate conditioning anda ADL's capacity due to acute infection; might require SNF   Consultants:  PT/OT  Procedures:  See below  Antibiotics:  zithromax and rocephin  HPI/Subjective: Afebrile; still with some cough and feeling weak. Patient now off cardizem drip and with controlled rate. Negative influenza panel.  Objective: Filed Vitals:   06/12/12 1202 06/12/12 1407 06/12/12 1542 06/12/12 1655  BP:  120/81  121/85  Pulse: 118 107    Temp:  97.4 F (36.3 C)    TempSrc:  Oral    Resp:  20    Height:      SpO2: 95% 97% 97%     Intake/Output Summary (Last 24 hours) at 06/12/12 1730 Last data filed at 06/12/12 1318  Gross per 24 hour  Intake    240 ml  Output      0 ml  Net    240 ml   There were no vitals filed for this visit.  Exam:   General:  Afebrile, feeling better, no CP, no significant SOB. Feeling weak  Cardiovascular: irregular; no rubs or gallops  Respiratory: decreased BS at baseline, no wheezing; positive diffuse rhonchi  Abdomen: soft, NT, ND, positive  BS  Neuro: non focal.  Data Reviewed: Basic Metabolic Panel:  Lab 06/12/12 6962 06/11/12 1737 06/11/12 1339  NA 140 -- 139  K 2.9* -- 3.3*  CL 100 -- 101  CO2 29 -- 21  GLUCOSE 123* -- 129*  BUN 27* -- 33*  CREATININE 0.97 0.95 1.01  CALCIUM 8.8 -- 8.8  MG -- -- --  PHOS -- -- --   Liver Function Tests:  Lab 06/11/12 1339  AST 35  ALT 21  ALKPHOS 52  BILITOT 0.8  PROT 6.8  ALBUMIN 3.5   CBC:  Lab 06/12/12 0800 06/11/12 1737 06/11/12 1339  WBC 7.1 5.9 6.5  NEUTROABS -- -- 5.3  HGB 11.3* 12.5 12.0  HCT 33.3* 36.9 35.8*  MCV 96.8 97.1 96.8  PLT 154 170 175   Cardiac Enzymes:  Lab 06/12/12 0810 06/12/12 0050 06/11/12 1750  CKTOTAL -- -- --  CKMB -- -- --  CKMBINDEX -- -- --  TROPONINI <0.30 <0.30 <0.30   BNP (last 3 results)  Basename 06/12/12 0800 06/11/12 1341  PROBNP 9941.0* 9693.0*    Studies: Dg Chest Portable 1 View  06/11/2012  *RADIOLOGY REPORT*  Clinical Data: Chest pain, shortness of breath.  PORTABLE CHEST - 1 VIEW  Comparison: 10/26/2011  Findings: Cardiomegaly.  Patchy bilateral interstitial and alveolar opacities, right greater than left.  This represent asymmetric edema (favored) or infection.  Small bilateral pleural effusions. No acute bony abnormality.  IMPRESSION: Cardiomegaly.  Bilateral interstitial and alveolar opacities, right greater than left.  Favor asymmetric edema.  Infection cannot be completely excluded.  Recommend clinical correlation.  Small bilateral effusions.   Original Report Authenticated By: Charlett Nose, M.D.     Scheduled Meds:   . aspirin  81 mg Oral Daily  . atorvastatin  10 mg Oral q1800  . azithromycin  500 mg Intravenous Q24H  . calcium carbonate  500 mg of elemental calcium Oral Q breakfast  . cefTRIAXone (ROCEPHIN)  IV  1 g Intravenous Q24H  . clopidogrel  75 mg Oral Q breakfast  . diltiazem  60 mg Oral TID  . feeding supplement  237 mL Oral BID BM  . ferrous sulfate  325 mg Oral Q breakfast  . furosemide   40 mg Intravenous Daily  . guaiFENesin  600 mg Oral BID  . heparin  5,000 Units Subcutaneous Q8H  . levalbuterol  0.63 mg Nebulization TID  . oseltamivir  75 mg Oral BID  . polycarbophil  625 mg Oral Daily  . polyvinyl alcohol  1 drop Both Eyes TID  . potassium chloride  40 mEq Oral Q4H  . sodium chloride  3 mL Intravenous Q12H  . sodium chloride  3 mL Intravenous Q12H   Continuous Infusions:   Active Problems:  CAP (community acquired pneumonia)  CHF (congestive heart failure)  HTN (hypertension)  GERD (gastroesophageal reflux disease)    Time spent: >30 minutes    Sylvia Duncan  [facility] Pager [phone]. If 8PM-8AM, please contact night-coverage at [email or web address], password [facility] 06/12/2012, 5:30 PM  LOS: 1 day

## 2012-06-12 NOTE — Evaluation (Signed)
Physical Therapy Evaluation Patient Details Name: Sylvia Duncan MRN: [id number] DOB: 1912/10/16 Today's Date: 06/12/2012 Time: 1130-1200 PT Time Calculation (min): 30 min  PT Assessment / Plan / Recommendation Clinical Impression  Patient is a 79 y.o. female admitted with pneumonia.  She will benefit from skilled PT in the acute setting to maximize independence to allow return to ALF at [facility] with HHPTto follow.    PT Assessment  Patient needs continued PT services    Follow Up Recommendations  Home health PT          Equipment Recommendations  None recommended by PT       Frequency Min 3X/week    Precautions / Restrictions Precautions Precautions: Fall Restrictions Weight Bearing Restrictions: No   Pertinent Vitals/Pain Denies pain; HR 109 A-fib with ambulation on 2 LO2 with SpO2 94%      Mobility  Bed Mobility Bed Mobility: Supine to Sit;Sitting - Scoot to Edge of Bed Supine to Sit: 4: Min guard;HOB flat;Other (comment) (cues to problem solve how to get to EOB) Sitting - Scoot to Edge of Bed: 5: Supervision;Other (comment) (w vcs.) Details for Bed Mobility Assistance: Pt able to get to side of bed with encouragement  Transfers Sit to Stand: 4: Min guard;From bed;With armrests Stand to Sit: 4: Min guard;With armrests;To chair/3-in-1 Ambulation/Gait Ambulation/Gait Assistance: 4: Min assist Ambulation Distance (Feet): 75 Feet Assistive device: Rolling walker Ambulation/Gait Assistance Details: slow, guarded, c/o weakness with ambulation Gait Pattern: Decreased stride length;Narrow base of support              PT Diagnosis: Generalized weakness;Abnormality of gait  PT Problem List: Decreased activity tolerance;Decreased balance;Decreased strength;Cardiopulmonary status limiting activity PT Treatment Interventions: DME instruction;Gait training;Functional mobility training;Therapeutic activities;Therapeutic exercise;Balance training;Patient/family education     PT Goals Acute Rehab PT Goals PT Goal Formulation: With patient Time For Goal Achievement: 06/26/12 Potential to Achieve Goals: Good Pt will go Supine/Side to Sit: with modified independence PT Goal: Supine/Side to Sit - Progress: Goal set today Pt will go Sit to Supine/Side: with modified independence PT Goal: Sit to Supine/Side - Progress: Goal set today Pt will go Sit to Stand: with modified independence;with upper extremity assist PT Goal: Sit to Stand - Progress: Goal set today Pt will go Stand to Sit: with modified independence;with upper extremity assist PT Goal: Stand to Sit - Progress: Goal set today Pt will Stand: 3 - 5 min;with unilateral upper extremity support;with supervision (during functional activity) PT Goal: Stand - Progress: Goal set today Pt will Ambulate: 51 - 150 feet;with supervision;with rolling walker PT Goal: Ambulate - Progress: Goal set today  Visit Information  Last PT Received On: 06/12/12 Assistance Needed: +1 PT/OT Co-Evaluation/Treatment: Yes    Subjective Data  Subjective: They've been walking me to the bathroom Patient Stated Goal: To go back home   Prior Functioning  Home Living Lives With: Alone Available Help at Discharge: Other (Comment) (assisted living) Type of Home: Assisted living Home Access: Level entry Home Layout: One level Bathroom Shower/Tub: Health visitor: Handicapped height Bathroom Accessibility: Yes How Accessible: Accessible via wheelchair;Accessible via walker Home Adaptive Equipment: Shower chair with back;Raised toilet seat with rails Additional Comments: Pt lives at [facility] facility. 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    Extremity/Trunk Assessment Right Upper Extremity Assessment RUE ROM/Strength/Tone: Within functional levels RUE Sensation: WFL - Light Touch;WFL - Proprioception RUE Coordination: WFL - gross/fine motor Left Upper Extremity Assessment LUE ROM/Strength/Tone: Within functional levels LUE Sensation: WFL - Light Touch;WFL - Proprioception LUE Coordination: WFL - gross/fine motor Right Lower Extremity Assessment RLE ROM/Strength/Tone: WFL for tasks assessed Left Lower Extremity Assessment LLE ROM/Strength/Tone: WFL for tasks assessed Trunk Assessment Trunk Assessment: Kyphotic   Balance Balance Balance Assessed: Yes Dynamic Standing Balance Dynamic Standing - Balance Support: Left upper extremity supported Dynamic Standing - Level of Assistance: 4: Min assist Dynamic Standing - Comments: during functional activity (performing hygiene after toileting)  End of Session PT - End of Session Equipment Utilized During Treatment: Gait belt Activity Tolerance: Patient limited by fatigue Patient left: in chair;with call bell/phone within reach;with family/visitor present  GP     [facility] 06/12/2012, 1:46 PM  Sheran Lawless, PT [phone] 06/12/2012

## 2012-06-12 NOTE — ED Provider Notes (Signed)
I saw and evaluated the patient, reviewed the resident's note and I agree with the findings and plan. I reviewed and agree with ECG interpretation by Dr. March Rummage.  Pt with dyspnea, rales on exam, atrial fib with RVR seen on monitor.  Confirmed on 12 lead ECG.  No obv ischemia noted.  CXR shows assymetric edema versus infiltrates per radiologist.  I favor new onset a fib causing pulmonary edema and CHF exacerbation.  WBC is ok, temperature normal.  Will need admission for resp disstress, CHF, possible pneumonia.  CRITICAL CARE Performed by: Lear Ng   Total critical care time: 30 min  Critical care time was exclusive of separately billable procedures and treating other patients.  Critical care was necessary to treat or prevent imminent or life-threatening deterioration.  Critical care was time spent personally by me on the following activities: development of treatment plan with patient and/or surrogate as well as nursing, discussions with consultants, evaluation of patient's response to treatment, examination of patient, obtaining history from patient or surrogate, ordering and performing treatments and interventions, ordering and review of laboratory studies, ordering and review of radiographic studies, pulse oximetry and re-evaluation of patient's condition.   On diltiazem drip, HR improved to 110, breathing improved. Abx given to cover for pneumonia.     Impression: Respiratory distress Atrial fib with RVR Pulmonary edema R/o pneumonia  Gavin Pound. Oletta Lamas, MD 06/12/12 1610

## 2012-06-12 NOTE — Clinical Social Work Psychosocial (Signed)
     Clinical Social Work Department BRIEF PSYCHOSOCIAL ASSESSMENT 06/12/2012  Patient:  Sylvia Duncan, Sylvia Duncan     Account Number:  [id number]     Admit date:  06/11/2012  Clinical Social Worker:  Margaree Mackintosh  Date/Time:  06/12/2012 12:00 M  Referred by:  Physician  Date Referred:  06/12/2012 Referred for  ALF Placement   Other Referral:   Interview type:  Patient Other interview type:    PSYCHOSOCIAL DATA Living Status:  FACILITY [facility] [place] Level of care:  Assisted Living Primary support name:  Christell Constant: [phone] Primary support relationship to patient:  CHILD, ADULT Degree of support available:   Unknown.    CURRENT CONCERNS Current Concerns  Post-Acute Placement   Other Concerns:    SOCIAL WORK ASSESSMENT / PLAN Clinical Social Worker recieved referral indicating pt is from [facility].  CSW reviewed chart and met with pt at bedside.  CSW introduced self, explained role, and provided support.  CSW provided active listening.  CSW validated pt's feelings.  Pt appeared to speak openly about "wanting to see Heaven" but has no desires "to do this on my (pt) own time, but God's time".  Pt spoke at length about family support and friends.  Pt agreeable to returning to [facility]. CSW staffed case with RN.  CSW spoke with Lanora Manis at [place]; pt welcome to return.  Per [facility], pt enjoys her roommate and speaks of Heaven frequently as she studied the bible in college.  CSW submitted updated clinical informaiton for ALF review.   Assessment/plan status:  Information/Referral to [facility] Other assessment/ plan:   Information/referral to community resources:   ALF.    PATIENTS/FAMILYS RESPONSE TO PLAN OF CARE: Pt was pleasant and engaged in conversation.  Pt appeared receptive to CSW intervention.

## 2012-06-12 NOTE — Progress Notes (Signed)
  Echocardiogram 2D Echocardiogram has been performed.  Ravin Denardo, [facility] 06/12/2012, 11:26 AM

## 2012-06-12 NOTE — Progress Notes (Signed)
Pt afib ranging from 110-140. BP 102/68 manually. MD on-call paged. Scheduled PO cardizem given. Order given to monitor after PO dose and call back if pt sustains 120-140's. Pt resting comfortably and asymptomatic at this time. Will continue to monitor.  Harless Litten, RN 06/12/12

## 2012-06-13 ENCOUNTER — Inpatient Hospital Stay (HOSPITAL_COMMUNITY): Payer: Medicare Other

## 2012-06-13 DIAGNOSIS — R0602 Shortness of breath: Secondary | ICD-10-CM

## 2012-06-13 LAB — URINALYSIS, ROUTINE W REFLEX MICROSCOPIC
Hgb urine dipstick: NEGATIVE
Protein, ur: NEGATIVE mg/dL
Urobilinogen, UA: 0.2 mg/dL (ref 0.0–1.0)

## 2012-06-13 LAB — BASIC METABOLIC PANEL
BUN: 37 mg/dL — ABNORMAL HIGH (ref 6–23)
GFR calc Af Amer: 42 mL/min — ABNORMAL LOW (ref >90)
GFR calc non Af Amer: 36 mL/min — ABNORMAL LOW (ref >90)
Potassium: 5 mEq/L (ref 3.5–5.1)
Sodium: 139 mEq/L (ref 135–145)

## 2012-06-13 LAB — CBC
MCHC: 33.4 g/dL (ref 30.0–36.0)
RDW: 16.3 % — ABNORMAL HIGH (ref 11.5–15.5)

## 2012-06-13 MED ORDER — DILTIAZEM HCL 60 MG PO TABS
60.0000 mg | ORAL_TABLET | Freq: Four times a day (QID) | ORAL | Status: DC
  Administered 2012-06-13 – 2012-06-15 (×9): 60 mg via ORAL
  Filled 2012-06-13 (×12): qty 1

## 2012-06-13 MED ORDER — FUROSEMIDE 40 MG PO TABS
40.0000 mg | ORAL_TABLET | Freq: Every day | ORAL | Status: DC
  Filled 2012-06-13: qty 1

## 2012-06-13 MED ORDER — FUROSEMIDE 40 MG PO TABS
40.0000 mg | ORAL_TABLET | Freq: Every day | ORAL | Status: DC
  Administered 2012-06-13 – 2012-06-15 (×3): 40 mg via ORAL
  Filled 2012-06-13 (×3): qty 1

## 2012-06-13 MED ORDER — DIGOXIN 0.25 MG/ML IJ SOLN
0.1250 mg | Freq: Once | INTRAMUSCULAR | Status: AC
  Administered 2012-06-13: 0.125 mg via INTRAVENOUS
  Filled 2012-06-13: qty 0.5

## 2012-06-13 MED ORDER — LORAZEPAM 0.5 MG PO TABS
0.2500 mg | ORAL_TABLET | Freq: Once | ORAL | Status: AC
  Administered 2012-06-13: 0.25 mg via ORAL
  Filled 2012-06-13: qty 1

## 2012-06-13 NOTE — Progress Notes (Signed)
[facility] PROGRESS NOTE  Sylvia Duncan ZOX:[id number] DOB: [birth year] DOA: 06/11/2012 PCP: Willow Ora, MD  Assessment/Plan: 1-CAP: will follow cultures. Continue IV antibiotics. Will also start IS. Patient overall doing better and improving. No fever. Continue IV antibiotics for one more day; follow PT/OT recommendations; start weaning oxygen as tolerated.  2-atrial fibrillation: off cardizem drip. Rate still fluctuating. Will increase cardizem to 60mg  QID; follow BP and HR closely.  3-Mild exacerbation of diastolic heart failure: patient 2-D echo with normal EF (55%) and no wall motion abnormalities. Will change lasix to PO. Air movement improved; CXR still with some extra fluid appreciated (R>L); but no significant crackles.  4-HTN: stable overall. Will monitor.  5-GERD: continue PPI  6-HLD: continue statins.  7-hypokalemia: repleted. BMET in am  8-Acute on chronic renal failure: patient with stage 2 CKD at baseline. Worse due to lasix most likely. Will change lasix to PO and follow Cr trend.  DVT: heparin.  Code Status: DNR Family Communication: daughter, sister and significant others at bedside Disposition Plan: Will evaluate conditioning anda ADL's capacity due to acute infection; might require SNF   Consultants:  PT/OT  Procedures:  See below  Antibiotics:  zithromax and rocephin  HPI/Subjective: Afebrile; no CP, improved breathing. Negative influenza panel. Patient HR elevated throughout the night with range up to 130 (sustained); received digoxin with improvements in her rate.  Objective: Filed Vitals:   06/12/12 2100 06/12/12 2205 06/13/12 0100 06/13/12 0500  BP: 92/66 102/68  106/71  Pulse: 114 126  63  Temp: 97.5 F (36.4 C)   97.7 F (36.5 C)  TempSrc: Oral   Oral  Resp: 20 18  16   Height:      Weight:   65.046 kg (143 lb 6.4 oz)   SpO2: 94%   95%    Intake/Output Summary (Last 24 hours) at 06/13/12 0839 Last data filed at 06/13/12  0200  Gross per 24 hour  Intake    240 ml  Output    250 ml  Net    -10 ml   Filed Weights   06/13/12 0100  Weight: 65.046 kg (143 lb 6.4 oz)    Exam:   General:  Afebrile, a little confuse but breathing better, no CP. Feeling weak and tired  Cardiovascular: irregular; no rubs or gallops  Respiratory: decreased BS at baseline, no wheezing; positive diffuse rhonchi  Abdomen: soft, NT, ND, positive BS  Neuro: non focal.  Data Reviewed: Basic Metabolic Panel:  Lab 06/13/12 8119 06/12/12 0800 06/11/12 1737 06/11/12 1339  NA 139 140 -- 139  K 5.0 2.9* -- 3.3*  CL 101 100 -- 101  CO2 26 29 -- 21  GLUCOSE 123* 123* -- 129*  BUN 37* 27* -- 33*  CREATININE 1.22* 0.97 0.95 1.01  CALCIUM 9.0 8.8 -- 8.8  MG 2.1 -- -- --  PHOS -- -- -- --   Liver Function Tests:  Lab 06/11/12 1339  AST 35  ALT 21  ALKPHOS 52  BILITOT 0.8  PROT 6.8  ALBUMIN 3.5   CBC:  Lab 06/13/12 0512 06/12/12 0800 06/11/12 1737 06/11/12 1339  WBC 9.4 7.1 5.9 6.5  NEUTROABS -- -- -- 5.3  HGB 11.8* 11.3* 12.5 12.0  HCT 35.3* 33.3* 36.9 35.8*  MCV 97.5 96.8 97.1 96.8  PLT 173 154 170 175   Cardiac Enzymes:  Lab 06/12/12 0810 06/12/12 0050 06/11/12 1750  CKTOTAL -- -- --  CKMB -- -- --  CKMBINDEX -- -- --  TROPONINI <  0.30 <0.30 <0.30   BNP (last 3 results)  Basename 06/12/12 0800 06/11/12 1341  PROBNP 9941.0* 9693.0*    Studies: Dg Chest 2 View  06/13/2012  *RADIOLOGY REPORT*  Clinical Data: Follow up of shortness of breath and pneumonia.  CHEST - 2 VIEW  Comparison: 06/11/2012  Findings: Cardiomegaly accentuated by AP portable technique.  Small left pleural effusion.  Probable trace right pleural fluid. No pneumothorax.  Minimal worsening in the right greater than left interstitial and airspace opacities.  Somewhat more confluent developing opacity at the right upper lobe.  IMPRESSION: Worsened aeration, especially on the right.  Favor congestive heart failure.  Developing infection of the  right upper lobe and left lower lobe cannot be excluded.  Similar left greater than right bilateral pleural effusions.   Original Report Authenticated By: Jeronimo Greaves, M.D.    Dg Chest Portable 1 View  06/11/2012  *RADIOLOGY REPORT*  Clinical Data: Chest pain, shortness of breath.  PORTABLE CHEST - 1 VIEW  Comparison: 10/26/2011  Findings: Cardiomegaly.  Patchy bilateral interstitial and alveolar opacities, right greater than left.  This represent asymmetric edema (favored) or infection.  Small bilateral pleural effusions. No acute bony abnormality.  IMPRESSION: Cardiomegaly.  Bilateral interstitial and alveolar opacities, right greater than left.  Favor asymmetric edema.  Infection cannot be completely excluded.  Recommend clinical correlation.  Small bilateral effusions.   Original Report Authenticated By: Charlett Nose, M.D.     Scheduled Meds:    . aspirin  81 mg Oral Daily  . atorvastatin  10 mg Oral q1800  . azithromycin  500 mg Intravenous Q24H  . calcium carbonate  500 mg of elemental calcium Oral Q breakfast  . cefTRIAXone (ROCEPHIN)  IV  1 g Intravenous Q24H  . clopidogrel  75 mg Oral Q breakfast  . diltiazem  60 mg Oral QID  . feeding supplement  237 mL Oral BID BM  . ferrous sulfate  325 mg Oral Q breakfast  . furosemide  40 mg Oral Daily  . guaiFENesin  600 mg Oral BID  . heparin  5,000 Units Subcutaneous Q8H  . levalbuterol  0.63 mg Nebulization TID  . pantoprazole  40 mg Oral Q1200  . polycarbophil  625 mg Oral Daily  . polyvinyl alcohol  1 drop Both Eyes TID  . sodium chloride  3 mL Intravenous Q12H  . sodium chloride  3 mL Intravenous Q12H     Time spent: >30 minutes    Kaede Clendenen  [facility] Pager [phone]. If 8PM-8AM, please contact night-coverage at [email or web address], password [facility] 06/13/2012, 8:39 AM  LOS: 2 days

## 2012-06-13 NOTE — Care Management Note (Unsigned)
    Page 1 of 1   06/13/2012     2:14:32 PM   CARE MANAGEMENT NOTE 06/13/2012  Patient:  Sylvia Duncan, Sylvia Duncan   Account Number:  [id number]  Date Initiated:  06/13/2012  Documentation initiated by:  GRAVES-BIGELOW,Nylee Barbuto  Subjective/Objective Assessment:   Pt admitted with Cough/Shortness of breath from [place]. Was found to be in rapid atrial fibrillation. Iv cardizem initiated.     Action/Plan:   CM will continue to monitor for disposition needs.   Anticipated DC Date:  06/15/2012   Anticipated DC Plan:  SKILLED NURSING FACILITY      DC Planning Services  CM consult      Choice offered to / List presented to:             Status of service:  In process, will continue to follow Medicare Important Message given?   (If response is "NO", the following Medicare IM given date fields will be blank) Date Medicare IM given:   Date Additional Medicare IM given:    Discharge Disposition:    Per UR Regulation:  Reviewed for med. necessity/level of care/duration of stay  If discussed at Long Length of Stay Meetings, dates discussed:    Comments:

## 2012-06-13 NOTE — Progress Notes (Signed)
Pt afib 120-130's. MD on-call paged. Order given for 0.125mg  Digoxin IV. Will continue to monitor.  Harless Litten, RN 06/13/12

## 2012-06-14 LAB — URINE CULTURE
Colony Count: NO GROWTH
Culture: NO GROWTH

## 2012-06-14 LAB — BASIC METABOLIC PANEL
BUN: 26 mg/dL — ABNORMAL HIGH (ref 6–23)
GFR calc Af Amer: 55 mL/min — ABNORMAL LOW (ref >90)
GFR calc non Af Amer: 47 mL/min — ABNORMAL LOW (ref >90)
Potassium: 4.1 mEq/L (ref 3.5–5.1)
Sodium: 138 mEq/L (ref 135–145)

## 2012-06-14 MED ORDER — AMOXICILLIN-POT CLAVULANATE 875-125 MG PO TABS
1.0000 | ORAL_TABLET | Freq: Two times a day (BID) | ORAL | Status: DC
  Administered 2012-06-14 – 2012-06-15 (×2): 1 via ORAL
  Filled 2012-06-14 (×3): qty 1

## 2012-06-14 NOTE — Progress Notes (Signed)
Occupational Therapy Treatment Patient Details Name: Sylvia Duncan MRN: [id number] DOB: [birth year] Today's Date: 06/14/2012 Time: 4098-1191 OT Time Calculation (min): 20 min  OT Assessment / Plan / Recommendation Comments on Treatment Session Pt progressing well this session. PT is moderate fall risk and will need (A) for d/c planning. Pt is able to complete adls with increased time and rest breaks    Follow Up Recommendations  Home health OT;Other (comment);Supervision/Assistance - 24 hour    Barriers to Discharge       Equipment Recommendations  None recommended by OT    Recommendations for Other Services    Frequency Min 2X/week   Plan Discharge plan remains appropriate    Precautions / Restrictions Precautions Precautions: Fall Restrictions Weight Bearing Restrictions: No   Pertinent Vitals/Pain No pain    ADL  Grooming: Wash/dry hands;Wash/dry face;Supervision/safety Where Assessed - Grooming: Unsupported standing (Lt ue support on surface) Lower Body Bathing: Supervision/safety Where Assessed - Lower Body Bathing: Unsupported sit to stand Lower Body Dressing: Supervision/safety Where Assessed - Lower Body Dressing: Unsupported sit to stand Toilet Transfer: Supervision/safety Toilet Transfer Method: Sit to Barista: Raised toilet seat with arms (or 3-in-1 over toilet) Toileting - Clothing Manipulation and Hygiene: Supervision/safety Where Assessed - Toileting Clothing Manipulation and Hygiene: Sit to stand from 3-in-1 or toilet Equipment Used: Gait belt;Rolling walker Transfers/Ambulation Related to ADLs: Pt ambulating with decr gait velocity and tactile input for safety inside RW. pt unable to navigate narrow spaces with RW without (A) ADL Comments: Pt completed bed mobility, toilet transfer, LB bathing s/p toileting for hygiene, static standing at sink and chair transfer. pt progressing well and demonstrates decr activity tolerance.    OT  Diagnosis:    OT Problem List:   OT Treatment Interventions:     OT Goals Acute Rehab OT Goals OT Goal Formulation: With patient/family Time For Goal Achievement: 06/26/12 Potential to Achieve Goals: Good ADL Goals Pt Will Perform Grooming: with supervision;Standing at sink ADL Goal: Grooming - Progress: Progressing toward goals Pt Will Perform Lower Body Dressing: with min assist;Other (comment) ADL Goal: Lower Body Dressing - Progress: Progressing toward goals Pt Will Perform Tub/Shower Transfer: with supervision;Shower seat with back Additional ADL Goal #1: PT will complete all aspects of toileting on comfort height commode with rails. ADL Goal: Additional Goal #1 - Progress: Met  Visit Information  Last OT Received On: 06/14/12 Assistance Needed: +1    Subjective Data      Prior Functioning       Cognition  Overall Cognitive Status: History of cognitive impairments - at baseline Arousal/Alertness: Awake/alert Orientation Level: Appears intact for tasks assessed Behavior During Session: [facility] for tasks performed    Mobility  Shoulder Instructions Bed Mobility Bed Mobility: Supine to Sit;Sitting - Scoot to Edge of Bed Supine to Sit: 5: Supervision;With rails Sitting - Scoot to Edge of Bed: 5: Supervision Details for Bed Mobility Assistance: Pt completed bed mobility with incr time Transfers Sit to Stand: 5: Supervision;With upper extremity assist;From bed Stand to Sit: 5: Supervision;With upper extremity assist;To chair/3-in-1 Details for Transfer Assistance: cues for hand placement. pt sitting 1 out 3 attempts with hands holding RW.        Exercises      Balance     End of Session OT - End of Session Activity Tolerance: Patient tolerated treatment well Patient left: in chair;with call bell/phone within reach Nurse Communication: Mobility status;Precautions  GO     Lucile Shutters 06/14/2012,  1:34 PM Pager: [phone]

## 2012-06-14 NOTE — Progress Notes (Signed)
[facility] PROGRESS NOTE  Sylvia Duncan QMV:[id number] DOB: 1912-10-31 DOA: 06/11/2012 PCP: Willow Ora, MD  Assessment/Plan: 1-CAP: will follow cultures, negative up to date. Change to PO antibiotics. Will continue IS. Patient overall doing better and breathing continue improving; now no requiring oxygen supplementation. No fever, WBC's WNL.PEr PT/OT will require [facility] services for deconditioning, but ok to return to ALF. 2-atrial fibrillation: rate well controlled. Will continue current medications dose.  3-Mild exacerbation of diastolic heart failure: patient 2-D echo with normal EF (55%) and no wall motion abnormalities. Will continue lasix current PO dose; patient will be also instructed to follow low sodium diet.  4-HTN: stable overall. Will monitor and continue current meds.  5-GERD: continue PPI  6-HLD: continue statins.  7-hypokalemia: repleted. BMET demonstrating K WNL.  8-Acute on chronic renal failure: patient with stage 2 CKD at baseline. Worse due to acute diuresis. Patient Cr now back to baseline. Will monitor.  9-Physical deconditioning: will arrange for [facility] PT/OT at discharge. Ok for patient to return to ALF.  DVT: heparin.  Code Status: DNR Family Communication: daughter, sister and significant others at bedside Disposition Plan: Will evaluate conditioning anda ADL's capacity due to acute infection; might require SNF   Consultants:  PT/OT  Procedures:  See below  Antibiotics:  zithromax and rocephin 1/14--1/17  augmentin 1/17  HPI/Subjective: Afebrile; no CP, improved breathing. Negative influenza panel. Patient HR well controlled now. Off oxygen supplementation with O2 sat above 90%; negative approx 2L.  Objective: Filed Vitals:   06/14/12 1406 06/14/12 1454 06/14/12 1740 06/14/12 2024  BP: 104/81  114/79 106/75  Pulse: 132   90  Temp: 97.8 F (36.6 C)   98.3 F (36.8 C)  TempSrc: Oral   Oral  Resp: 16   15  Height:      Weight:        SpO2: 99% 98%  96%    Intake/Output Summary (Last 24 hours) at 06/14/12 2105 Last data filed at 06/14/12 1424  Gross per 24 hour  Intake      0 ml  Output   1150 ml  Net  -1150 ml   Filed Weights   06/13/12 0100  Weight: 65.046 kg (143 lb 6.4 oz)    Exam:   General:  Afebrile, Alert and oriented; feeling better overall and complaining just of weakness.  Cardiovascular: irregular; no rubs or gallops  Respiratory: improved air movement, scattered rhonchi and no wheezing.  Abdomen: soft, NT, ND, positive BS  Neuro: non focal.  Data Reviewed: Basic Metabolic Panel:  Lab 06/14/12 2841 06/13/12 0512 06/12/12 0800 06/11/12 1737 06/11/12 1339  NA 138 139 140 -- 139  K 4.1 5.0 2.9* -- 3.3*  CL 99 101 100 -- 101  CO2 32 26 29 -- 21  GLUCOSE 101* 123* 123* -- 129*  BUN 26* 37* 27* -- 33*  CREATININE 0.97 1.22* 0.97 0.95 1.01  CALCIUM 8.6 9.0 8.8 -- 8.8  MG -- 2.1 -- -- --  PHOS -- -- -- -- --   Liver Function Tests:  Lab 06/11/12 1339  AST 35  ALT 21  ALKPHOS 52  BILITOT 0.8  PROT 6.8  ALBUMIN 3.5   CBC:  Lab 06/13/12 0512 06/12/12 0800 06/11/12 1737 06/11/12 1339  WBC 9.4 7.1 5.9 6.5  NEUTROABS -- -- -- 5.3  HGB 11.8* 11.3* 12.5 12.0  HCT 35.3* 33.3* 36.9 35.8*  MCV 97.5 96.8 97.1 96.8  PLT 173 154 170 175   Cardiac Enzymes:  Lab  06/12/12 0810 06/12/12 0050 06/11/12 1750  CKTOTAL -- -- --  CKMB -- -- --  CKMBINDEX -- -- --  TROPONINI <0.30 <0.30 <0.30   BNP (last 3 results)  Basename 06/12/12 0800 06/11/12 1341  PROBNP 9941.0* 9693.0*    Studies: Dg Chest 2 View  06/13/2012  *RADIOLOGY REPORT*  Clinical Data: Follow up of shortness of breath and pneumonia.  CHEST - 2 VIEW  Comparison: 06/11/2012  Findings: Cardiomegaly accentuated by AP portable technique.  Small left pleural effusion.  Probable trace right pleural fluid. No pneumothorax.  Minimal worsening in the right greater than left interstitial and airspace opacities.  Somewhat more  confluent developing opacity at the right upper lobe.  IMPRESSION: Worsened aeration, especially on the right.  Favor congestive heart failure.  Developing infection of the right upper lobe and left lower lobe cannot be excluded.  Similar left greater than right bilateral pleural effusions.   Original Report Authenticated By: Jeronimo Greaves, M.D.     Scheduled Meds:    . amoxicillin-clavulanate  1 tablet Oral Q12H  . aspirin  81 mg Oral Daily  . atorvastatin  10 mg Oral q1800  . calcium carbonate  500 mg of elemental calcium Oral Q breakfast  . clopidogrel  75 mg Oral Q breakfast  . diltiazem  60 mg Oral QID  . feeding supplement  237 mL Oral BID BM  . ferrous sulfate  325 mg Oral Q breakfast  . furosemide  40 mg Oral Daily  . guaiFENesin  600 mg Oral BID  . heparin  5,000 Units Subcutaneous Q8H  . levalbuterol  0.63 mg Nebulization TID  . pantoprazole  40 mg Oral Q1200  . polycarbophil  625 mg Oral Daily  . polyvinyl alcohol  1 drop Both Eyes TID  . sodium chloride  3 mL Intravenous Q12H  . sodium chloride  3 mL Intravenous Q12H     Time spent: >30 minutes    Greg Eckrich  [facility] Pager [phone]. If 8PM-8AM, please contact night-coverage at [email or web address], password [facility] 06/14/2012, 9:05 PM  LOS: 3 days

## 2012-06-14 NOTE — Progress Notes (Signed)
Physical Therapy Treatment Patient Details Name: Sylvia Duncan MRN: [id number] DOB: Jan 22, 1913 Today's Date: 06/14/2012 Time: 4098-1191 PT Time Calculation (min): 24 min  PT Assessment / Plan / Recommendation Comments on Treatment Session  Pt s/p PNA with decr mobility secondary to decr endurance for activity and decr balance.  Will benefit from PT to address balance and endurance issues.  Agree with return to A living at d/c.      Follow Up Recommendations  Home health PT;Supervision - Intermittent                 Equipment Recommendations  None recommended by PT        Frequency Min 3X/week   Plan Discharge plan remains appropriate;Frequency remains appropriate    Precautions / Restrictions Precautions Precautions: Fall Restrictions Weight Bearing Restrictions: No   Pertinent Vitals/Pain VSS, No pain    Mobility  Bed Mobility Bed Mobility: Not assessed Transfers Transfers: Sit to Stand;Stand to Sit Sit to Stand: 4: Min guard;With upper extremity assist;With armrests;From chair/3-in-1 Stand to Sit: 4: Min guard;With upper extremity assist;With armrests;To chair/3-in-1 Details for Transfer Assistance: cues for hand placement and encouragement for ambulation. Ambulation/Gait Ambulation/Gait Assistance: 4: Min assist Ambulation Distance (Feet): 45 Feet Assistive device: Rolling walker Ambulation/Gait Assistance Details: Pt slow and guarded.  Would not go outside of room.  Asked to use the 3N1 on the way back to the chair.  Needed assist to pull down/up  Depends.  Pt ambulated well overall but needed steadying assist at times as pt with decr postural stability even with RW.  Gait Pattern: Decreased stride length;Narrow base of support Gait velocity: decreased Stairs: No Wheelchair Mobility Wheelchair Mobility: No    PT Goals Acute Rehab PT Goals PT Goal: Sit to Stand - Progress: Progressing toward goal PT Goal: Stand to Sit - Progress: Progressing toward goal PT  Goal: Stand - Progress: Progressing toward goal PT Goal: Ambulate - Progress: Progressing toward goal  Visit Information  Last PT Received On: 06/14/12 Assistance Needed: +1    Subjective Data  Subjective: "I need to use the bathroom on the way back to the chair."   Cognition  Overall Cognitive Status: History of cognitive impairments - at baseline Arousal/Alertness: Awake/alert Orientation Level: Appears intact for tasks assessed Behavior During Session: [facility] for tasks performed    Balance  Dynamic Standing Balance Dynamic Standing - Balance Support: Bilateral upper extremity supported;During functional activity Dynamic Standing - Level of Assistance: 4: Min assist Dynamic Standing - Balance Activities: Lateral lean/weight shifting;Forward lean/weight shifting Dynamic Standing - Comments: performing hygeine after toileting  End of Session PT - End of Session Equipment Utilized During Treatment: Gait belt;Oxygen Activity Tolerance: Patient limited by fatigue Patient left: in chair;with call bell/phone within reach;with chair alarm set Nurse Communication: Mobility status        INGOLD,Kawana Hegel 06/14/2012, 1:28 PM [facility] Acute Rehabilitation [phone] (pager)

## 2012-06-15 DIAGNOSIS — E119 Type 2 diabetes mellitus without complications: Secondary | ICD-10-CM

## 2012-06-15 MED ORDER — FUROSEMIDE 40 MG PO TABS: 40.0000 mg | ORAL_TABLET | Freq: Every day | ORAL | Status: DC

## 2012-06-15 MED ORDER — DILTIAZEM HCL 60 MG PO TABS: 60.0000 mg | ORAL_TABLET | Freq: Four times a day (QID) | ORAL | Status: DC

## 2012-06-15 MED ORDER — PANTOPRAZOLE SODIUM 40 MG PO TBEC: 40.0000 mg | DELAYED_RELEASE_TABLET | Freq: Every day | ORAL | Status: AC

## 2012-06-15 MED ORDER — AMOXICILLIN-POT CLAVULANATE 875-125 MG PO TABS: 1.0000 | ORAL_TABLET | Freq: Two times a day (BID) | ORAL | Status: DC

## 2012-06-15 MED ORDER — AMOXICILLIN-POT CLAVULANATE 875-125 MG PO TABS: 1.0000 | ORAL_TABLET | Freq: Two times a day (BID) | ORAL | Status: AC

## 2012-06-15 MED ORDER — PANTOPRAZOLE SODIUM 40 MG PO TBEC: 40.0000 mg | DELAYED_RELEASE_TABLET | Freq: Every day | ORAL | Status: DC

## 2012-06-15 NOTE — Discharge Summary (Signed)
Physician Discharge Summary  LIZVET CHUNN ZOX:[id number] DOB: May 18, 1913 DOA: 06/11/2012  PCP: Willow Ora, MD  Admit date: 06/11/2012 Discharge date: 06/15/2012  Time spent: >30 minutes  Recommendations for Outpatient Follow-up:  BMET to follow kidney function and electrolytes Reevaluate BP and atrial fibrillation, adjust medications as needed [facility] PT/OT arranged for deconditioning.  Discharge Diagnoses:  Active Problems:  CAP (community acquired pneumonia)  CHF (congestive heart failure)  HTN (hypertension)  GERD (gastroesophageal reflux disease) Physical Deconditioning HLD   Discharge Condition: stable and improved. Will be discharged to ALF. Follow up with PCP in 2 weeks. HH services arranged.  Diet recommendation: heart healthy  Filed Weights   06/13/12 0100 06/15/12 0507  Weight: 65.046 kg (143 lb 6.4 oz) 62.959 kg (138 lb 12.8 oz)    History of present illness:  79 year old female resident of [facility], with known history of HTN, s/p CVA right insula 12/2011, atrial fibrillation, not anticoagulation candidate, OA, osteopenia, Barrett's esophagus, chronic diarrhea, previous pancreatitis, s/p cholecystectomy, sent to the ED for cough, productive of brownish phlegm since 06/07/12 and shortness of breath, along with some palpitations. In the ED, she was found to be in rapid atrial fibrillation, and to have abnormal CXR. She was administered a single iv Bolus of Cardizem by ED MD, followed by continuous infusion. Patient denies fever, chills or chest pain   Hospital Course:  1-CAP: Culture negative up to date. Antibiotics transitioned to augmentin which she will continue for 5 more days to finish abx treatment. Will continue IS. Patient overall doing better and breathing continue improving; now no requiring oxygen supplementation. No fever, WBC's WNL. Per PT/OT will require [facility] services for deconditioning, but ok to return to ALF today.  2-atrial fibrillation: rate well controlled.  Will continue current medications dose.   3-Mild exacerbation of diastolic heart failure: patient 2-D echo with normal EF (55%) and no wall motion abnormalities. Will continue lasix current PO dose; patient will be also instructed to follow low sodium diet.   4-HTN: stable overall. Will monitor and continue current meds.   5-GERD: continue PPI   6-HLD: continue statins.   7-hypokalemia: repleted. BMET demonstrating K WNL.   8-Acute on chronic renal failure: patient with stage 2 CKD at baseline. Worse due to acute diuresis. Patient Cr now back to baseline.   9-Physical deconditioning: will arrange for [facility] PT/OT at discharge. Ok for patient to return to ALF.   Consultations: PT/OT  Discharge Exam: Filed Vitals:   06/14/12 1740 06/14/12 2024 06/15/12 0507 06/15/12 0817  BP: 114/79 106/75 97/66   Pulse:  90 89   Temp:  98.3 F (36.8 C) 97.9 F (36.6 C)   TempSrc:  Oral Oral   Resp:  15 15   Height:      Weight:   62.959 kg (138 lb 12.8 oz)   SpO2:  96% 97% 97%    General: NAD, good O2 sat on RA, denies CP or SOB Cardiovascular: irregular, no rubs or gallops Respiratory: good air movement, no wheezing Abdomen: soft, NT, no distended Neuro: non focal  Discharge Instructions  Discharge Orders    Future Orders Please Complete By Expires   Diet - low sodium heart healthy      Discharge instructions      Comments:   -take medications as prescribed -follow low sodium diet (less than 2 grams daily) -arrange follow up with PCP in 2 weeks -PT/OT at the facility -keep yourself well hydrated  Medication List     As of 06/15/2012 11:33 AM    TAKE these medications         acetaminophen 500 MG tablet   Commonly known as: TYLENOL   Take 1,000 mg by mouth every 6 (six) hours as needed. For pain      amoxicillin-clavulanate 875-125 MG per tablet   Commonly known as: AUGMENTIN   Take 1 tablet by mouth every 12 (twelve) hours.      aspirin 81 MG chewable tablet    Chew 1 tablet (81 mg total) by mouth daily.      atorvastatin 10 MG tablet   Commonly known as: LIPITOR   Take 1 tablet (10 mg total) by mouth daily at 6 PM.      calcium carbonate 600 MG Tabs   Commonly known as: OS-CAL   Take 600 mg by mouth daily.      clopidogrel 75 MG tablet   Commonly known as: PLAVIX   Take 1 tablet (75 mg total) by mouth daily with breakfast.      diltiazem 60 MG tablet   Commonly known as: CARDIZEM   Take 1 tablet (60 mg total) by mouth 4 (four) times daily.      feeding supplement Liqd   Take 237 mLs by mouth 2 (two) times daily between meals.      ferrous sulfate 325 (65 FE) MG tablet   Take 325 mg by mouth daily with breakfast.      Fiber 625 MG Tabs   Take by mouth daily.      furosemide 40 MG tablet   Commonly known as: LASIX   Take 1 tablet (40 mg total) by mouth daily.      NATURAL BALANCE OP   Place 2 drops into both eyes 3 (three) times daily.      pantoprazole 40 MG tablet   Commonly known as: PROTONIX   Take 1 tablet (40 mg total) by mouth daily at 12 noon.           Follow-up Information    Follow up with Willow Ora, MD. Schedule an appointment as soon as possible for a visit in 2 weeks.   Contact information:   [address] [facility] [address] [place] [address] [phone]           The results of significant diagnostics from this hospitalization (including imaging, microbiology, ancillary and laboratory) are listed below for reference.    Significant Diagnostic Studies: Dg Chest 2 View  06/13/2012  *RADIOLOGY REPORT*  Clinical Data: Follow up of shortness of breath and pneumonia.  CHEST - 2 VIEW  Comparison: 06/11/2012  Findings: Cardiomegaly accentuated by AP portable technique.  Small left pleural effusion.  Probable trace right pleural fluid. No pneumothorax.  Minimal worsening in the right greater than left interstitial and airspace opacities.  Somewhat more confluent developing opacity at the right  upper lobe.  IMPRESSION: Worsened aeration, especially on the right.  Favor congestive heart failure.  Developing infection of the right upper lobe and left lower lobe cannot be excluded.  Similar left greater than right bilateral pleural effusions.   Original Report Authenticated By: Jeronimo Greaves, M.D.    Dg Chest Portable 1 View  06/11/2012  *RADIOLOGY REPORT*  Clinical Data: Chest pain, shortness of breath.  PORTABLE CHEST - 1 VIEW  Comparison: 10/26/2011  Findings: Cardiomegaly.  Patchy bilateral interstitial and alveolar opacities, right greater than left.  This represent asymmetric edema (favored) or  infection.  Small bilateral pleural effusions. No acute bony abnormality.  IMPRESSION: Cardiomegaly.  Bilateral interstitial and alveolar opacities, right greater than left.  Favor asymmetric edema.  Infection cannot be completely excluded.  Recommend clinical correlation.  Small bilateral effusions.   Original Report Authenticated By: Charlett Nose, M.D.     Microbiology: Recent Results (from the past 240 hour(s))  URINE CULTURE     Status: Normal   Collection Time   06/13/12  1:50 AM      Component Value Range Status Comment   Specimen Description URINE, RANDOM   Final    Special Requests NONE   Final    Culture  Setup Time 06/13/2012 08:34   Final    Colony Count NO GROWTH   Final    Culture NO GROWTH   Final    Report Status 06/14/2012 FINAL   Final      Labs: Basic Metabolic Panel:  Lab 06/14/12 7846 06/13/12 0512 06/12/12 0800 06/11/12 1737 06/11/12 1339  NA 138 139 140 -- 139  K 4.1 5.0 2.9* -- 3.3*  CL 99 101 100 -- 101  CO2 32 26 29 -- 21  GLUCOSE 101* 123* 123* -- 129*  BUN 26* 37* 27* -- 33*  CREATININE 0.97 1.22* 0.97 0.95 1.01  CALCIUM 8.6 9.0 8.8 -- 8.8  MG -- 2.1 -- -- --  PHOS -- -- -- -- --   Liver Function Tests:  Lab 06/11/12 1339  AST 35  ALT 21  ALKPHOS 52  BILITOT 0.8  PROT 6.8  ALBUMIN 3.5   CBC:  Lab 06/13/12 0512 06/12/12 0800 06/11/12 1737  06/11/12 1339  WBC 9.4 7.1 5.9 6.5  NEUTROABS -- -- -- 5.3  HGB 11.8* 11.3* 12.5 12.0  HCT 35.3* 33.3* 36.9 35.8*  MCV 97.5 96.8 97.1 96.8  PLT 173 154 170 175   Cardiac Enzymes:  Lab 06/12/12 0810 06/12/12 0050 06/11/12 1750  CKTOTAL -- -- --  CKMB -- -- --  CKMBINDEX -- -- --  TROPONINI <0.30 <0.30 <0.30   BNP: BNP (last 3 results)  Basename 06/12/12 0800 06/11/12 1341  PROBNP 9941.0* 9693.0*     Signed:  Jesenia Spera  [facility] 06/15/2012, 11:33 AM

## 2012-06-15 NOTE — Clinical Social Work Note (Signed)
CSW was consulted to complete discharge of patient. Pt to transfer to [facility] [place] today via daughter. Family and patient are aware of d/c. D/C packet complete with chart copy, signed FL2.  CSW signing off as no other CSW needs identified at this time.  Lia Foyer, LCSWA [facility] Clinical Social Worker Contact #: [phone] (weekend)

## 2012-06-16 ENCOUNTER — Other Ambulatory Visit: Payer: Self-pay | Admitting: Internal Medicine

## 2012-06-18 ENCOUNTER — Telehealth: Payer: Self-pay | Admitting: Internal Medicine

## 2012-06-18 NOTE — Telephone Encounter (Signed)
Please call one of the pt's daughters   Corrie Dandy or Truett Perna   [phone]), see how pt is doing, arrange a f/u at her convenience. If unable to come to the office will help w/ orders, etc. Also,  she may benefit from a MD who could visit her @ [facility]

## 2012-06-19 NOTE — Telephone Encounter (Signed)
Spoke with pt's daughter sherill & she stated that the pt was feeling much better & is back at [facility]. She stated that it would be easier if an MD could visit her at [facility].

## 2012-06-21 ENCOUNTER — Encounter: Payer: Self-pay | Admitting: *Deleted

## 2012-06-24 ENCOUNTER — Encounter: Payer: Self-pay | Admitting: *Deleted

## 2012-06-24 NOTE — Telephone Encounter (Signed)
Spoke to [facility] & they do not have a MD that can see the pt. Would you like for the pt to come in for a follow-up visit? Please advise.

## 2012-06-24 NOTE — Telephone Encounter (Signed)
Pt has appt 1.28.14.

## 2012-06-24 NOTE — Telephone Encounter (Signed)
The hospital is recommended a followup in 2 weeks. Please arrange

## 2012-06-25 ENCOUNTER — Ambulatory Visit (INDEPENDENT_AMBULATORY_CARE_PROVIDER_SITE_OTHER): Payer: Medicare Other | Admitting: Internal Medicine

## 2012-06-25 ENCOUNTER — Ambulatory Visit (HOSPITAL_BASED_OUTPATIENT_CLINIC_OR_DEPARTMENT_OTHER)
Admission: RE | Admit: 2012-06-25 | Discharge: 2012-06-25 | Disposition: A | Discharge status: Home / Self Care | Payer: Medicare Other | Source: Ambulatory Visit | Attending: Internal Medicine | Admitting: Internal Medicine

## 2012-06-25 VITALS — BP 108/70 | HR 78 | Temp 97.4°F

## 2012-06-25 DIAGNOSIS — D509 Iron deficiency anemia, unspecified: Secondary | ICD-10-CM

## 2012-06-25 DIAGNOSIS — J189 Pneumonia, unspecified organism: Secondary | ICD-10-CM | POA: Insufficient documentation

## 2012-06-25 DIAGNOSIS — R059 Cough, unspecified: Secondary | ICD-10-CM | POA: Insufficient documentation

## 2012-06-25 DIAGNOSIS — I509 Heart failure, unspecified: Secondary | ICD-10-CM

## 2012-06-25 DIAGNOSIS — I4891 Unspecified atrial fibrillation: Secondary | ICD-10-CM

## 2012-06-25 DIAGNOSIS — M1990 Unspecified osteoarthritis, unspecified site: Secondary | ICD-10-CM

## 2012-06-25 DIAGNOSIS — R05 Cough: Secondary | ICD-10-CM | POA: Insufficient documentation

## 2012-06-25 DIAGNOSIS — E119 Type 2 diabetes mellitus without complications: Secondary | ICD-10-CM

## 2012-06-25 NOTE — Assessment & Plan Note (Addendum)
Status post admission to the hospital and  antibiotics, currently asymptomatic. Last chest x-ray is reported as follows:   Worsened aeration, especially on the right. Favor congestive heart  failure. Developing infection of the right upper lobe and left  lower lobe cannot be excluded.  Similar left greater than right bilateral pleural effusions.   Plan: Redo a chest x-ray Patient reports that he is working on getting her PT OT set up

## 2012-06-25 NOTE — Assessment & Plan Note (Signed)
According to the [facility] she's not taking Lasix,  Go back to Lasix, use to take 40 mg, rec  20 mg. Will get  a chest x-ray today, if she continue w/  pleural effusions, will consider increase dose to 40 mg again

## 2012-06-25 NOTE — Assessment & Plan Note (Signed)
Will check a hemoglobin A1c

## 2012-06-25 NOTE — Assessment & Plan Note (Signed)
Hemoglobin in the hospital slightly lower than before. Check a hemoglobin. Continue with iron

## 2012-06-25 NOTE — Assessment & Plan Note (Addendum)
Continue with left shoulder pain, refer again to orthopedic surgery

## 2012-06-25 NOTE — Patient Instructions (Addendum)
Get a chest XR at [facility], corner of [address] (10 minutes form here); they are open 24/7 [address]  [place], [place] [address] [phone] ---- Please restart Lasix 40 mg, half tablet daily. Restart Protonix

## 2012-06-25 NOTE — Progress Notes (Signed)
  Subjective:    Patient ID: Sylvia Duncan, female    DOB: Jul 09, 1912, 79 y.o.   MRN: [id number]  HPI Hospital followup Patient was admitted recently to the hospital with respiratory symptoms. She was found to have a community-acquired pneumonia, she was treated in the standard fashion. Also had Afib w/ rapid ventricular response, she was treated with Cardizem and eventually discharged on a well-controlled rate. She had mild CHF exacerbation, echo show no wall motion abnormalities, EF of 55. Had transient hypokalemia and also acute on chronic renal failure. Last creatinine 0.9, potassium 4.1, hemoglobin was 11.8, slightly less than baseline. Last chest x-ray:  Worsened aeration, especially on the right. Favor congestive heart  failure. Developing infection of the right upper lobe and left  lower lobe cannot be excluded.  Similar left greater than right bilateral pleural effusions.   Past Medical History  Diagnosis Date  . Hypertension   . Osteopenia   . Atrial fibrillation     not coumadin candidate  . Impaired gait   . Gait abnormality     imbalance  . Urinary incontinence   . Diarrhea     chronic  . Barrett's esophagus   . Osteoarthritis   . Edema leg     chronic, LEFT leg, u/s 2009 neg for DVT  . CVA (cerebral infarction) 01/26/2012   Past Surgical History  Procedure Date  . Cholecystectomy     had pancreatitis  . Hysterectomy-unknown    Social History:  widow , lives by self , has someone with her 24 h  Currently at [facility] , nursing home after a stroke 12-2011  2 daughter, in [place]; Corrie Dandy and [place] older one is POA [phone])  lost 2 children  Does not drive anymore  Tobacco-- never  ETOH-- no  Review of Systems Since she left the hospital she went back to ALF at [facility]. Denies further respiratory symptoms, specifically no cough, fever or chills. No chest pain. Occasionally get short of breath. Has occasional palpitations only at night. Appetite is slightly  decreased    Objective:   Physical Exam General -- alert, well-developed, no apparent emotional or physical distress.   Neck - unable to check for JVD, patient is in a wheelchair  Lungs -- normal respiratory effort, no intercostal retractions, no accessory muscle use, and decreased breath sounds.   Heart-- IRR rate in the 70s   Extremities-- no pretibial edema bilaterally Neurologic-- alert & oriented X3 and strength normal in all extremities. Psych--   not anxious appearing and not depressed appearing.       Assessment & Plan:

## 2012-06-25 NOTE — Assessment & Plan Note (Addendum)
Was on  RVR when admitted to the hospital, currently rate is well-controlled, she is still has occasional palpitations at night. Plan: No change for now, consider addition of a low dose of beta blockers however her blood pressures in the low side

## 2012-06-26 ENCOUNTER — Encounter: Payer: Self-pay | Admitting: Internal Medicine

## 2012-07-01 ENCOUNTER — Telehealth: Payer: Self-pay | Admitting: Internal Medicine

## 2012-07-01 NOTE — Telephone Encounter (Signed)
Results from 06/27/2011: Potassium 3.3, creatinine 1.0. Plan: Please call [facility] , recheck a BMP in one week from today

## 2012-07-02 NOTE — Telephone Encounter (Signed)
Faxed order to [facility].  AttnCorrie Dandy

## 2012-07-04 DIAGNOSIS — Z5189 Encounter for other specified aftercare: Secondary | ICD-10-CM

## 2012-07-04 DIAGNOSIS — J189 Pneumonia, unspecified organism: Secondary | ICD-10-CM

## 2012-07-04 DIAGNOSIS — M6281 Muscle weakness (generalized): Secondary | ICD-10-CM

## 2012-07-04 DIAGNOSIS — R262 Difficulty in walking, not elsewhere classified: Secondary | ICD-10-CM

## 2012-07-11 ENCOUNTER — Telehealth: Payer: Self-pay | Admitting: Internal Medicine

## 2012-07-11 NOTE — Telephone Encounter (Signed)
See last phone note, needs a BMP , call [facility]

## 2012-07-15 ENCOUNTER — Encounter: Payer: Self-pay | Admitting: Internal Medicine

## 2012-07-16 ENCOUNTER — Telehealth: Payer: Self-pay | Admitting: Internal Medicine

## 2012-07-16 NOTE — Telephone Encounter (Signed)
Advise patient, potassium is now normal. Creatinine is a slightly el evated, recommend to drink plenty of fluids, avoid Motrin or Motrin-like medicines except for aspirin. Will recheck creatinine when she comes back.

## 2012-07-16 NOTE — Telephone Encounter (Signed)
Pt had BMP, see phone note with results.

## 2012-07-16 NOTE — Telephone Encounter (Signed)
Discussed with pt's daughter

## 2012-07-23 ENCOUNTER — Encounter: Payer: Self-pay | Admitting: Internal Medicine

## 2012-07-23 ENCOUNTER — Ambulatory Visit (INDEPENDENT_AMBULATORY_CARE_PROVIDER_SITE_OTHER): Payer: Medicare Other | Admitting: Internal Medicine

## 2012-07-23 VITALS — BP 104/66 | HR 72 | Temp 97.9°F

## 2012-07-23 DIAGNOSIS — I509 Heart failure, unspecified: Secondary | ICD-10-CM

## 2012-07-23 DIAGNOSIS — M1990 Unspecified osteoarthritis, unspecified site: Secondary | ICD-10-CM

## 2012-07-23 DIAGNOSIS — I4891 Unspecified atrial fibrillation: Secondary | ICD-10-CM

## 2012-07-23 DIAGNOSIS — J189 Pneumonia, unspecified organism: Secondary | ICD-10-CM

## 2012-07-23 DIAGNOSIS — E119 Type 2 diabetes mellitus without complications: Secondary | ICD-10-CM

## 2012-07-23 LAB — CBC WITH DIFFERENTIAL/PLATELET
Basophils Relative: 0.4 % (ref 0.0–3.0)
Eosinophils Relative: 3.3 % (ref 0.0–5.0)
Hemoglobin: 14.3 g/dL (ref 12.0–15.0)
Lymphocytes Relative: 14.9 % (ref 12.0–46.0)
Monocytes Relative: 10 % (ref 3.0–12.0)
Neutro Abs: 4.9 10*3/uL (ref 1.4–7.7)
Neutrophils Relative %: 71.4 % (ref 43.0–77.0)
RBC: 4.37 Mil/uL (ref 3.87–5.11)
WBC: 6.9 10*3/uL (ref 4.5–10.5)

## 2012-07-23 LAB — BASIC METABOLIC PANEL
Calcium: 9 mg/dL (ref 8.4–10.5)
Chloride: 98 mEq/L (ref 96–112)
Creatinine, Ser: 1.4 mg/dL — ABNORMAL HIGH (ref 0.4–1.2)
Sodium: 140 mEq/L (ref 135–145)

## 2012-07-23 NOTE — Progress Notes (Signed)
  Subjective:    Patient ID: Sylvia Duncan, female    DOB: 10-20-1912, 79 y.o.   MRN: [id number]  HPI Here w/ Sherryl for a f/u In general feeling better, strength has increased to some extent. Able to mobilize better.  Past Medical History  Diagnosis Date  . Hypertension   . Osteopenia   . Atrial fibrillation     not coumadin candidate  . Impaired gait   . Gait abnormality     imbalance  . Urinary incontinence   . Diarrhea     chronic  . Barrett's esophagus   . Osteoarthritis   . Edema leg     chronic, LEFT leg, u/s 2009 neg for DVT  . CVA (cerebral infarction) 01/26/2012   Past Surgical History  Procedure Laterality Date  . Cholecystectomy      had pancreatitis  . Hysterectomy-unknown      Social History:  Widow, has someone with her 24 h  Currently at [facility] nursing home after a stroke 12-2011 , they help w/  meds  2 daughter, in [place]; Corrie Dandy and [place] older one is POA [phone])  lost 2 children  Does not drive anymore  Tobacco-- never  ETOH-- no   Review of Systems Appetite is satisfactory. No fever chills, no cough. No palpitations. Her medication list reviewed, her daughter is not sure about what she is taking because her medicines are managed by the staff at [facility] . Still has left shoulder pain    Objective:   Physical Exam General -- alert, well-developed  Lungs -- normal respiratory effort, no intercostal retractions, no accessory muscle use, and normal breath sounds.   Heart-- irreg Extremities-- no pretibial edema bilaterally Neurologic-- alert , cooperative. Psych--   not anxious appearing and not depressed appearing.      Assessment & Plan:

## 2012-07-23 NOTE — Assessment & Plan Note (Signed)
Check a A1C 

## 2012-07-23 NOTE — Patient Instructions (Addendum)
Continue with the same medications. Take one strawberry Ensure once a day Please take your blood sugars 3-4 times a week, call if they are more than 180 Next visit in 3-4 months

## 2012-07-23 NOTE — Assessment & Plan Note (Signed)
Asymptomatic, last chest x-ray showed resolution of infiltrates. Apparently not getting PT OT but she is regaining her strength.

## 2012-07-23 NOTE — Assessment & Plan Note (Addendum)
Ongoing shoulder  pain, will treat conservatively, discussed with   the patient's daughter.

## 2012-07-23 NOTE — Assessment & Plan Note (Addendum)
restarted Lasix at the last office visit, no evidence of vol overload. Check a BMP. Her weight has decreased to 133, pt concerned but  I belive is due to Lasix.

## 2012-07-23 NOTE — Assessment & Plan Note (Signed)
rate well-controlled, no change

## 2012-07-29 ENCOUNTER — Encounter: Payer: Self-pay | Admitting: Internal Medicine

## 2012-07-29 ENCOUNTER — Telehealth: Payer: Self-pay

## 2012-07-29 MED ORDER — HYDROCODONE-ACETAMINOPHEN 2.5-500 MG PO TABS: 1.0000 | ORAL_TABLET | Freq: Three times a day (TID) | ORAL | Status: DC | PRN

## 2012-07-29 NOTE — Telephone Encounter (Signed)
Recommend a low dose of Vicodin 2.5 mg 3 times a day by mouth as needed, will make her sleepy, needs to be closely monitor  for falls.

## 2012-07-29 NOTE — Telephone Encounter (Signed)
Discussed with lisa @ [facility] & faxed rx.

## 2012-07-29 NOTE — Telephone Encounter (Signed)
Misty Stanley from [facility] assisted [facility] call stating patient is having left side pain and has taken tylenol and would like to know if Dr.Paz would prescribe a stronger medication for pain. Patient vitals checked out good: B/P 128/82, Temp: 97.0, Pulse: 101, 90 % SPO2.  Patient denies chest pain  Last OV 07/23/12, Dr.Paz please advise

## 2012-07-31 ENCOUNTER — Telehealth: Payer: Self-pay

## 2012-07-31 MED ORDER — HYDROCODONE-ACETAMINOPHEN 2.5-325 MG PO TABS: 1.0000 | ORAL_TABLET | Freq: Three times a day (TID) | ORAL | Status: DC | PRN

## 2012-07-31 NOTE — Telephone Encounter (Signed)
Okay to change, same sig

## 2012-07-31 NOTE — Telephone Encounter (Signed)
Faxed rx to pharmacy  

## 2012-07-31 NOTE — Telephone Encounter (Signed)
Lisa called indicating RX for Vicodin for 2.5-500 is unavailable. Please rewrite rx for 2.5-325 and send to [facility].   Dr.Paz please advise

## 2012-08-09 ENCOUNTER — Telehealth: Payer: Self-pay | Admitting: *Deleted

## 2012-08-09 NOTE — Telephone Encounter (Signed)
Misty Stanley from [facility] called requesting Vicodin script be changed to 5/325 rather than the 2.5/325 they currently have. Call back number is [phone]. Okay to change?

## 2012-08-09 NOTE — Telephone Encounter (Signed)
Med updated and faxed form back to [facility].

## 2012-08-09 NOTE — Telephone Encounter (Signed)
Correction: vicodin 5-325 1/2 tab po tid prn

## 2012-08-19 ENCOUNTER — Telehealth: Payer: Self-pay | Admitting: *Deleted

## 2012-08-19 MED ORDER — HYDROCODONE-ACETAMINOPHEN 5-325 MG PO TABS: 0.5000 | ORAL_TABLET | Freq: Three times a day (TID) | ORAL | Status: AC | PRN

## 2012-08-19 MED ORDER — HYDROCODONE-ACETAMINOPHEN 5-325 MG PO TABS: 0.5000 | ORAL_TABLET | Freq: Three times a day (TID) | ORAL | Status: DC | PRN

## 2012-08-19 NOTE — Telephone Encounter (Signed)
Refill done.  

## 2012-08-19 NOTE — Addendum Note (Signed)
Addended by: Edwena Felty T on: 08/19/2012 02:01 PM   Modules accepted: Orders

## 2012-09-02 ENCOUNTER — Telehealth: Payer: Self-pay | Admitting: Internal Medicine

## 2012-09-02 NOTE — Telephone Encounter (Signed)
Due for BMP-- dx HTN Please arrange either here or at [facility]

## 2012-09-06 NOTE — Telephone Encounter (Signed)
Orders faxed to [facility].

## 2012-09-09 ENCOUNTER — Telehealth: Payer: Self-pay | Admitting: Internal Medicine

## 2012-09-09 NOTE — Telephone Encounter (Signed)
BMP from 08/30/2012: Potassium 3.4, creatinine 1.0. Call [facility]:  Kidney function better, continue with current medications (including Lasix 40 mg half tablet Monday Wednesday and Friday)

## 2012-09-10 NOTE — Telephone Encounter (Signed)
Faxed to [facility].

## 2012-12-09 ENCOUNTER — Telehealth: Payer: Self-pay | Admitting: Internal Medicine

## 2012-12-09 NOTE — Telephone Encounter (Signed)
Spoke with patient's daughter, Truett Perna. She is going to contact orthopedic MD. will call back to office if referral needed.

## 2012-12-09 NOTE — Telephone Encounter (Signed)
Daughter/Sherill Phone: [phone] called to ask if Sylvia Duncan could get another injection for chronic left shoulder pain waking her at night.  Been complaining about shoulder pain for past 3 months saying it  wakes her hourly from 0300 onward.  Using "Oman cream" without improvement.  Declined triage; Is not with Sylvia Duncan at time of call. Sylvia Duncan is resident at [facility]; stated staff would notify Sylvia Duncan if she was having any life threatening problems.  Appointment for injection can be scheduled for Monday, Tuesday or Thursday.  Please call back after 1230 to advise if MD approves anothrer injection and date/time of appointment.

## 2012-12-09 NOTE — Telephone Encounter (Signed)
To get a local injection in the shoulder, needs to be seen by orthopedic surgery, they can call the orthopedic office. If need a referral okay to enter.

## 2012-12-09 NOTE — Telephone Encounter (Signed)
Please advise. Thanks.  

## 2013-02-17 IMAGING — CR DG CHEST 2V
2 series · 2 of 2 positions shown · non-contrast
Comparison: 10/13/2011.

CLINICAL DATA: Shortness breath, cough and congestion.

CHEST - 2 VIEW

[view not recorded (1 of 2)]
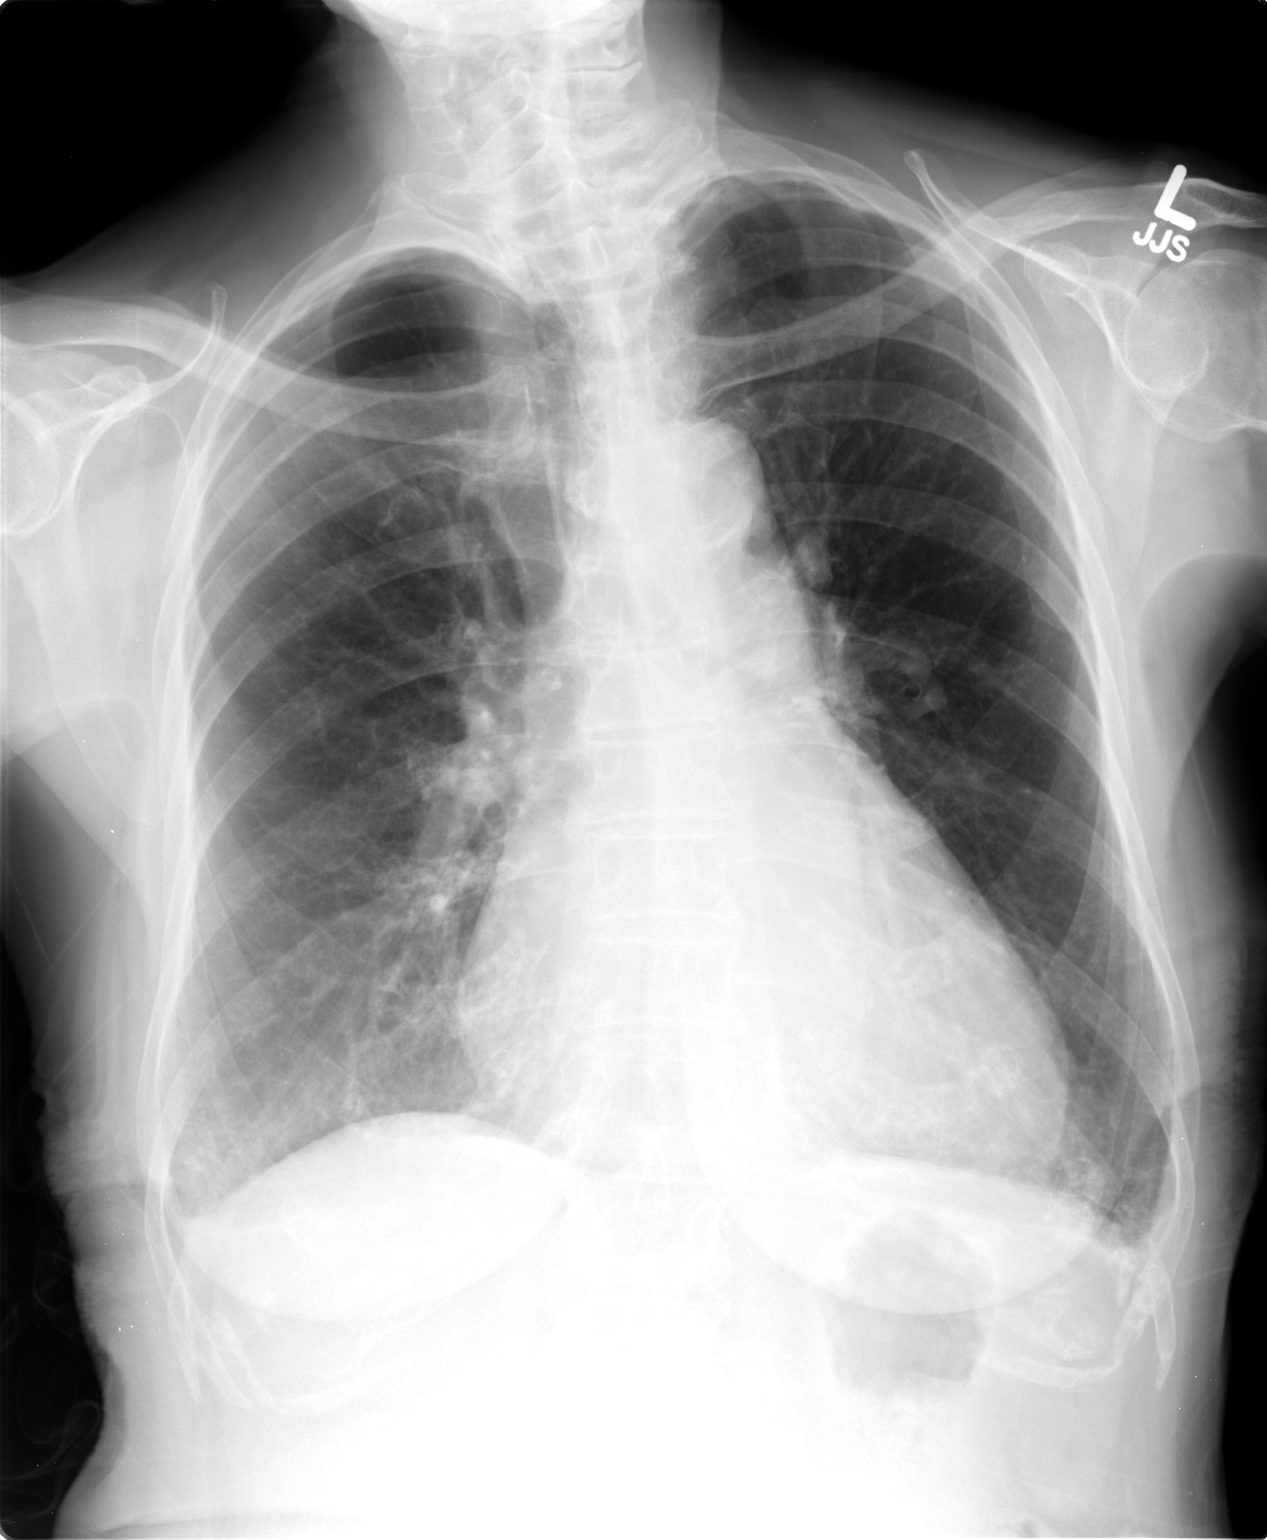

[view not recorded (2 of 2)]
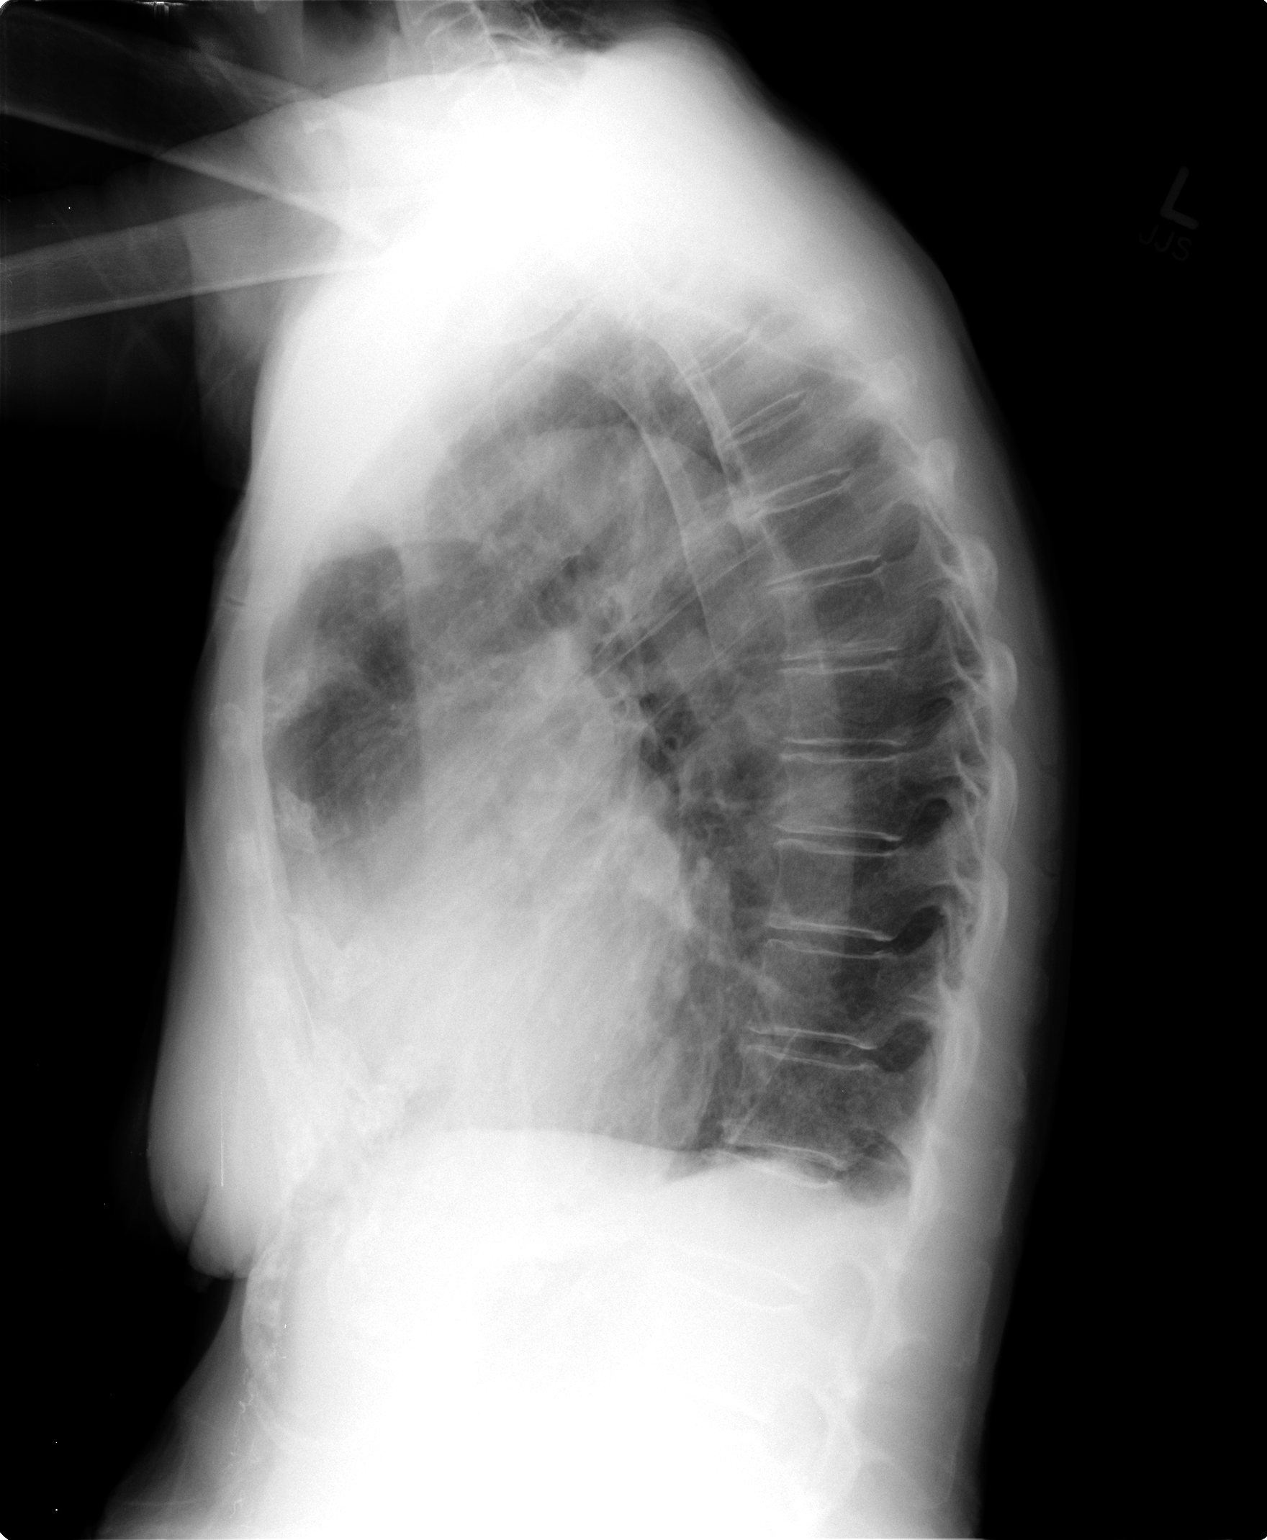

[2 of 2 positions shown; findings below may reference images not displayed]

FINDINGS: Trachea is midline.  Heart is enlarged, stable.  Thoracic
aorta is calcified.  Lungs are hyperinflated.  There may be minimal
linear scarring in the medial left lower lobe.  Blunting of the
costophrenic angles may be chronic.  Suspect scarring at the right
lung base.
IMPRESSION: Probable bibasilar pleural parenchymal scarring.  No acute
findings.

## 2013-03-20 ENCOUNTER — Telehealth: Payer: Self-pay | Admitting: Internal Medicine

## 2013-03-20 NOTE — Telephone Encounter (Signed)
pts daughter notified. Pt Scheduled for 03/31/13.

## 2013-03-20 NOTE — Telephone Encounter (Signed)
Please call the patient's daughter Christell Constant  [phone]: I am doing the paperwork for Sylvia Duncan, I like to see her before the next round of paperwork is due. If she is unable to come to the office I recommmend she sees a doctor at the facility she is at. Please let me know if I can help in any way, may be sending records.

## 2013-03-31 ENCOUNTER — Encounter: Payer: Self-pay | Admitting: Internal Medicine

## 2013-03-31 ENCOUNTER — Ambulatory Visit (INDEPENDENT_AMBULATORY_CARE_PROVIDER_SITE_OTHER): Payer: Medicare Other | Admitting: Internal Medicine

## 2013-03-31 VITALS — BP 118/72 | HR 98 | Temp 97.9°F

## 2013-03-31 DIAGNOSIS — I4891 Unspecified atrial fibrillation: Secondary | ICD-10-CM

## 2013-03-31 DIAGNOSIS — E119 Type 2 diabetes mellitus without complications: Secondary | ICD-10-CM

## 2013-03-31 DIAGNOSIS — I509 Heart failure, unspecified: Secondary | ICD-10-CM

## 2013-03-31 DIAGNOSIS — M1990 Unspecified osteoarthritis, unspecified site: Secondary | ICD-10-CM

## 2013-03-31 DIAGNOSIS — D509 Iron deficiency anemia, unspecified: Secondary | ICD-10-CM

## 2013-03-31 DIAGNOSIS — I10 Essential (primary) hypertension: Secondary | ICD-10-CM

## 2013-03-31 DIAGNOSIS — E785 Hyperlipidemia, unspecified: Secondary | ICD-10-CM

## 2013-03-31 NOTE — Progress Notes (Signed)
  Subjective:    Patient ID: Sylvia Duncan, female    DOB: [birth year], 79 y.o.   MRN: [id number]  HPI ROV, here w/ her daughter  Hypertension--good medication compliance, BP today is great DJD--continue with shoulder pain, mostly at night, symptoms relatively well control with Lidoderm patch. Medical history and med list reviewed, good compliance with medication. Memory issues--daughter reports mild memory issues, gradually more noticeable , mostly the patient is repeating the same questions.  Past Medical History  Diagnosis Date  . Hypertension   . Osteopenia   . Atrial fibrillation     not coumadin candidate  . Impaired gait   . Gait abnormality     imbalance  . Urinary incontinence   . Diarrhea     chronic  . Barrett's esophagus   . Osteoarthritis   . Edema leg     chronic, LEFT leg, u/s 2009 neg for DVT  . CVA (cerebral infarction) 01/26/2012   Past Surgical History  Procedure Laterality Date  . Cholecystectomy      had pancreatitis  . Hysterectomy-unknown     History   Social History  . Marital Status: Widowed    Spouse Name: N/A    Number of Children: 4  . Years of Education: N/A   Occupational History  . n/a     Social History Main Topics  . Smoking status: Never Smoker   . Smokeless tobacco: Never Used  . Alcohol Use: No  . Drug Use: No  . Sexual Activity: No   Other Topics Concern  . Not on file   Social History Narrative   Has 2 living children   Lives at [facility], gets all her meals and gets her meds   Willaim Rayas, daughters:    [place]  [phone] Salena Saner)   Link Snuffer [phone]               Review of Systems Pt's daughter reports Deshon is actually doing "very good". The patient reports no recent chest pain or SOB No nausea, vomiting, diarrhea. Denies suicidal thoughts but states "I am ready to go".     Objective:   Physical Exam BP 118/72  Pulse 98  Temp(Src) 97.9 F (36.6 C)  SpO2 94% General -- alert,  well-developed, NAD.  Lungs -- normal respiratory effort, no intercostal retractions, no accessory muscle use, and normal breath sounds.  Heart-- irreg , rate ~ 98 Abdomen-- Not distended,  soft, non-tender. Extremities-- no pretibial edema bilaterally  Neurologic--  alert & oriented X to time, place, self, knows why she is here Psych-- cooperative. No anxious appearing , no depressed appearing.      Assessment & Plan:

## 2013-03-31 NOTE — Patient Instructions (Signed)
Continue with same medications except Cardizem: Take 2 tablets in the morning, one tablet at 1 PM and 1 tablet at 8 PM  Check the  blood pressure once a  week be sure it is between 110/60 and 140/85. Ideal blood pressure is 120/80. If it is consistently higher or lower, let me know  Check your blood sugar once a week in the morning, call if blood sugar > 200  Next visit in 6 months

## 2013-03-31 NOTE — Assessment & Plan Note (Signed)
On Lipitor, check cholesterol (not fasting ) and check LFTs

## 2013-03-31 NOTE — Assessment & Plan Note (Signed)
Continue with shoulder pain, relatively well controlled with lidoderm  at night, encouraged the patient to ask for Tylenol as needed We talk about possibly referral to for shoulder injection but the patient and her daughter feel that is not necessary at this point

## 2013-03-31 NOTE — Assessment & Plan Note (Signed)
No vol overload  on exam, no change

## 2013-03-31 NOTE — Assessment & Plan Note (Addendum)
Rate slightly elevated, recommend to change Cardizem 60 mg from one pill 3 times a day to   -----> 2 in the morning, 1, 1

## 2013-03-31 NOTE — Assessment & Plan Note (Addendum)
Labs . Recommend to check her sugar once a week, to call if more than 200

## 2013-03-31 NOTE — Assessment & Plan Note (Signed)
Seems well-controlled, continue with present care, check a BMP 

## 2013-03-31 NOTE — Assessment & Plan Note (Addendum)
Check hemoglobin, iron and ferritin mostly to rule out overload with iron as  she takes supplements

## 2013-03-31 NOTE — Assessment & Plan Note (Signed)
Got a flu shot already

## 2013-05-15 ENCOUNTER — Telehealth: Payer: Self-pay | Admitting: Internal Medicine

## 2013-05-15 NOTE — Telephone Encounter (Signed)
Done. DJR  

## 2013-05-15 NOTE — Telephone Encounter (Signed)
Misty Stanley with [facility] is calling to request an order for a chest x-ray for the patient. She is having some chest congestion and they are having an issue with illness going around in her building. Please advise. [phone]

## 2013-05-15 NOTE — Telephone Encounter (Signed)
If she looks ill, vital signs unstable, high fever, chills, CP or  SOB ----> Go to the ER Otherwise okay to order chest x-ray -- dx cough

## 2013-06-02 ENCOUNTER — Telehealth: Payer: Self-pay | Admitting: Internal Medicine

## 2013-06-02 NOTE — Telephone Encounter (Addendum)
[facility] requested a x-ray several days ago, please call them and get the results.  also asked them how the patient is doing. Still coughing? Fever chills?

## 2013-06-03 NOTE — Telephone Encounter (Signed)
Nurse states will fax over cxr results also states that pt still has a little cough denies fever and chills overall ok.

## 2013-06-03 NOTE — Telephone Encounter (Signed)
Chest x-ray from 05/15/2013 showed mild to moderate bronchitis, small patchy area of interstitial pneumonitis or chronic interstitial fibrosis at the right lower lung. The patient is still coughing. Plan: Call [facility],  Prescribe  a Z-Pak. Patient needs to be seen if she is not improving

## 2013-06-03 NOTE — Telephone Encounter (Signed)
Done. DJR  

## 2013-07-30 ENCOUNTER — Telehealth: Payer: Self-pay | Admitting: *Deleted

## 2013-07-30 ENCOUNTER — Encounter: Payer: Self-pay | Admitting: *Deleted

## 2013-07-30 NOTE — Telephone Encounter (Signed)
Letter faxed to [facility] to continue Lidocaine patch. Awaiting response. JG//CMA

## 2013-09-09 ENCOUNTER — Ambulatory Visit (INDEPENDENT_AMBULATORY_CARE_PROVIDER_SITE_OTHER): Payer: Medicare Other | Admitting: Internal Medicine

## 2013-09-09 ENCOUNTER — Encounter: Payer: Self-pay | Admitting: Internal Medicine

## 2013-09-09 VITALS — BP 136/79 | HR 98 | Temp 99.1°F

## 2013-09-09 DIAGNOSIS — I4891 Unspecified atrial fibrillation: Secondary | ICD-10-CM

## 2013-09-09 DIAGNOSIS — E119 Type 2 diabetes mellitus without complications: Secondary | ICD-10-CM

## 2013-09-09 DIAGNOSIS — I10 Essential (primary) hypertension: Secondary | ICD-10-CM

## 2013-09-09 DIAGNOSIS — D509 Iron deficiency anemia, unspecified: Secondary | ICD-10-CM

## 2013-09-09 DIAGNOSIS — J189 Pneumonia, unspecified organism: Secondary | ICD-10-CM

## 2013-09-09 DIAGNOSIS — I509 Heart failure, unspecified: Secondary | ICD-10-CM

## 2013-09-09 NOTE — Assessment & Plan Note (Signed)
Labs

## 2013-09-09 NOTE — Progress Notes (Signed)
Pre visit review using our clinic review tool, if applicable. No additional management support is needed unless otherwise documented below in the visit note. 

## 2013-09-09 NOTE — Progress Notes (Signed)
Subjective:    Patient ID: Sylvia Duncan, female    DOB: 01-Mar-1913, 80 y.o.   MRN: [id number]  DOS:  09/09/2013 Type of  visit: Acute visit, here with her daughter Developed cough, no shortness of breath  and some chills for the last 2 days. Eyes are watery. They wonder if she has pneumonia. Meds list reviewed, good compliance, heart rate slt increased today ~ 98, no other recent HR readings are available.    ROS No fever, some runny nose. Mild nausea, no vomiting, diarrhea. Appetite has been decreased for a while. No wheezing or chest congestion. In general, she has declined gradually, sleeping more and her memory is slightly worse according to the patient's daughter  Past Medical History  Diagnosis Date  . Hypertension   . Osteopenia   . Atrial fibrillation     not coumadin candidate  . Impaired gait   . Gait abnormality     imbalance  . Urinary incontinence   . Diarrhea     chronic  . Barrett's esophagus   . Osteoarthritis   . Edema leg     chronic, LEFT leg, u/s 2009 neg for DVT  . CVA (cerebral infarction) 01/26/2012    Past Surgical History  Procedure Laterality Date  . Cholecystectomy      had pancreatitis  . Hysterectomy-unknown      History   Social History  . Marital Status: Widowed    Spouse Name: N/A    Number of Children: 4  . Years of Education: N/A   Occupational History  . n/a     Social History Main Topics  . Smoking status: Never Smoker   . Smokeless tobacco: Never Used  . Alcohol Use: No  . Drug Use: No  . Sexual Activity: No   Other Topics Concern  . Not on file   Social History Narrative   Has 2 living children   Lives at [facility], gets all her meals and gets her meds   Willaim RayasSherril Haney, daughters:    [place]  [phone]  [phone] Idelle Leech(C)   Mary Witcher [phone]                   Medication List       This list is accurate as of: 09/09/13  2:48 PM.  Always use your most recent med list.               acetaminophen 500 MG tablet  Commonly known as:  TYLENOL  Take 1,000 mg by mouth every 6 (six) hours as needed. For pain     ASPIR-81 PO  Take 81 mg by mouth daily.     atorvastatin 10 MG tablet  Commonly known as:  LIPITOR  Take 1 tablet (10 mg total) by mouth daily at 6 PM.     calcium carbonate 600 MG Tabs tablet  Commonly known as:  OS-CAL  Take 600 mg by mouth daily.     clopidogrel 75 MG tablet  Commonly known as:  PLAVIX  Take 75 mg by mouth daily.     diltiazem 60 MG tablet  Commonly known as:  CARDIZEM  Take by mouth. 2 tablets in the morning, one tablet at 1 PM, 1 tablet at 8 PM.     ENSURE PLUS Liqd  Take 1 Can by mouth daily at 6 (six) AM.     ferrous sulfate 325 (65 FE) MG tablet  Take 325 mg by mouth daily with breakfast.  Fiber Caps  Take 1 capsule by mouth daily.     furosemide 40 MG tablet  Commonly known as:  LASIX  Take 20 mg by mouth daily. Half tablet daily     glucose blood test strip  1 each by Other route as needed for other. Use as instructed     HYDROcodone-acetaminophen 5-325 MG per tablet  Commonly known as:  NORCO/VICODIN  Take 0.5 tablets by mouth 3 (three) times daily as needed for pain.     LIDODERM 5 %  Generic drug:  lidocaine  Place 1 patch onto the skin daily. Remove & Discard patch within 12 hours or as directed by MD     NATURAL BALANCE OP  Place 2 drops into both eyes 3 (three) times daily.     pantoprazole 40 MG tablet  Commonly known as:  PROTONIX  Take 1 tablet (40 mg total) by mouth daily at 12 noon.           Objective:   Physical Exam BP 136/79  Pulse 98  Temp(Src) 99.1 F (37.3 C)  SpO2 92% General --elderly lady in no distress, nontoxic-appearing, pleasant and cooperative  HEENT-- Not pale. Eyes or watery, nose is slightly congested Lungs -- normal respiratory effort, no intercostal retractions, no accessory muscle use, and normal breath sounds.  Heart-- irreg.  Abdomen-- Not distended, good bowel  sounds,soft, non-tender Extremities-- no pretibial edema bilaterally  Neurologic--  alert & oriented to self, place, month and year. Speech normal  Psych--  No anxious or depressed appearing.      Assessment & Plan:

## 2013-09-09 NOTE — Assessment & Plan Note (Addendum)
on Cardizem 60 mg 4 tablets a day. Rate well controlled? Despite a slightly elevated HR, she has been doing well for a while, no vol overload, recommend no change for now

## 2013-09-09 NOTE — Assessment & Plan Note (Signed)
Seems stable On home O2

## 2013-09-09 NOTE — Patient Instructions (Signed)
Get your blood work before you leave   Get the XR at [facility], corner [address] (10 minutes form here); they are open 24/7 [address]  [place], [place] [address] [phone]  Will call with results, if you have high fever, increased symptoms: Go to the ER.

## 2013-09-09 NOTE — Assessment & Plan Note (Signed)
Well controlled, labs  

## 2013-09-09 NOTE — Assessment & Plan Note (Addendum)
Increasecough, low-grade temperature, chills. Pneumonia? CBC chest x-ray, further advice w/ result . Consider inpatient treatment if pneumonia is documented.  patient's daughter preferred the x-ray to be done at the facility she lives. They will fax me the results

## 2013-09-10 ENCOUNTER — Telehealth: Payer: Self-pay | Admitting: Internal Medicine

## 2013-09-10 ENCOUNTER — Inpatient Hospital Stay (HOSPITAL_COMMUNITY)
Admission: EM | Admit: 2013-09-10 | Discharge: 2013-09-13 | DRG: 193 | Disposition: A | Discharge status: Skilled Nursing Facility | Payer: Medicare Other | Attending: Internal Medicine | Admitting: Internal Medicine

## 2013-09-10 ENCOUNTER — Emergency Department (HOSPITAL_COMMUNITY): Payer: Medicare Other

## 2013-09-10 ENCOUNTER — Encounter (HOSPITAL_COMMUNITY): Payer: Self-pay | Admitting: Emergency Medicine

## 2013-09-10 DIAGNOSIS — D509 Iron deficiency anemia, unspecified: Secondary | ICD-10-CM

## 2013-09-10 DIAGNOSIS — I639 Cerebral infarction, unspecified: Secondary | ICD-10-CM

## 2013-09-10 DIAGNOSIS — H532 Diplopia: Secondary | ICD-10-CM

## 2013-09-10 DIAGNOSIS — R32 Unspecified urinary incontinence: Secondary | ICD-10-CM

## 2013-09-10 DIAGNOSIS — E119 Type 2 diabetes mellitus without complications: Secondary | ICD-10-CM

## 2013-09-10 DIAGNOSIS — Z66 Do not resuscitate: Secondary | ICD-10-CM | POA: Diagnosis present

## 2013-09-10 DIAGNOSIS — M949 Disorder of cartilage, unspecified: Secondary | ICD-10-CM

## 2013-09-10 DIAGNOSIS — N815 Vaginal enterocele: Secondary | ICD-10-CM

## 2013-09-10 DIAGNOSIS — E785 Hyperlipidemia, unspecified: Secondary | ICD-10-CM

## 2013-09-10 DIAGNOSIS — G43009 Migraine without aura, not intractable, without status migrainosus: Secondary | ICD-10-CM

## 2013-09-10 DIAGNOSIS — R0902 Hypoxemia: Secondary | ICD-10-CM

## 2013-09-10 DIAGNOSIS — J154 Pneumonia due to other streptococci: Principal | ICD-10-CM | POA: Diagnosis present

## 2013-09-10 DIAGNOSIS — A630 Anogenital (venereal) warts: Secondary | ICD-10-CM

## 2013-09-10 DIAGNOSIS — R600 Localized edema: Secondary | ICD-10-CM

## 2013-09-10 DIAGNOSIS — M899 Disorder of bone, unspecified: Secondary | ICD-10-CM

## 2013-09-10 DIAGNOSIS — G9340 Encephalopathy, unspecified: Secondary | ICD-10-CM

## 2013-09-10 DIAGNOSIS — Z7982 Long term (current) use of aspirin: Secondary | ICD-10-CM

## 2013-09-10 DIAGNOSIS — J189 Pneumonia, unspecified organism: Secondary | ICD-10-CM | POA: Diagnosis present

## 2013-09-10 DIAGNOSIS — H35019 Changes in retinal vascular appearance, unspecified eye: Secondary | ICD-10-CM

## 2013-09-10 DIAGNOSIS — M1990 Unspecified osteoarthritis, unspecified site: Secondary | ICD-10-CM | POA: Diagnosis present

## 2013-09-10 DIAGNOSIS — R269 Unspecified abnormalities of gait and mobility: Secondary | ICD-10-CM

## 2013-09-10 DIAGNOSIS — R197 Diarrhea, unspecified: Secondary | ICD-10-CM | POA: Diagnosis present

## 2013-09-10 DIAGNOSIS — I10 Essential (primary) hypertension: Secondary | ICD-10-CM | POA: Diagnosis present

## 2013-09-10 DIAGNOSIS — I509 Heart failure, unspecified: Secondary | ICD-10-CM | POA: Diagnosis present

## 2013-09-10 DIAGNOSIS — K2270 Barrett's esophagus without dysplasia: Secondary | ICD-10-CM

## 2013-09-10 DIAGNOSIS — J9600 Acute respiratory failure, unspecified whether with hypoxia or hypercapnia: Secondary | ICD-10-CM | POA: Diagnosis present

## 2013-09-10 DIAGNOSIS — R1310 Dysphagia, unspecified: Secondary | ICD-10-CM | POA: Diagnosis present

## 2013-09-10 DIAGNOSIS — I4891 Unspecified atrial fibrillation: Secondary | ICD-10-CM

## 2013-09-10 DIAGNOSIS — Z8673 Personal history of transient ischemic attack (TIA), and cerebral infarction without residual deficits: Secondary | ICD-10-CM

## 2013-09-10 DIAGNOSIS — K219 Gastro-esophageal reflux disease without esophagitis: Secondary | ICD-10-CM

## 2013-09-10 DIAGNOSIS — J9601 Acute respiratory failure with hypoxia: Secondary | ICD-10-CM | POA: Diagnosis present

## 2013-09-10 HISTORY — DX: Pneumonia, unspecified organism: J18.9

## 2013-09-10 HISTORY — DX: Headache: R51

## 2013-09-10 HISTORY — DX: Gastro-esophageal reflux disease without esophagitis: K21.9

## 2013-09-10 HISTORY — DX: Migraine, unspecified, not intractable, without status migrainosus: G43.909

## 2013-09-10 HISTORY — DX: Unspecified chronic bronchitis: J42

## 2013-09-10 HISTORY — DX: Unspecified osteoarthritis, unspecified site: M19.90

## 2013-09-10 LAB — COMPREHENSIVE METABOLIC PANEL
ALBUMIN: 3.2 g/dL — AB (ref 3.5–5.2)
ALK PHOS: 65 U/L (ref 39–117)
ALT: 16 U/L (ref 0–35)
ALT: 15 U/L (ref 0–35)
AST: 29 U/L (ref 0–37)
AST: 24 U/L (ref 0–37)
Albumin: 3.7 g/dL (ref 3.5–5.2)
Alkaline Phosphatase: 66 U/L (ref 39–117)
BILIRUBIN TOTAL: 1.2 mg/dL (ref 0.3–1.2)
BUN: 15 mg/dL (ref 6–23)
BUN: 19 mg/dL (ref 6–23)
CHLORIDE: 100 meq/L (ref 96–112)
CO2: 27 meq/L (ref 19–32)
CO2: 28 mEq/L (ref 19–32)
Calcium: 9.3 mg/dL (ref 8.4–10.5)
Calcium: 9.2 mg/dL (ref 8.4–10.5)
Chloride: 98 mEq/L (ref 96–112)
Creatinine, Ser: 0.9 mg/dL (ref 0.4–1.2)
Creatinine, Ser: 0.85 mg/dL (ref 0.50–1.10)
GFR calc Af Amer: 64 mL/min — ABNORMAL LOW (ref >90)
GFR calc non Af Amer: 55 mL/min — ABNORMAL LOW (ref >90)
GFR: 62.99 mL/min (ref >60.00)
Glucose, Bld: 129 mg/dL — ABNORMAL HIGH (ref 70–99)
Glucose, Bld: 138 mg/dL — ABNORMAL HIGH (ref 70–99)
POTASSIUM: 3.7 meq/L (ref 3.7–5.3)
Potassium: 3.7 mEq/L (ref 3.5–5.1)
SODIUM: 139 meq/L (ref 137–147)
Sodium: 137 mEq/L (ref 135–145)
TOTAL PROTEIN: 7.6 g/dL (ref 6.0–8.3)
Total Bilirubin: 0.8 mg/dL (ref 0.3–1.2)
Total Protein: 8.1 g/dL (ref 6.0–8.3)

## 2013-09-10 LAB — URINALYSIS, ROUTINE W REFLEX MICROSCOPIC
Bilirubin Urine: NEGATIVE
Glucose, UA: NEGATIVE mg/dL
Hgb urine dipstick: NEGATIVE
KETONES UR: NEGATIVE mg/dL
Leukocytes, UA: NEGATIVE
NITRITE: NEGATIVE
PH: 6 (ref 5.0–8.0)
Protein, ur: 30 mg/dL — AB
Specific Gravity, Urine: 1.018 (ref 1.005–1.030)
Urobilinogen, UA: 1 mg/dL (ref 0.0–1.0)

## 2013-09-10 LAB — CBC WITH DIFFERENTIAL/PLATELET
BASOS PCT: 0 % (ref 0–1)
Basophils Absolute: 0 10*3/uL (ref 0.0–0.1)
Eosinophils Absolute: 0 10*3/uL (ref 0.0–0.7)
Eosinophils Relative: 0 % (ref 0–5)
HCT: 36.5 % (ref 36.0–46.0)
Hemoglobin: 12.3 g/dL (ref 12.0–15.0)
Lymphocytes Relative: 6 % — ABNORMAL LOW (ref 12–46)
Lymphs Abs: 0.9 10*3/uL (ref 0.7–4.0)
MCH: 32.5 pg (ref 26.0–34.0)
MCHC: 33.7 g/dL (ref 30.0–36.0)
MCV: 96.3 fL (ref 78.0–100.0)
Monocytes Absolute: 1.4 10*3/uL — ABNORMAL HIGH (ref 0.1–1.0)
Monocytes Relative: 9 % (ref 3–12)
NEUTROS PCT: 85 % — AB (ref 43–77)
Neutro Abs: 12.4 10*3/uL — ABNORMAL HIGH (ref 1.7–7.7)
PLATELETS: 189 10*3/uL (ref 150–400)
RBC: 3.79 MIL/uL — AB (ref 3.87–5.11)
RDW: 14.8 % (ref 11.5–15.5)
WBC: 14.6 10*3/uL — ABNORMAL HIGH (ref 4.0–10.5)

## 2013-09-10 LAB — TSH: TSH: 1.08 u[IU]/mL (ref 0.35–5.50)

## 2013-09-10 LAB — MRSA PCR SCREENING: MRSA BY PCR: NEGATIVE

## 2013-09-10 LAB — URINE MICROSCOPIC-ADD ON

## 2013-09-10 LAB — TROPONIN I: Troponin I: <0.3 ng/mL (ref <0.30)

## 2013-09-10 LAB — PRO B NATRIURETIC PEPTIDE: Pro B Natriuretic peptide (BNP): 10558 pg/mL — ABNORMAL HIGH (ref 0–450)

## 2013-09-10 LAB — STREP PNEUMONIAE URINARY ANTIGEN: STREP PNEUMO URINARY ANTIGEN: NEGATIVE

## 2013-09-10 MED ORDER — ENSURE PLUS PO LIQD: 1.0000 | Freq: Every day | ORAL | Status: DC

## 2013-09-10 MED ORDER — DILTIAZEM HCL 60 MG PO TABS: 60.0000 mg | ORAL_TABLET | Freq: Three times a day (TID) | ORAL | Status: DC

## 2013-09-10 MED ORDER — ASPIRIN EC 81 MG PO TBEC
81.0000 mg | DELAYED_RELEASE_TABLET | Freq: Every day | ORAL | Status: DC
Start: 2013-09-11 — End: 2013-09-13
  Administered 2013-09-11 – 2013-09-13 (×3): 81 mg via ORAL
  Filled 2013-09-10 (×3): qty 1

## 2013-09-10 MED ORDER — DILTIAZEM HCL 60 MG PO TABS
60.0000 mg | ORAL_TABLET | ORAL | Status: DC
  Administered 2013-09-10 – 2013-09-12 (×6): 60 mg via ORAL
  Filled 2013-09-10 (×8): qty 1

## 2013-09-10 MED ORDER — DEXTROSE 5 % IV SOLN
2.0000 g | INTRAVENOUS | Status: DC
  Administered 2013-09-10: 2 g via INTRAVENOUS
  Filled 2013-09-10 (×2): qty 2

## 2013-09-10 MED ORDER — PANTOPRAZOLE SODIUM 40 MG PO TBEC
40.0000 mg | DELAYED_RELEASE_TABLET | Freq: Every day | ORAL | Status: DC
  Administered 2013-09-11 – 2013-09-13 (×3): 40 mg via ORAL
  Filled 2013-09-10 (×2): qty 1

## 2013-09-10 MED ORDER — DILTIAZEM HCL 60 MG PO TABS
120.0000 mg | ORAL_TABLET | Freq: Every morning | ORAL | Status: DC
  Administered 2013-09-11 – 2013-09-13 (×3): 120 mg via ORAL
  Filled 2013-09-10 (×3): qty 2

## 2013-09-10 MED ORDER — ACETAMINOPHEN 325 MG PO TABS
650.0000 mg | ORAL_TABLET | Freq: Once | ORAL | Status: AC
Start: 2013-09-10 — End: 2013-09-10
  Administered 2013-09-10: 650 mg via ORAL
  Filled 2013-09-10: qty 2

## 2013-09-10 MED ORDER — ENSURE COMPLETE PO LIQD
237.0000 mL | Freq: Every day | ORAL | Status: DC
  Administered 2013-09-11: 237 mL via ORAL

## 2013-09-10 MED ORDER — FERROUS SULFATE 325 (65 FE) MG PO TABS
325.0000 mg | ORAL_TABLET | Freq: Every day | ORAL | Status: DC
  Administered 2013-09-11 – 2013-09-13 (×3): 325 mg via ORAL
  Filled 2013-09-10 (×4): qty 1

## 2013-09-10 MED ORDER — ENOXAPARIN SODIUM 40 MG/0.4ML ~~LOC~~ SOLN
40.0000 mg | SUBCUTANEOUS | Status: DC
  Administered 2013-09-10 – 2013-09-12 (×3): 40 mg via SUBCUTANEOUS
  Filled 2013-09-10 (×4): qty 0.4

## 2013-09-10 MED ORDER — DEXTROSE 5 % IV SOLN: 2.0000 g | Freq: Once | INTRAVENOUS | Status: DC

## 2013-09-10 MED ORDER — VANCOMYCIN HCL IN DEXTROSE 1-5 GM/200ML-% IV SOLN: 1000.0000 mg | Freq: Once | INTRAVENOUS | Status: DC

## 2013-09-10 MED ORDER — CLOPIDOGREL BISULFATE 75 MG PO TABS
75.0000 mg | ORAL_TABLET | Freq: Every day | ORAL | Status: DC
  Administered 2013-09-11 – 2013-09-13 (×3): 75 mg via ORAL
  Filled 2013-09-10 (×3): qty 1

## 2013-09-10 MED ORDER — LIDOCAINE 5 % EX PTCH
1.0000 | MEDICATED_PATCH | CUTANEOUS | Status: DC
  Administered 2013-09-10 – 2013-09-12 (×3): 1 via TRANSDERMAL
  Filled 2013-09-10 (×4): qty 1

## 2013-09-10 MED ORDER — ASPIRIN 81 MG PO TBEC: 81.0000 mg | DELAYED_RELEASE_TABLET | Freq: Every day | ORAL | Status: DC

## 2013-09-10 MED ORDER — CIPROFLOXACIN HCL 0.3 % OP SOLN
1.0000 [drp] | OPHTHALMIC | Status: DC
  Administered 2013-09-10 – 2013-09-13 (×13): 1 [drp] via OPHTHALMIC
  Filled 2013-09-10: qty 2.5

## 2013-09-10 MED ORDER — ATORVASTATIN CALCIUM 10 MG PO TABS
10.0000 mg | ORAL_TABLET | Freq: Every day | ORAL | Status: DC
  Administered 2013-09-10 – 2013-09-12 (×3): 10 mg via ORAL
  Filled 2013-09-10 (×4): qty 1

## 2013-09-10 MED ORDER — VANCOMYCIN HCL IN DEXTROSE 1-5 GM/200ML-% IV SOLN
1000.0000 mg | INTRAVENOUS | Status: DC
  Administered 2013-09-10 – 2013-09-11 (×2): 1000 mg via INTRAVENOUS
  Filled 2013-09-10 (×2): qty 200

## 2013-09-10 MED ORDER — FUROSEMIDE 10 MG/ML IJ SOLN
20.0000 mg | Freq: Every day | INTRAMUSCULAR | Status: DC
  Administered 2013-09-10: 20 mg via INTRAVENOUS
  Filled 2013-09-10: qty 2

## 2013-09-10 MED ORDER — HYDROCODONE-ACETAMINOPHEN 5-325 MG PO TABS
0.5000 | ORAL_TABLET | Freq: Three times a day (TID) | ORAL | Status: DC | PRN
Start: 2013-09-10 — End: 2013-09-13
  Administered 2013-09-11 – 2013-09-13 (×2): 0.5 via ORAL
  Filled 2013-09-10 (×2): qty 1

## 2013-09-10 NOTE — Telephone Encounter (Signed)
Misty StanleyLisa from [facility] called to inform dr Drue [place]. Her oxygen level is 85%,B/P-118/72,Temp-96.4,Pulse-125-130, and Resp 24. Patient is not eating and not feeling well at all. Please advise.

## 2013-09-10 NOTE — ED Provider Notes (Signed)
CSN: [id number]     Arrival date & time 09/10/13  1209 History   First MD Initiated Contact with Patient 09/10/13 1215     Chief Complaint  Patient presents with  . Shortness of Breath     (Consider location/radiation/quality/duration/timing/severity/associated sxs/prior Treatment) Patient is a 80 y.o. female presenting with shortness of breath. The history is provided by the patient.  Shortness of Breath Severity:  Mild Onset quality:  Unable to specify Duration: unable to specify. Timing:  Constant Progression:  Worsening Chronicity:  New Context comment:  At rest Relieved by:  Nothing Worsened by:  Nothing tried Ineffective treatments:  None tried Associated symptoms: cough   Associated symptoms: no abdominal pain, no chest pain, no fever, no headaches, no neck pain and no vomiting     Past Medical History  Diagnosis Date  . Hypertension   . Osteopenia   . Atrial fibrillation     not coumadin candidate  . Impaired gait   . Gait abnormality     imbalance  . Urinary incontinence   . Diarrhea     chronic  . Barrett's esophagus   . Osteoarthritis   . Edema leg     chronic, LEFT leg, u/s 2009 neg for DVT  . CVA (cerebral infarction) 01/26/2012   Past Surgical History  Procedure Laterality Date  . Cholecystectomy      had pancreatitis  . Hysterectomy-unknown     No family history on file. History  Substance Use Topics  . Smoking status: Never Smoker   . Smokeless tobacco: Never Used  . Alcohol Use: No   OB History   Grav Para Term Preterm Abortions TAB SAB Ect Mult Living                 Review of Systems  Constitutional: Negative for fever and fatigue.  HENT: Negative for congestion and drooling.   Eyes: Negative for pain.  Respiratory: Positive for cough and shortness of breath.   Cardiovascular: Negative for chest pain.  Gastrointestinal: Negative for nausea, vomiting, abdominal pain and diarrhea.  Genitourinary: Negative for dysuria and hematuria.   Musculoskeletal: Negative for back pain, gait problem and neck pain.  Skin: Negative for color change.  Neurological: Negative for dizziness and headaches.  Hematological: Negative for adenopathy.  Psychiatric/Behavioral: Negative for behavioral problems.  All other systems reviewed and are negative.     Allergies  Review of patient's allergies indicates no known allergies.  Home Medications   Prior to Admission medications   Medication Sig Start Date End Date Taking? Authorizing Provider  acetaminophen (TYLENOL) 500 MG tablet Take 1,000 mg by mouth every 6 (six) hours as needed. For pain    Historical Provider, MD  Artificial Tear Solution (NATURAL BALANCE OP) Place 2 drops into both eyes 3 (three) times daily.    Historical Provider, MD  Aspirin (ASPIR-81 PO) Take 81 mg by mouth daily.    Historical Provider, MD  atorvastatin (LIPITOR) 10 MG tablet Take 1 tablet (10 mg total) by mouth daily at 6 PM. 01/29/12 09/09/13  Ripudeep K Rai, MD  calcium carbonate (OS-CAL) 600 MG TABS Take 600 mg by mouth daily.    Historical Provider, MD  clopidogrel (PLAVIX) 75 MG tablet Take 75 mg by mouth daily.    Historical Provider, MD  diltiazem (CARDIZEM) 60 MG tablet Take by mouth. 2 tablets in the morning, one tablet at 1 PM, 1 tablet at 8 PM. 06/15/12 09/09/13  Vassie Lollarlos Madera, MD  ENSURE PLUS (ENSURE  PLUS) LIQD Take 1 Can by mouth daily at 6 (six) AM.    Historical Provider, MD  ferrous sulfate 325 (65 FE) MG tablet Take 325 mg by mouth daily with breakfast.    Historical Provider, MD  Fiber CAPS Take 1 capsule by mouth daily.    Historical Provider, MD  furosemide (LASIX) 40 MG tablet Take 20 mg by mouth daily. Half tablet daily 06/15/12   Vassie Loll, MD  glucose blood test strip 1 each by Other route as needed for other. Use as instructed    Historical Provider, MD  HYDROcodone-acetaminophen (NORCO/VICODIN) 5-325 MG per tablet Take 0.5 tablets by mouth 3 (three) times daily as needed for pain.  08/19/12   [name], MD  lidocaine (LIDODERM) 5 % Place 1 patch onto the skin daily. Remove & Discard patch within 12 hours or as directed by MD    Historical Provider, MD  pantoprazole (PROTONIX) 40 MG tablet Take 1 tablet (40 mg total) by mouth daily at 12 noon. 06/15/12   Vassie Loll, MD   There were no vitals taken for this visit. Physical Exam  Nursing note and vitals reviewed. Constitutional: She is oriented to person, place, and time. She appears well-developed and well-nourished.  HENT:  Head: Normocephalic.  Mouth/Throat: No oropharyngeal exudate.  Mild green mucoid discharge noted in the medial canthus of bilateral eyes.  Dry oral mucous membranes.  Eyes: Conjunctivae and EOM are normal. Pupils are equal, round, and reactive to light.  Neck: Normal range of motion. Neck supple.  Cardiovascular: Normal rate, regular rhythm, normal heart sounds and intact distal pulses.  Exam reveals no gallop and no friction rub.   No murmur heard. Pulmonary/Chest: Effort normal and breath sounds normal. No respiratory distress. She has no wheezes.  Abdominal: Soft. Bowel sounds are normal. There is no tenderness. There is no rebound and no guarding.  Musculoskeletal: Normal range of motion. She exhibits no edema and no tenderness.  Neurological: She is alert and oriented to person, place, and time.  Skin: Skin is warm and dry.  Psychiatric: She has a normal mood and affect. Her behavior is normal.    ED Course  Procedures (including critical care time) Labs Review Labs Reviewed  CBC WITH DIFFERENTIAL - Abnormal; Notable for the following:    WBC 14.6 (*)    RBC 3.79 (*)    Neutrophils Relative % 85 (*)    Neutro Abs 12.4 (*)    Lymphocytes Relative 6 (*)    Monocytes Absolute 1.4 (*)    All other components within normal limits  COMPREHENSIVE METABOLIC PANEL - Abnormal; Notable for the following:    Glucose, Bld 138 (*)    Albumin 3.2 (*)    GFR calc non Af Amer 55 (*)    GFR  calc Af Amer 64 (*)    All other components within normal limits  PRO B NATRIURETIC PEPTIDE - Abnormal; Notable for the following:    Pro B Natriuretic peptide (BNP) 10558.0 (*)    All other components within normal limits  URINALYSIS, ROUTINE W REFLEX MICROSCOPIC - Abnormal; Notable for the following:    Protein, ur 30 (*)    All other components within normal limits  CBC - Abnormal; Notable for the following:    WBC 17.4 (*)    RBC 3.58 (*)    Hemoglobin 11.5 (*)    HCT 34.4 (*)    All other components within normal limits  BASIC METABOLIC PANEL - Abnormal;  Notable for the following:    Potassium 3.3 (*)    Chloride 94 (*)    Glucose, Bld 126 (*)    GFR calc non Af Amer 54 (*)    GFR calc Af Amer 62 (*)    All other components within normal limits  CULTURE, BLOOD (ROUTINE X 2)  CULTURE, BLOOD (ROUTINE X 2)  MRSA PCR SCREENING  URINE CULTURE  CULTURE, EXPECTORATED SPUTUM-ASSESSMENT  GRAM STAIN  TROPONIN I  URINE MICROSCOPIC-ADD ON  HIV ANTIBODY (ROUTINE TESTING)  STREP PNEUMONIAE URINARY ANTIGEN  LEGIONELLA ANTIGEN, URINE    Imaging Review Dg Chest 2 View  09/10/2013   CLINICAL DATA:  Shortness of breath weakness.  EXAM: CHEST  2 VIEW  COMPARISON:  06/25/2012, 06/13/2012 and 06/11/2012  FINDINGS: Lungs are adequately inflated demonstrate focal consolidation over the posterior right upper lobe as well as opacification in the lung bases. Likely small amount a left pleural fluid. Stable moderate cardiomegaly. Calcified plaque is present over the aortic arch. Remainder of the exam is unchanged.  IMPRESSION: Multifocal airspace process predominately involving the posterior segment right upper lobe with additional involvement of the lung bases. Findings suggest multifocal pneumonia. Possible small left pleural effusion. Note that the right upper lobe airspace process is recurrent as consider chest CT on elective basis 3-4 weeks posttreatment for further evaluation to exclude  endobronchial abnormality.   Electronically Signed   By: Elberta Fortisaniel  Boyle M.D.   On: 09/10/2013 13:18     EKG Interpretation   Date/Time:  Wednesday September 10 2013 12:18:09 EDT Ventricular Rate:  91 PR Interval:    QRS Duration: 148 QT Interval:  369 QTC Calculation: 454 R Axis:   -36 Text Interpretation:  Atrial fibrillation Ventricular premature complex  IVCD, consider atypical RBBB Anteroseptal infarct, age indeterminate  Baseline wander in lead(s) II aVF Now with RBBB, no other significant  change Confirmed by Makyle Eslick  MD, Kaytie Ratcliffe ([id number]) on 09/10/2013 1:09:51 PM      MDM   Final diagnoses:  HCAP (healthcare-associated pneumonia)  Hypoxia    12:27 PM 80 y.o. female who presents with shortness of breath from [facility]. Per EMS report the patient was found to have O2 saturation of 85% on room air this morning. She was complaining of shortness of breath but denies any pain. This is consistent with her exam here. She states that she has had a cough but denies any sputum. She is afebrile and hypoxic to 80% on room air here. She has no other complaints. She is alert and oriented x3 on exam. Will get screening labwork.  Will admit to hospitalist for HCAP. Ordered Vanc/Cefepime.     Junius ArgyleForrest S Kendelle Schweers, MD 09/11/13 1101

## 2013-09-10 NOTE — H&P (Signed)
[facility] History and Physical  Sylvia Memssther B Martorano ZOX:[id number] DOB: [birth year] DOA: 09/10/2013  Referring physician: DR Romeo AppleHARRISON PCP: Willow OraJose Paz, MD   Chief Complaint: sent in from NH for hypoxia  HPI: Sylvia Duncan is a 80 y.o. female With prior h/o atrial fibrillation not on anticoagulation , hypertension, residing in the SNF was brought in for sob,. She was seen at PCP office and was ordered CXR and some labs, before she could get them done today, she was brought in to ED for hypoxia and fever. On arrival to ED, she underwent CXR revealing multi focal pneumonia. She is able to provide only minimal history and there is no family at bedside. She denies nausea, vomiting or diarrhea. Her labs reveal leukocytosis and elevated pro bnp. Her EKG shows chronic atrial fibrillation with rate control. She was then referred to medical service for admission for management of health care associated pneumonia.   Review of Systems:  See HPI otherwise negative.   Past Medical History  Diagnosis Date  . Hypertension   . Osteopenia   . Atrial fibrillation     not coumadin candidate  . Impaired gait   . Gait abnormality     imbalance  . Urinary incontinence   . Diarrhea     chronic  . Barrett's esophagus   . Osteoarthritis   . Edema leg     chronic, LEFT leg, u/s 2009 neg for DVT  . CVA (cerebral infarction) 01/26/2012   Past Surgical History  Procedure Laterality Date  . Cholecystectomy      had pancreatitis  . Hysterectomy-unknown     Social History:  reports that she has never smoked. She has never used smokeless tobacco. She reports that she does not drink alcohol or use illicit drugs.  No Known Allergies  No family history on file.   Prior to Admission medications   Medication Sig Start Date End Date Taking? Authorizing Provider  acetaminophen (TYLENOL) 500 MG tablet Take 1,000 mg by mouth every 6 (six) hours as needed. For pain   Yes Historical Provider, MD  Artificial  Tear Solution (NATURAL BALANCE OP) Place 2 drops into both eyes 3 (three) times daily.   Yes Historical Provider, MD  Aspirin (ASPIR-81 PO) Take 81 mg by mouth daily.   Yes Historical Provider, MD  atorvastatin (LIPITOR) 10 MG tablet Take 1 tablet (10 mg total) by mouth daily at 6 PM. 01/29/12  Yes Ripudeep K Rai, MD  calcium carbonate (OS-CAL) 600 MG TABS Take 600 mg by mouth daily.   Yes Historical Provider, MD  clopidogrel (PLAVIX) 75 MG tablet Take 75 mg by mouth daily.   Yes Historical Provider, MD  diltiazem (CARDIZEM) 60 MG tablet Take 60-120 mg by mouth 3 (three) times daily. Take 2 tablets in AM, 1 tablet in Afternoon and 1 tablet in PM 06/15/12  Yes Vassie Lollarlos Madera, MD  ENSURE PLUS (ENSURE PLUS) LIQD Take 1 Can by mouth daily at 6 (six) AM.   Yes Historical Provider, MD  ferrous sulfate 325 (65 FE) MG tablet Take 325 mg by mouth daily with breakfast.   Yes Historical Provider, MD  Fiber CAPS Take 1 capsule by mouth daily.   Yes Historical Provider, MD  furosemide (LASIX) 40 MG tablet Take 20 mg by mouth every Monday, Wednesday, and Friday.  06/15/12  Yes Vassie Lollarlos Madera, MD  glucose blood test strip 1 each by Other route as needed for other. Use as instructed   Yes Historical Provider, MD  HYDROcodone-acetaminophen (NORCO/VICODIN) 5-325 MG per tablet Take 0.5 tablets by mouth 3 (three) times daily as needed for pain. 08/19/12  Yes [name], MD  lidocaine (LIDODERM) 5 % Place 1 patch onto the skin daily. Remove & Discard patch within 12 hours or as directed by MD   Yes Historical Provider, MD  pantoprazole (PROTONIX) 40 MG tablet Take 1 tablet (40 mg total) by mouth daily at 12 noon. 06/15/12  Yes Vassie Loll, MD   Physical Exam: Filed Vitals:   09/10/13 1530  BP: 115/74  Pulse: 101  Temp: 98.9 F (37.2 C)  Resp: 22    BP 115/74  Pulse 101  Temp(Src) 98.9 F (37.2 C) (Oral)  Resp 22  Ht 5\' 6"  (1.676 m)  Wt 67.268 kg (148 lb 4.8 oz)  BMI 23.95 kg/m2  SpO2 98%  General:  Appears  calm and comfortable Eyes: PERRL, purulent discharge at the canthus of the eyes.  Neck: no LAD, masses or thyromegaly Cardiovascular: RRR, no m/r/g. No LE edema. Telemetry: SR, no arrhythmias  Respiratory: scattered rhonchi.  Abdomen: soft, ntnd Skin: no rash or induration seen on limited exam Musculoskeletal: grossly normal tone BUE/BLE Neurologic: able to move all extremities and she is oriented to place person.           Labs on Admission:  Basic Metabolic Panel:  Recent Labs Lab 09/09/13 1459 09/10/13 1237  NA 137 139  K 3.7 3.7  CL 100 98  CO2 27 28  GLUCOSE 129* 138*  BUN 15 19  CREATININE 0.9 0.85  CALCIUM 9.3 9.2   Liver Function Tests:  Recent Labs Lab 09/09/13 1459 09/10/13 1237  AST 29 24  ALT 16 15  ALKPHOS 66 65  BILITOT 1.2 0.8  PROT 8.1 7.6  ALBUMIN 3.7 3.2*   No results found for this basename: LIPASE, AMYLASE,  in the last 168 hours No results found for this basename: AMMONIA,  in the last 168 hours CBC:  Recent Labs Lab 09/10/13 1237  WBC 14.6*  NEUTROABS 12.4*  HGB 12.3  HCT 36.5  MCV 96.3  PLT 189   Cardiac Enzymes:  Recent Labs Lab 09/10/13 1237  TROPONINI <0.30    BNP (last 3 results)  Recent Labs  09/10/13 1237  PROBNP 10558.0*   CBG: No results found for this basename: GLUCAP,  in the last 168 hours  Radiological Exams on Admission: Dg Chest 2 View  09/10/2013   CLINICAL DATA:  Shortness of breath weakness.  EXAM: CHEST  2 VIEW  COMPARISON:  06/25/2012, 06/13/2012 and 06/11/2012  FINDINGS: Lungs are adequately inflated demonstrate focal consolidation over the posterior right upper lobe as well as opacification in the lung bases. Likely small amount a left pleural fluid. Stable moderate cardiomegaly. Calcified plaque is present over the aortic arch. Remainder of the exam is unchanged.  IMPRESSION: Multifocal airspace process predominately involving the posterior segment right upper lobe with additional involvement of  the lung bases. Findings suggest multifocal pneumonia. Possible small left pleural effusion. Note that the right upper lobe airspace process is recurrent as consider chest CT on elective basis 3-4 weeks posttreatment for further evaluation to exclude endobronchial abnormality.   Electronically Signed   By: Elberta Fortis M.D.   On: 09/10/2013 13:18    EKG: atrial fibrillation 98/min  Assessment/Plan Active Problems:   HYPERTENSION   ATRIAL FIBRILLATION, PAROXYSMAL   DYSPHAGIA UNSPECIFIED   CHF (congestive heart failure)   HCAP (healthcare-associated pneumonia)  Acute respiratory failure with  hypoxia from Health Care associated Pneumonia: - admit to telemetry - started her on IV vancomycin and IV cefepime.  - blood and sputum cultures ordered - urine for strep pneumonia and legionella antigen ordered - nasal canula oxygen as needed.  - swallow eval for dysphagia.    2. Atrial fibrillation: - chronic and rate controlled.  - resume diltiazem   3. Lethargic but oriented : - possibly from the  pneumonia.   4. Mild CHF: - elevated probnp.  - started her on low dose lasix.  - CXR in 2 to 3days to evaluate for resolving edema.  - daily weights and intake and output.   5. DVT prophylaxis.     Code Status: DNR  Family Communication: discussed with daughters at bedside Disposition Plan: possibly back to SNF when stable.   Time spent: 65 min  Kathlen ModyVijaya Jaden Abreu [facility] Pager [phone]

## 2013-09-10 NOTE — ED Notes (Signed)
Per EMS - pt is coming from [facility]. Nursing home called out for O2 sats of 85% on room air, placed pt on 2 liters/min 94%. Pt c/o sob, denies pain. sts she has been tired but doesn't know when it started. Pt takes hydrocodone for chronic pain. Staff reports she is acting per normal self. BP 115/73, HR 88 irregular, hx of a.fib. Not on blood thinners. CBG 143. Pt has green mucus around bilateral eyes.

## 2013-09-10 NOTE — ED Notes (Signed)
Phlebotomy at bedside.

## 2013-09-10 NOTE — ED Notes (Signed)
Phlebotomy aware of need for blood cultures.

## 2013-09-10 NOTE — ED Notes (Signed)
Pt returned from radiology.

## 2013-09-10 NOTE — Progress Notes (Signed)
ANTIBIOTIC CONSULT NOTE - INITIAL NOTE Pharmacy Consult for Vancomycin and Maxepime Indication: pneumonia  No Known Allergies  Patient Measurements: Height: 5\' 6"  (167.6 cm) Weight: 148 lb 4.8 oz (67.268 kg) (bed) IBW/kg (Calculated) : 59.3 Adjusted Body Weight:   Vital Signs: Temp: 98.9 F (37.2 C) (04/15 1530) Temp src: Oral (04/15 1530) BP: 115/74 mmHg (04/15 1530) Pulse Rate: 101 (04/15 1530) Intake/Output from previous day:   Intake/Output from this shift:    Labs:  Recent Labs  09/09/13 1459 09/10/13 1237  WBC  --  14.6*  HGB  --  12.3  PLT  --  189  CREATININE 0.9 0.85   Estimated Creatinine Clearance: 34.6 ml/min (by C-G formula based on Cr of 0.85). No results found for this basename: VANCOTROUGH, Leodis BinetVANCOPEAK, VANCORANDOM, GENTTROUGH, GENTPEAK, GENTRANDOM, TOBRATROUGH, TOBRAPEAK, TOBRARND, AMIKACINPEAK, AMIKACINTROU, AMIKACIN,  in the last 72 hours   Microbiology: Recent Results (from the past 720 hour(s))  MRSA PCR SCREENING     Status: None   Collection Time    09/10/13  3:32 PM      Result Value Ref Range Status   MRSA by PCR NEGATIVE  NEGATIVE Final   Comment:            The GeneXpert MRSA Assay (FDA     approved for NASAL specimens     only), is one component of a     comprehensive MRSA colonization     surveillance program. It is not     intended to diagnose MRSA     infection nor to guide or     monitor treatment for     MRSA infections.    Medical History: Past Medical History  Diagnosis Date  . Hypertension   . Osteopenia   . Atrial fibrillation     not coumadin candidate  . Impaired gait   . Gait abnormality     imbalance  . Urinary incontinence   . Diarrhea     chronic  . Barrett's esophagus   . Osteoarthritis   . Edema leg     chronic, LEFT leg, u/s 2009 neg for DVT  . CVA (cerebral infarction) 01/26/2012    Medications:  Scheduled:  . [START ON 09/11/2013] aspirin EC  81 mg Oral Daily  . atorvastatin  10 mg Oral q1800   . ceFEPime (MAXIPIME) IV  2 g Intravenous Once  . ceFEPime (MAXIPIME) IV  2 g Intravenous Q24H  . ciprofloxacin  1 drop Both Eyes Q4H while awake  . [START ON 09/11/2013] clopidogrel  75 mg Oral Daily  . [START ON 09/11/2013] diltiazem  120 mg Oral q morning - 10a  . diltiazem  60 mg Oral 2 times per day  . enoxaparin (LOVENOX) injection  40 mg Subcutaneous Q24H  . [START ON 09/11/2013] feeding supplement (ENSURE COMPLETE)  237 mL Oral Daily  . [START ON 09/11/2013] ferrous sulfate  325 mg Oral Q breakfast  . furosemide  20 mg Intravenous Daily  . lidocaine  1 patch Transdermal Q24H  . [START ON 09/11/2013] pantoprazole  40 mg Oral Q1200  . vancomycin  1,000 mg Intravenous Once  . vancomycin  1,000 mg Intravenous Q24H   Assessment: 80 yr old female brought in from SNF with cough and chills. Family worries she has pneumonia. MD ordered cefepime and Vancomycin in the ED, then per RX protocol. The ED doses were not charted and the receiving nurse on the floor was not given a report that the antibiotics has been  given in the ED. Will start now for CrCl of 34 ml/min.  Goal of Therapy:  Vancomycin trough level 15-20 mcg/ml  Plan:  Adjusting dose for renal function, will order cefepime 2 Gm IV q24hr and Vancomycin 1 Gm IV q24 hr. Vanc trough level when appropriate.  Lourdes SledgeBarbara Sue Dhriti Fales 09/10/2013,6:19 PM

## 2013-09-10 NOTE — Telephone Encounter (Signed)
Printed letter with orders, faxed to LevasyLisa at [phone]

## 2013-09-10 NOTE — Telephone Encounter (Signed)
Labs and x-rays are not back, recommend ER now.

## 2013-09-10 NOTE — Telephone Encounter (Signed)
Spoke with Misty StanleyLisa who states the patient is pallor and general malaise. Confirm vitals as below. Please advise.

## 2013-09-11 DIAGNOSIS — J9600 Acute respiratory failure, unspecified whether with hypoxia or hypercapnia: Secondary | ICD-10-CM

## 2013-09-11 DIAGNOSIS — G9340 Encephalopathy, unspecified: Secondary | ICD-10-CM

## 2013-09-11 DIAGNOSIS — J9601 Acute respiratory failure with hypoxia: Secondary | ICD-10-CM | POA: Diagnosis present

## 2013-09-11 LAB — URINE CULTURE
COLONY COUNT: NO GROWTH
CULTURE: NO GROWTH

## 2013-09-11 LAB — CBC
HCT: 34.4 % — ABNORMAL LOW (ref 36.0–46.0)
HEMOGLOBIN: 11.5 g/dL — AB (ref 12.0–15.0)
MCH: 32.1 pg (ref 26.0–34.0)
MCHC: 33.4 g/dL (ref 30.0–36.0)
MCV: 96.1 fL (ref 78.0–100.0)
PLATELETS: 192 10*3/uL (ref 150–400)
RBC: 3.58 MIL/uL — ABNORMAL LOW (ref 3.87–5.11)
RDW: 14.7 % (ref 11.5–15.5)
WBC: 17.4 10*3/uL — ABNORMAL HIGH (ref 4.0–10.5)

## 2013-09-11 LAB — BASIC METABOLIC PANEL
BUN: 17 mg/dL (ref 6–23)
CO2: 27 mEq/L (ref 19–32)
Calcium: 8.8 mg/dL (ref 8.4–10.5)
Chloride: 94 mEq/L — ABNORMAL LOW (ref 96–112)
Creatinine, Ser: 0.87 mg/dL (ref 0.50–1.10)
GFR calc non Af Amer: 54 mL/min — ABNORMAL LOW (ref >90)
GFR, EST AFRICAN AMERICAN: 62 mL/min — AB (ref >90)
Glucose, Bld: 126 mg/dL — ABNORMAL HIGH (ref 70–99)
POTASSIUM: 3.3 meq/L — AB (ref 3.7–5.3)
SODIUM: 138 meq/L (ref 137–147)

## 2013-09-11 LAB — HIV ANTIBODY (ROUTINE TESTING W REFLEX): HIV 1&2 Ab, 4th Generation: NONREACTIVE

## 2013-09-11 MED ORDER — ENSURE COMPLETE PO LIQD
237.0000 mL | ORAL | Status: DC
  Administered 2013-09-11 – 2013-09-12 (×2): 237 mL via ORAL

## 2013-09-11 MED ORDER — DEXTROSE 5 % IV SOLN
1.0000 g | INTRAVENOUS | Status: DC
  Administered 2013-09-11: 1 g via INTRAVENOUS
  Filled 2013-09-11: qty 1

## 2013-09-11 MED ORDER — POTASSIUM CHLORIDE CRYS ER 20 MEQ PO TBCR
40.0000 meq | EXTENDED_RELEASE_TABLET | Freq: Two times a day (BID) | ORAL | Status: AC
  Administered 2013-09-11 (×2): 40 meq via ORAL
  Filled 2013-09-11 (×2): qty 2

## 2013-09-11 MED ORDER — ENSURE PUDDING PO PUDG
1.0000 | ORAL | Status: DC
  Administered 2013-09-12 – 2013-09-13 (×2): 1 via ORAL

## 2013-09-11 MED ORDER — RESOURCE THICKENUP CLEAR PO POWD
ORAL | Status: DC | PRN
  Filled 2013-09-11: qty 125

## 2013-09-11 NOTE — Care Management Note (Addendum)
  Page 1 of 1   09/11/2013     2:40:12 PM   CARE MANAGEMENT NOTE 09/11/2013  Patient:  Sylvia Duncan,Sylvia Duncan   Account Number:  [id number]  Date Initiated:  09/11/2013  Documentation initiated by:  Edana Aguado  Subjective/Objective Assessment:   Admitted with HCAP     Action/Plan:   CM to follow for disposition needs   Anticipated DC Date:  09/14/2013   Anticipated DC Plan:  SKILLED NURSING FACILITY  In-house referral  Clinical Social Worker         Choice offered to / List presented to:             Status of service:  In process, will continue to follow Medicare Important Message given?   (If response is "NO", the following Medicare IM given date fields will be blank) Date Medicare IM given:   Date Additional Medicare IM given:    Discharge Disposition:    Per UR Regulation:  Reviewed for med. necessity/level of care/duration of stay  If discussed at Long Length of Stay Meetings, dates discussed:    Comments:  09/11/2013 Acute respiratory failure with hypoxia due to  HCAP (healthcare-associated pneumonia):  IV vancomycin and IV cefepime started on 4.15.2015.. blood and sputum cultures pending. Social:  Patient from SNF/[facility] Dispositon Plan:  Return to SNF - SW/Sylvia Duncan aware Sylvia Slape RN, BSN, Sylvia Duncan, CCM 09/11/2013

## 2013-09-11 NOTE — Evaluation (Signed)
Physical Therapy Evaluation Patient Details Name: Sylvia Duncan Jessie MRN: [id number] DOB: [birth year] Today's Date: 09/11/2013   History of Present Illness  Pt is a 80 y/o female living at an assisted living facility, admitted s/p acute respiratory failure with HCAP.  Clinical Impression  Pt admitted with the above. Pt currently with functional limitations due to the deficits listed below (see PT Problem List). At the time of PT eval, pt required increased assist to perform transfers and functional mobility. Daughter states this is a decline in function and was previously able to ambulate up to 10 feet at a time. Pt will benefit from skilled PT to increase their independence and safety with mobility to allow discharge to the venue listed below.       Follow Up Recommendations SNF;Supervision/Assistance - 24 hour    Equipment Recommendations  None recommended by PT    Recommendations for Other Services       Precautions / Restrictions Precautions Precautions: Fall Restrictions Weight Bearing Restrictions: No      Mobility  Bed Mobility Overal bed mobility: Needs Assistance Bed Mobility: Supine to Sit;Sit to Supine     Supine to sit: Mod assist Sit to supine: Min assist   General bed mobility comments: Pt able to advance LE's to/from EOB but requires assist for trunk support to complete transfers. [facility] trendelenberg and assist with bed pad.   Transfers Overall transfer level: Needs assistance Equipment used: Rolling walker (2 wheeled) Transfers: Sit to/from Stand Sit to Stand: Max assist         General transfer comment: VC's for hand placement on seated surface for safety. Pt with x2 trials and on second attempt was able to power-up to full standing.   Ambulation/Gait                Stairs            Wheelchair Mobility    Modified Rankin (Stroke Patients Only)       Balance Overall balance assessment: Needs  assistance Sitting-balance support: Feet supported;Bilateral upper extremity supported Sitting balance-Leahy Scale: Fair Sitting balance - Comments: Pt sat EOB x10 minutes unsupported.    Standing balance support: Bilateral upper extremity supported Standing balance-Leahy Scale: Poor                               Pertinent Vitals/Pain Pt reports dizziness with sitting EOB and attempting standing. Pulse-ox would not read on pt (RN states this has been typical) and pt was returned to a supine position. Pt did not appear SOB throughout session.     Home Living Family/patient expects to be discharged to:: Assisted living               Home Equipment: Dan HumphreysWalker - 2 wheels;Wheelchair - manual      Prior Function Level of Independence: Needs assistance   Gait / Transfers Assistance Needed: 10 feet at a time with the walker   ADL's / Homemaking Assistance Needed: Assist with bathing - states she could dress herself.        Hand Dominance   Dominant Hand: Right    Extremity/Trunk Assessment   Upper Extremity Assessment: Generalized weakness           Lower Extremity Assessment: Generalized weakness      Cervical / Trunk Assessment: Kyphotic  Communication   Communication: HOH  Cognition Arousal/Alertness: Lethargic Behavior During  Therapy: WFL for tasks assessed/performed Overall Cognitive Status: Within Functional Limits for tasks assessed       Memory: Decreased short-term memory              General Comments      Exercises        Assessment/Plan    PT Assessment Patient needs continued PT services  PT Diagnosis Generalized weakness   PT Problem List Decreased strength;Decreased range of motion;Decreased activity tolerance;Decreased balance;Decreased mobility;Decreased knowledge of use of DME;Decreased safety awareness;Decreased knowledge of precautions;Pain  PT Treatment Interventions DME instruction;Gait training;Stair  training;Functional mobility training;Therapeutic activities;Therapeutic exercise;Neuromuscular re-education;Patient/family education   PT Goals (Current goals can be found in the Care Plan section) Acute Rehab PT Goals Patient Stated Goal: To get back to her ALF PT Goal Formulation: With patient/family Time For Goal Achievement: 06-10-2013 Potential to Achieve Goals: Fair    Frequency Min 2X/week   Barriers to discharge        Co-evaluation               End of Session Equipment Utilized During Treatment: Gait belt Activity Tolerance: Patient limited by fatigue;Patient limited by lethargy Patient left: in bed;with call bell/phone within reach;with family/visitor present Nurse Communication: Mobility status         Time: 1540-1605 PT Time Calculation (min): 25 min   Charges:   PT Evaluation $Initial PT Evaluation Tier I: 1 Procedure PT Treatments $Therapeutic Activity: 8-22 mins   PT G CodesRuthann Cancer:          Aahan Marques Hamilton 09/11/2013, 4:40 PM  Ruthann CancerLaura Hamilton, PT, DPT Acute Rehabilitation Services Pager: [phone]

## 2013-09-11 NOTE — Progress Notes (Signed)
[facility] PROGRESS NOTE  Assessment/Plan: Acute respiratory failure with hypoxia due to  HCAP (healthcare-associated pneumonia): - started her on IV vancomycin and IV cefepime on 4.15.2015..  - blood and sputum cultures pending. - urine for strep pneumonia and legionella antigen ordered  - nasal canula oxygen as needed. Fevers overnight.  Atrial fibrillation:  - chronic and rate controlled.  - resume diltiazem.  Acute encephalopathy: - possibly from the pneumonia. Now improved.  Chronic undeterminate CHF:  - elevated probnp.  - Hold  lasix, no JVD, dry mucosae membrane, check orthostatics. - daily weights and intake and output   Code Status: DNR  Family Communication: discussed with daughters at bedside  Disposition Plan: possibly back to SNF when stable.     Consultants:  none  Procedures:  CXR  Antibiotics:  vanc and cefepime 4.16.2015   HPI/Subjective: thirsty  Objective: Filed Vitals:   09/10/13 2031 09/11/13 0034 09/11/13 0100 09/11/13 0521  BP: 115/66 106/65  97/52  Pulse: 102 100  100  Temp: 98.9 F (37.2 C) 100.3 F (37.9 C) 97.3 F (36.3 C) 98.7 F (37.1 C)  TempSrc: Oral Oral Oral Oral  Resp: 18 16  18   Height:      Weight:    66.134 kg (145 lb 12.8 oz)  SpO2: 95% 93%  98%    Intake/Output Summary (Last 24 hours) at 09/11/13 0743 Last data filed at 09/11/13 0100  Gross per 24 hour  Intake    350 ml  Output      0 ml  Net    350 ml   Filed Weights   09/10/13 1530 09/11/13 0521  Weight: 67.268 kg (148 lb 4.8 oz) 66.134 kg (145 lb 12.8 oz)    Exam:  General: Alert, awake, oriented x3, in no acute distress.  HEENT: No bruits, no goiter. -JVD Heart: IRegular rate and rhythm, Lungs: Good air movement, bilateral air movement.  Abdomen: Soft, nontender, nondistended, positive bowel sounds.  Neuro: Grossly intact, nonfocal.   Data Reviewed: Basic Metabolic Panel:  Recent Labs Lab 09/09/13 1459 09/10/13 1237  09/11/13 0325  NA 137 139 138  K 3.7 3.7 3.3*  CL 100 98 94*  CO2 27 28 27   GLUCOSE 129* 138* 126*  BUN 15 19 17   CREATININE 0.9 0.85 0.87  CALCIUM 9.3 9.2 8.8   Liver Function Tests:  Recent Labs Lab 09/09/13 1459 09/10/13 1237  AST 29 24  ALT 16 15  ALKPHOS 66 65  BILITOT 1.2 0.8  PROT 8.1 7.6  ALBUMIN 3.7 3.2*   No results found for this basename: LIPASE, AMYLASE,  in the last 168 hours No results found for this basename: AMMONIA,  in the last 168 hours CBC:  Recent Labs Lab 09/10/13 1237 09/11/13 0325  WBC 14.6* 17.4*  NEUTROABS 12.4*  --   HGB 12.3 11.5*  HCT 36.5 34.4*  MCV 96.3 96.1  PLT 189 192   Cardiac Enzymes:  Recent Labs Lab 09/10/13 1237  TROPONINI <0.30   BNP (last 3 results)  Recent Labs  09/10/13 1237  PROBNP 10558.0*   CBG: No results found for this basename: GLUCAP,  in the last 168 hours  Recent Results (from the past 240 hour(s))  MRSA PCR SCREENING     Status: None   Collection Time    09/10/13  3:32 PM      Result Value Ref Range Status   MRSA by PCR NEGATIVE  NEGATIVE Final   Comment:  The GeneXpert MRSA Assay (FDA     approved for NASAL specimens     only), is one component of a     comprehensive MRSA colonization     surveillance program. It is not     intended to diagnose MRSA     infection nor to guide or     monitor treatment for     MRSA infections.     Studies: Dg Chest 2 View  09/10/2013   CLINICAL DATA:  Shortness of breath weakness.  EXAM: CHEST  2 VIEW  COMPARISON:  06/25/2012, 06/13/2012 and 06/11/2012  FINDINGS: Lungs are adequately inflated demonstrate focal consolidation over the posterior right upper lobe as well as opacification in the lung bases. Likely small amount a left pleural fluid. Stable moderate cardiomegaly. Calcified plaque is present over the aortic arch. Remainder of the exam is unchanged.  IMPRESSION: Multifocal airspace process predominately involving the posterior segment  right upper lobe with additional involvement of the lung bases. Findings suggest multifocal pneumonia. Possible small left pleural effusion. Note that the right upper lobe airspace process is recurrent as consider chest CT on elective basis 3-4 weeks posttreatment for further evaluation to exclude endobronchial abnormality.   Electronically Signed   By: Elberta Fortisaniel  Boyle M.D.   On: 09/10/2013 13:18    Scheduled Meds: . aspirin EC  81 mg Oral Daily  . atorvastatin  10 mg Oral q1800  . ceFEPime (MAXIPIME) IV  2 g Intravenous Once  . ceFEPime (MAXIPIME) IV  2 g Intravenous Q24H  . ciprofloxacin  1 drop Both Eyes Q4H while awake  . clopidogrel  75 mg Oral Daily  . diltiazem  120 mg Oral q morning - 10a  . diltiazem  60 mg Oral 2 times per day  . enoxaparin (LOVENOX) injection  40 mg Subcutaneous Q24H  . feeding supplement (ENSURE COMPLETE)  237 mL Oral Daily  . ferrous sulfate  325 mg Oral Q breakfast  . furosemide  20 mg Intravenous Daily  . lidocaine  1 patch Transdermal Q24H  . pantoprazole  40 mg Oral Q1200  . vancomycin  1,000 mg Intravenous Once  . vancomycin  1,000 mg Intravenous Q24H   Continuous Infusions:    Marinda ElkAbraham Feliz Ortiz  [facility] Pager [phone]. If 8PM-8AM, please contact night-coverage at [email or web address], password [email or web address] 09/11/2013, 7:43 AM  LOS: 1 day

## 2013-09-11 NOTE — Evaluation (Signed)
Clinical/Bedside Swallow Evaluation Patient Details  Name: Sylvia Duncan MRN: [id number] Date of Birth: Apr 08, 1913  Today's Date: 09/11/2013 Time: 0820-0840 SLP Time Calculation (min): 20 min  Past Medical History:  Past Medical History  Diagnosis Date  . Hypertension   . Osteopenia   . Atrial fibrillation     not coumadin candidate  . Impaired gait   . Gait abnormality     imbalance  . Urinary incontinence   . Diarrhea     chronic  . Barrett's esophagus   . Edema leg     chronic, LEFT leg, u/s 2009 neg for DVT  . CVA (cerebral infarction) 01/26/2012  . Pneumonia     "several times"  . Chronic bronchitis     "get it q yr since my later years" (09/10/2013)  . GERD (gastroesophageal reflux disease)   . WGNFAOZH(086.5Headache(784.0)     "often" (09/10/2013)  . Migraine     "quite often; concentrates in my left eye" (09/10/2013)  . Osteoarthritis   . Arthritis     "very bad in my left shoulder" (09/10/2013)   Past Surgical History:  Past Surgical History  Procedure Laterality Date  . Tonsillectomy  1920's  . Hysterectomy-unknown      pt denies this hx on 09/10/2013  . Cholecystectomy      had pancreatitis   HPI:  Sylvia Duncan is a 80 y.o. female with prior h/o atrial fibrillation not on anticoagulation , hypertension, barrett's esophagus, CVA residing in the SNF was brought in for sob,. She was seen at PCP office and was ordered CXR and some labs, before she could get them done today, she was brought in to ED for hypoxia and fever. On arrival to ED, she underwent CXR revealing multi focal pneumonia. Barium swallow in 06 showed small Zenkers diverticulum, prominent CP and mild reflux, pt then underwent dilatation. On 11/07/07 MBS recommended Dysphagia 3 nectar due to intermittent delay with silent aspiration of thin liquids. Multiple swallows recommended. Pt had a BSE during admit in 5/13, dtr wanted her mother to continue regular diet and thin liquids with known risk despite overt signs  of aspiration.    Assessment / Plan / Recommendation Clinical Impression  This pt is known to SLP service from prior admissions. She has a history of esophageal and oropharyngeal dysphagia with silent aspiration of thin liquids. During an admission in 2013 the pts dtr accepted risk of aspiration to allow her mother to drink foods and liquids of choice with known risk. SLP attempted to call pts dtr to discuss her wishes, but there was no response. During today's assessment the pt accepted limited amounts of PO, but did not demosntrate any overt signs of aspiration. She was unable to masticate solids due to weakness, could not bite cracker, but states she typically eats meats without difficulty. Despite no findings of subjective signs of aspiration, based on history and finding of pna, pt is at risk of aspiration with PO. For now recommend a dys 1 diet with nectar thick liquids until family can be consulted on their wishes for her diet. SLP will f/u for discussion with MD and family.     Aspiration Risk  Moderate    Diet Recommendation Dysphagia 1 (Puree);Nectar-thick liquid   Liquid Administration via: Cup;Straw Medication Administration: Whole meds with liquid Supervision: Patient able to self feed Compensations: Small sips/bites;Slow rate Postural Changes and/or Swallow Maneuvers: Seated upright 90 degrees    Other  Recommendations Oral Care Recommendations: Oral care BID  Other Recommendations: Order thickener from pharmacy   Follow Up Recommendations  Skilled Nursing facility    Frequency and Duration min 1 x/week  2 weeks   Pertinent Vitals/Pain NA    SLP Swallow Goals     Swallow Study Prior Functional Status       General HPI: Sylvia Duncan is a 80 y.o. female with prior h/o atrial fibrillation not on anticoagulation , hypertension, barrett's esophagus, CVA residing in the SNF was brought in for sob,. She was seen at PCP office and was ordered CXR and some labs, before she  could get them done today, she was brought in to ED for hypoxia and fever. On arrival to ED, she underwent CXR revealing multi focal pneumonia. Barium swallow in 06 showed small Zenkers diverticulum, prominent CP and mild reflux, pt then underwent dilatation. On 11/07/07 MBS recommended Dysphagia 3 nectar due to intermittent delay with silent aspiration of thin liquids. Multiple swallows recommended. Pt had a BSE during admit in 5/13, dtr wanted her mother to continue regular diet and thin liquids with known risk despite overt signs of aspiration.  Type of Study: Bedside swallow evaluation Diet Prior to this Study: Regular;Thin liquids Temperature Spikes Noted: Yes Respiratory Status: Nasal cannula History of Recent Intubation: No Behavior/Cognition: Alert;Cooperative Oral Cavity - Dentition: Adequate natural dentition Self-Feeding Abilities: Needs assist Patient Positioning: Upright in bed Baseline Vocal Quality: Clear Volitional Cough: Strong Volitional Swallow: Able to elicit    Oral/Motor/Sensory Function Overall Oral Motor/Sensory Function: Appears within functional limits for tasks assessed   Ice Chips     Thin Liquid Thin Liquid: Within functional limits Presentation: Cup;Straw    Nectar Thick Nectar Thick Liquid: Not tested   Honey Thick Honey Thick Liquid: Not tested   Puree Puree: Within functional limits   Solid   GO    Solid: Impaired Presentation: Self Fed Oral Phase Impairments: Impaired mastication (no mandibular strength to masticate)      Harlon DittyBonnie Leticia Coletta, MA CCC-SLP [phone]  Riley NearingBonnie Caroline Dalessandro Baldyga 09/11/2013,8:52 AM

## 2013-09-11 NOTE — Progress Notes (Signed)
INITIAL NUTRITION ASSESSMENT  DOCUMENTATION CODES Per approved criteria  -Not Applicable   INTERVENTION: Continue Ensure Complete once daily Provide Ensure Pudding once daily Provide Magic Cup with meals  NUTRITION DIAGNOSIS: Inadequate oral intake related to poor appetite as evidenced by family report.   Goal: Pt to meet >/= 90% of their estimated nutrition needs   Monitor:  PO intake, weight, labs  Reason for Assessment: Malnutrition Screening Tool, score of 3  80 y.o. female  Admitting Dx: Acute respiratory failure with hypoxia  ASSESSMENT: 80 y.o. female with prior h/o atrial fibrillation not on anticoagulation , hypertension, residing in the SNF was brought in for sob,. She was seen at PCP office and was ordered CXR and some labs, before she could get them done today, she was brought in to ED for hypoxia and fever. On arrival to ED, she underwent CXR revealing multi focal pneumonia.   Pt asleep at time of visit with daughter at bedside. Per daughter pt has a poor appetite and eats very little which has been going on a long time. Per daughter pt reports usual body weight is 140 lbs and pt denies weight loss but, daughter states pt looks noticeably thinner. Daughter states pt only drank half of her sweet tea last night for dinner, did not eat. No evidence of recent weight loss per weight history. Pt evaluated by SLP this AM and is now on a Dysphagia 1 diet with nectar-thick liquids.  Nutrition Focused Physical Exam:  Subcutaneous Fat:  Orbital Region: wnl Upper Arm Region: wnl Thoracic and Lumbar Region: NA  Muscle:  Temple Region: wnl Clavicle Bone Region: mild wasting Clavicle and Acromion Bone Region: wnl Scapular Bone Region: NA Dorsal Hand: mild wasting Patellar Region: wnl Anterior Thigh Region: wnl Posterior Calf Region: wnl  Edema: none noted   Height: Ht Readings from Last 1 Encounters:  09/10/13 5\' 6"  (1.676 m)    Weight: Wt Readings from Last 1  Encounters:  09/11/13 145 lb 12.8 oz (66.134 kg)    Ideal Body Weight: 130 lbs  % Ideal Body Weight: 112%  Wt Readings from Last 10 Encounters:  09/11/13 145 lb 12.8 oz (66.134 kg)  06/15/12 138 lb 12.8 oz (62.959 kg)  02/06/12 135 lb (61.236 kg)  01/24/12 116 lb 3.2 oz (52.708 kg)  11/24/11 128 lb 4 oz (58.174 kg)  11/17/11 125 lb (56.7 kg)  11/06/11 127 lb 12.8 oz (57.97 kg)  10/14/11 131 lb 9.8 oz (59.7 kg)  07/18/11 135 lb (61.236 kg)  04/14/11 128 lb (58.06 kg)    Usual Body Weight: 140 lbs  % Usual Body Weight: 104%  BMI:  Body mass index is 23.54 kg/(m^2).  Estimated Nutritional Needs: Kcal: 1450-1650 Protein: 70-80 grams Fluid: 1.5-1.7 L/day  Skin: intact  Diet Order: Dysphagia 1, necar-thick liquids  EDUCATION NEEDS: -No education needs identified at this time   Intake/Output Summary (Last 24 hours) at 09/11/13 1049 Last data filed at 09/11/13 0823  Gross per 24 hour  Intake    410 ml  Output      0 ml  Net    410 ml    Last BM: 4/15  Labs:   Recent Labs Lab 09/09/13 1459 09/10/13 1237 09/11/13 0325  NA 137 139 138  K 3.7 3.7 3.3*  CL 100 98 94*  CO2 27 28 27   BUN 15 19 17   CREATININE 0.9 0.85 0.87  CALCIUM 9.3 9.2 8.8  GLUCOSE 129* 138* 126*    CBG (last  3)  No results found for this basename: GLUCAP,  in the last 72 hours  Scheduled Meds: . aspirin EC  81 mg Oral Daily  . atorvastatin  10 mg Oral q1800  . ceFEPime (MAXIPIME) IV  2 g Intravenous Once  . ceFEPime (MAXIPIME) IV  2 g Intravenous Q24H  . ciprofloxacin  1 drop Both Eyes Q4H while awake  . clopidogrel  75 mg Oral Daily  . diltiazem  120 mg Oral q morning - 10a  . diltiazem  60 mg Oral 2 times per day  . enoxaparin (LOVENOX) injection  40 mg Subcutaneous Q24H  . feeding supplement (ENSURE COMPLETE)  237 mL Oral Daily  . ferrous sulfate  325 mg Oral Q breakfast  . lidocaine  1 patch Transdermal Q24H  . pantoprazole  40 mg Oral Q1200  . vancomycin  1,000 mg  Intravenous Once  . vancomycin  1,000 mg Intravenous Q24H    Continuous Infusions:   Past Medical History  Diagnosis Date  . Hypertension   . Osteopenia   . Atrial fibrillation     not coumadin candidate  . Impaired gait   . Gait abnormality     imbalance  . Urinary incontinence   . Diarrhea     chronic  . Barrett's esophagus   . Edema leg     chronic, LEFT leg, u/s 2009 neg for DVT  . CVA (cerebral infarction) 01/26/2012  . Pneumonia     "several times"  . Chronic bronchitis     "get it q yr since my later years" (09/10/2013)  . GERD (gastroesophageal reflux disease)   . ZOXWRUEA(540.9Headache(784.0)     "often" (09/10/2013)  . Migraine     "quite often; concentrates in my left eye" (09/10/2013)  . Osteoarthritis   . Arthritis     "very bad in my left shoulder" (09/10/2013)    Past Surgical History  Procedure Laterality Date  . Tonsillectomy  1920's  . Hysterectomy-unknown      pt denies this hx on 09/10/2013  . Cholecystectomy      had pancreatitis    Ian Malkineanne Barnett RD, LDN Inpatient Clinical Dietitian Pager: [phone] After Hours Pager: [phone]

## 2013-09-12 DIAGNOSIS — E119 Type 2 diabetes mellitus without complications: Secondary | ICD-10-CM

## 2013-09-12 LAB — CBC
HCT: 35.1 % — ABNORMAL LOW (ref 36.0–46.0)
HEMOGLOBIN: 11.4 g/dL — AB (ref 12.0–15.0)
MCH: 31.4 pg (ref 26.0–34.0)
MCHC: 32.5 g/dL (ref 30.0–36.0)
MCV: 96.7 fL (ref 78.0–100.0)
Platelets: 195 10*3/uL (ref 150–400)
RBC: 3.63 MIL/uL — ABNORMAL LOW (ref 3.87–5.11)
RDW: 14.5 % (ref 11.5–15.5)
WBC: 11.5 10*3/uL — AB (ref 4.0–10.5)

## 2013-09-12 LAB — BASIC METABOLIC PANEL
BUN: 20 mg/dL (ref 6–23)
CO2: 28 mEq/L (ref 19–32)
Calcium: 8.7 mg/dL (ref 8.4–10.5)
Chloride: 96 mEq/L (ref 96–112)
Creatinine, Ser: 0.84 mg/dL (ref 0.50–1.10)
GFR calc Af Amer: 65 mL/min — ABNORMAL LOW (ref >90)
GFR calc non Af Amer: 56 mL/min — ABNORMAL LOW (ref >90)
GLUCOSE: 95 mg/dL (ref 70–99)
POTASSIUM: 4.9 meq/L (ref 3.7–5.3)
SODIUM: 136 meq/L — AB (ref 137–147)

## 2013-09-12 LAB — LEGIONELLA ANTIGEN, URINE: Legionella Antigen, Urine: NEGATIVE

## 2013-09-12 MED ORDER — LEVOFLOXACIN 750 MG PO TABS: 750.0000 mg | ORAL_TABLET | Freq: Every day | ORAL | Status: DC

## 2013-09-12 MED ORDER — LEVOFLOXACIN 750 MG PO TABS
750.0000 mg | ORAL_TABLET | ORAL | Status: DC
  Administered 2013-09-12: 750 mg via ORAL
  Filled 2013-09-12 (×2): qty 1

## 2013-09-12 MED ORDER — FUROSEMIDE 20 MG PO TABS
20.0000 mg | ORAL_TABLET | ORAL | Status: DC
Start: 2013-09-12 — End: 2013-09-13
  Administered 2013-09-12: 20 mg via ORAL
  Filled 2013-09-12: qty 1

## 2013-09-12 MED ORDER — LEVOFLOXACIN 750 MG PO TABS
750.0000 mg | ORAL_TABLET | ORAL | Status: AC
Start: 2013-09-12 — End: ?

## 2013-09-12 MED ORDER — ASPIRIN 81 MG PO TBEC: 81.0000 mg | DELAYED_RELEASE_TABLET | Freq: Every day | ORAL | Status: DC

## 2013-09-12 MED ORDER — ACETAMINOPHEN 500 MG PO TABS: 1000.0000 mg | ORAL_TABLET | Freq: Four times a day (QID) | ORAL | Status: DC | PRN

## 2013-09-12 MED ORDER — ASPIRIN 81 MG PO CHEW: 81.0000 mg | CHEWABLE_TABLET | Freq: Every day | ORAL | Status: DC

## 2013-09-12 NOTE — Progress Notes (Signed)
[facility] PROGRESS NOTE  Assessment/Plan: Acute respiratory failure with hypoxia due to  HCAP (healthcare-associated pneumonia): - started her on IV vancomycin and IV cefepime on 4.15.2015., de-escalate to levaquin. - blood and sputum cultures pending. - urine for strep pneumonia and legionella antigen ordered  - Speech rec Dys 1 diet. afebrile overnight.  Atrial fibrillation:  - chronic and rate controlled.  - resume diltiazem.  Acute encephalopathy: - possibly from the pneumonia. Now improved.  Chronic undeterminate CHF:  - Resume  lasix, no JVD, dry mucosae membrane, check orthostatics. - daily weights and intake and output   Code Status: DNR  Family Communication: discussed with daughters at bedside  Disposition Plan: possibly back to SNF when stable.     Consultants:  none  Procedures:  CXR  Antibiotics:  vanc and cefepime 4.16.2015   HPI/Subjective: Back pain  Objective: Filed Vitals:   09/11/13 0930 09/11/13 1541 09/11/13 2106 09/12/13 0427  BP: 104/69 104/59 110/71 108/74  Pulse: 112 95 89 95  Temp:  97.8 F (36.6 C) 98.4 F (36.9 C) 98.4 F (36.9 C)  TempSrc:  Oral Oral Oral  Resp:  18 18 18   Height:      Weight:    65.091 kg (143 lb 8 oz)  SpO2:  98% 94% 96%    Intake/Output Summary (Last 24 hours) at 09/12/13 0727 Last data filed at 09/11/13 1756  Gross per 24 hour  Intake    180 ml  Output      0 ml  Net    180 ml   Filed Weights   09/10/13 1530 09/11/13 0521 09/12/13 0427  Weight: 67.268 kg (148 lb 4.8 oz) 66.134 kg (145 lb 12.8 oz) 65.091 kg (143 lb 8 oz)    Exam:  General: Alert, awake, oriented x3, in no acute distress.  HEENT: No bruits, no goiter. -JVD Heart: IRegular rate and rhythm, Lungs: Good air movement, bilateral air movement.  Abdomen: Soft, nontender, nondistended, positive bowel sounds.    Data Reviewed: Basic Metabolic Panel:  Recent Labs Lab 09/09/13 1459 09/10/13 1237 09/11/13 0325  09/12/13 0552  NA 137 139 138 136*  K 3.7 3.7 3.3* 4.9  CL 100 98 94* 96  CO2 27 28 27 28   GLUCOSE 129* 138* 126* 95  BUN 15 19 17 20   CREATININE 0.9 0.85 0.87 0.84  CALCIUM 9.3 9.2 8.8 8.7   Liver Function Tests:  Recent Labs Lab 09/09/13 1459 09/10/13 1237  AST 29 24  ALT 16 15  ALKPHOS 66 65  BILITOT 1.2 0.8  PROT 8.1 7.6  ALBUMIN 3.7 3.2*   No results found for this basename: LIPASE, AMYLASE,  in the last 168 hours No results found for this basename: AMMONIA,  in the last 168 hours CBC:  Recent Labs Lab 09/10/13 1237 09/11/13 0325 09/12/13 0552  WBC 14.6* 17.4* 11.5*  NEUTROABS 12.4*  --   --   HGB 12.3 11.5* 11.4*  HCT 36.5 34.4* 35.1*  MCV 96.3 96.1 96.7  PLT 189 192 195   Cardiac Enzymes:  Recent Labs Lab 09/10/13 1237  TROPONINI <0.30   BNP (last 3 results)  Recent Labs  09/10/13 1237  PROBNP 10558.0*   CBG: No results found for this basename: GLUCAP,  in the last 168 hours  Recent Results (from the past 240 hour(s))  URINE CULTURE     Status: None   Collection Time    09/10/13  1:31 PM      Result Value  Ref Range Status   Specimen Description URINE, CATHETERIZED   Final   Special Requests NONE   Final   Culture  Setup Time     Final   Value: 09/10/2013 16:29     Performed at [facility]   Colony Count     Final   Value: NO GROWTH     Performed at [facility]   Culture     Final   Value: NO GROWTH     Performed at [facility]   Report Status 09/11/2013 FINAL   Final  CULTURE, BLOOD (ROUTINE X 2)     Status: None   Collection Time    09/10/13  2:45 PM      Result Value Ref Range Status   Specimen Description BLOOD HAND RIGHT   Final   Special Requests BOTTLES DRAWN AEROBIC AND ANAEROBIC 10CC   Final   Culture  Setup Time     Final   Value: 09/10/2013 19:11     Performed at [facility]   Culture     Final   Value:        BLOOD CULTURE RECEIVED NO GROWTH TO DATE CULTURE WILL BE HELD FOR 5 DAYS  BEFORE ISSUING A FINAL NEGATIVE REPORT     Performed at [facility]   Report Status PENDING   Incomplete  CULTURE, BLOOD (ROUTINE X 2)     Status: None   Collection Time    09/10/13  2:55 PM      Result Value Ref Range Status   Specimen Description BLOOD HAND LEFT   Final   Special Requests BOTTLES DRAWN AEROBIC ONLY 10CC   Final   Culture  Setup Time     Final   Value: 09/10/2013 19:14     Performed at [facility]   Culture     Final   Value:        BLOOD CULTURE RECEIVED NO GROWTH TO DATE CULTURE WILL BE HELD FOR 5 DAYS BEFORE ISSUING A FINAL NEGATIVE REPORT     Performed at [facility]   Report Status PENDING   Incomplete  MRSA PCR SCREENING     Status: None   Collection Time    09/10/13  3:32 PM      Result Value Ref Range Status   MRSA by PCR NEGATIVE  NEGATIVE Final   Comment:            The GeneXpert MRSA Assay (FDA     approved for NASAL specimens     only), is one component of a     comprehensive MRSA colonization     surveillance program. It is not     intended to diagnose MRSA     infection nor to guide or     monitor treatment for     MRSA infections.     Studies: Dg Chest 2 View  09/10/2013   CLINICAL DATA:  Shortness of breath weakness.  EXAM: CHEST  2 VIEW  COMPARISON:  06/25/2012, 06/13/2012 and 06/11/2012  FINDINGS: Lungs are adequately inflated demonstrate focal consolidation over the posterior right upper lobe as well as opacification in the lung bases. Likely small amount a left pleural fluid. Stable moderate cardiomegaly. Calcified plaque is present over the aortic arch. Remainder of the exam is unchanged.  IMPRESSION: Multifocal airspace process predominately involving the posterior segment right upper lobe with additional involvement of the lung bases. Findings suggest multifocal pneumonia. Possible small  left pleural effusion. Note that the right upper lobe airspace process is recurrent as consider chest CT on elective basis 3-4  weeks posttreatment for further evaluation to exclude endobronchial abnormality.   Electronically Signed   By: Elberta Fortis M.D.   On: 09/10/2013 13:18    Scheduled Meds: . aspirin  81 mg Oral Daily  . aspirin EC  81 mg Oral Daily  . atorvastatin  10 mg Oral q1800  . ceFEPime (MAXIPIME) IV  1 g Intravenous Q24H  . ceFEPime (MAXIPIME) IV  2 g Intravenous Once  . ciprofloxacin  1 drop Both Eyes Q4H while awake  . clopidogrel  75 mg Oral Daily  . diltiazem  120 mg Oral q morning - 10a  . diltiazem  60 mg Oral 2 times per day  . enoxaparin (LOVENOX) injection  40 mg Subcutaneous Q24H  . feeding supplement (ENSURE COMPLETE)  237 mL Oral Q24H  . feeding supplement (ENSURE)  1 Container Oral Q24H  . ferrous sulfate  325 mg Oral Q breakfast  . furosemide  20 mg Oral Q M,W,F  . lidocaine  1 patch Transdermal Q24H  . pantoprazole  40 mg Oral Q1200  . vancomycin  1,000 mg Intravenous Once  . vancomycin  1,000 mg Intravenous Q24H   Continuous Infusions:    Marinda Elk  [facility] Pager [phone]. If 8PM-8AM, please contact night-coverage at [email or web address], password [facility] 09/12/2013, 7:27 AM  LOS: 2 days

## 2013-09-12 NOTE — Progress Notes (Signed)
Speech Language Pathology Treatment: Dysphagia  Patient Details Name: Sylvia Memssther B Jorgenson MRN: [id number] DOB: July 06, 1912 Today's Date: 09/12/2013 Time: [id number] SLP Time Calculation (min): 19 min  Assessment / Plan / Recommendation Clinical Impression  Patient was drinking liquid Ensure via straw, with only an occasional throat clear noted.  There was no audible throat clearing after nectar thick liquids.  Patient did not seem to mind the thickener, stating, "Oh, that's good." Rec. Advancing to Dys 2 diet (Chopped foods) and Nectar thick.  Unable to reach family for education to these recommendations.   HPI HPI: Sylvia Duncan is a 80 y.o. female with prior h/o atrial fibrillation not on anticoagulation , hypertension, barrett's esophagus, CVA residing in the SNF was brought in for sob,. She was seen at PCP office and was ordered CXR and some labs, before she could get them done today, she was brought in to ED for hypoxia and fever. On arrival to ED, she underwent CXR revealing multi focal pneumonia. Barium swallow in 06 showed small Zenkers diverticulum, prominent CP and mild reflux, pt then underwent dilatation. On 11/07/07 MBS recommended Dysphagia 3 nectar due to intermittent delay with silent aspiration of thin liquids. Multiple swallows recommended. Pt had a BSE during admit in 5/13, dtr wanted her mother to continue regular diet and thin liquids with known risk despite overt signs of aspiration.    Pertinent Vitals Afebrile; diminished LS  SLP Plan  Continue with current plan of care    Recommendations Diet recommendations: Dysphagia 2 (fine chop);Nectar-thick liquid Liquids provided via: Cup;Straw Medication Administration:  (Whole with nectar thick liquids) Supervision: Patient able to self feed Compensations: Small sips/bites;Slow rate Postural Changes and/or Swallow Maneuvers: Seated upright 90 degrees              Oral Care Recommendations: Oral care BID Follow up  Recommendations: Skilled Nursing facility Plan: Continue with current plan of care    GO     Lenor DerrickLori T Theadore Blunck 09/12/2013, 2:49 PM

## 2013-09-12 NOTE — Clinical Social Work Placement (Addendum)
    Clinical Social Work Department CLINICAL SOCIAL WORK PLACEMENT NOTE 09/12/2013  Patient:  Sylvia Duncan,[name]  Account Number:  [id number] Admit date:  09/10/2013  Clinical Social Worker:  Lupita LeashNNA CROWDER, LCSWA  Date/time:  09/11/2013 01:54 PM  Clinical Social Work is seeking post-discharge placement for this patient at the following level of care:   SKILLED NURSING   (*CSW will update this form in Epic as items are completed)     Patient/family provided with [facility] Department of Clinical Social Work's list of facilities offering this level of care within the geographic area requested by the patient (or if unable, by the patient's family).    Patient/family informed of their freedom to choose among providers that offer the needed level of care, that participate in Medicare, Medicaid or managed care program needed by the patient, have an available bed and are willing to accept the patient.    Patient/family informed of [facility]' ownership interest in [facility], as well as of the fact that they are under no obligation to receive care at this facility.  PASARR submitted to EDS on 09/12/2013 PASARR number received from EDS on 09/12/2013  FL2 transmitted to all facilities in geographic area requested by pt/family on  09/11/2013 FL2 transmitted to all facilities within larger geographic area on   Patient informed that his/her managed care company has contracts with or will negotiate with  certain facilities, including the following:   NA     Patient/family informed of bed offers received:  09/12/2013 [facility] [place] Physician recommends and patient chooses bed at    [facility] [place] on  09/13/2013-Love Chowning Patrick-Jefferson, LCSWA Patient to be transferred to facility by Ambulance Sharin Mons(PTAR)  The following physician request were entered in Epic:   Additional  Comments:

## 2013-09-12 NOTE — Clinical Social Work Psychosocial (Signed)
     Clinical Social Work Department BRIEF PSYCHOSOCIAL ASSESSMENT 09/11/2013  Patient:  Sylvia Duncan,Sylvia Duncan     Account Number:  [id number]     Admit date:  09/10/2013  Clinical Social Worker:  Tiburcio PeaROWDER,Cassanda Walmer, LCSWA  Date/Time:  09/11/2013 12:30 PM  Referred by:  Physician  Date Referred:  09/11/2013 Referred for  SNF Placement   Other Referral:   Interview type:  Other - See comment Other interview type:   Patient and daughter [name]    PSYCHOSOCIAL DATA Living Status:  FACILITY Admitted from facility:  [facility] [place] Level of care:  Assisted Living Primary support name:  Christell ConstantSherrill Duncan  (c) [phone] Primary support relationship to patient:  CHILD, ADULT Degree of support available:   Strong support    CURRENT CONCERNS Current Concerns  Post-Acute Placement   Other Concerns:    SOCIAL WORK ASSESSMENT / PLAN Resident of [facility]. Patient has managed well at facility and daughter states that she really enjoys being there.  Per PT- SNF for rehab is recommended and patient/daughter want to remain at [facility] and to go to their SNF unit.  CSW [facility] be able to accept patient back to either ALF or to SNF. Fl2 placed on chart for MD's signature.   Assessment/plan status:  Psychosocial Support/Ongoing Assessment of Needs Other assessment/ plan:   Information/referral to community resources:   SNF list deferred- plans to return to same facility- increased level of care.    PATIENTS/FAMILYS RESPONSE TO PLAN OF CARE: Patient is alert but was sleeping most of the time during today's visit. She is oriented most of the time per daughter but has not been feeling well (patient diagnosed with pneumonia).  She has managed very well at the assited living per daughter who states that patient "loves the facility and they love her". Patient awoke briefly during the visit and re-stated above but is  also willing to accept SNF- just wants to stay at [facility]. CSW will assist with dc placement when medically stable.

## 2013-09-12 NOTE — Progress Notes (Signed)
Report given to receiving RN. Patient in bed sleeping. No verbal complaints and no signs or symptoms of distress or discomfort.  

## 2013-09-12 NOTE — Progress Notes (Signed)
Per P.T. Evaluation- SNF for short term rehab is indicated.  Ok per patient and her daughter [name].  CSW spoke to [place]- Admissions at [facility] [place]. They will have a SNF bed available for patient tomorrow.  Ok per Dr. David StallFeliz-Ortiz.  Will ask weekend CSW to facilitate d/c tomorrow.  DC summary sent to facility today.  Lorri Frederickonna T. West PughCrowder, LCSWA  [phone]

## 2013-09-12 NOTE — Discharge Summary (Addendum)
Physician Discharge Summary  Sylvia Duncan:[id number] DOB: 1913-02-22 DOA: 09/10/2013  PCP: Willow Ora, MD  Admit date: 09/10/2013 Discharge date: 09/13/2013  Time spent:  Recommendations for Outpatient Follow-up:  1. Follow up with PCP   Discharge Diagnoses:  Principal Problem:   Acute respiratory failure with hypoxia Active Problems:   HYPERTENSION   ATRIAL FIBRILLATION, PAROXYSMAL   DYSPHAGIA UNSPECIFIED   CHF (congestive heart failure)   HCAP (healthcare-associated pneumonia)   Acute encephalopathy   Discharge Condition: guarded  Diet recommendation: heart healthy  Filed Weights   09/11/13 0521 09/12/13 0427 09/13/13 0522  Weight: 66.134 kg (145 lb 12.8 oz) 65.091 kg (143 lb 8 oz) 66.316 kg (146 lb 3.2 oz)    History of present illness:  80 y.o. female  With prior h/o atrial fibrillation not on anticoagulation , hypertension, residing in the SNF was brought in for sob,. She was seen at PCP office and was ordered CXR and some labs, before she could get them done today, she was brought in to ED for hypoxia and fever. On arrival to ED, she underwent CXR revealing multi focal pneumonia. She is able to provide only minimal history and there is no family at bedside. She denies nausea, vomiting or diarrhea. Her labs reveal leukocytosis and elevated pro bnp. Her EKG shows chronic atrial fibrillation with rate control. She was then referred to medical service for admission for management of health care associated pneumonia.    Hospital Course:  Acute respiratory failure with hypoxia due to HCAP (healthcare-associated pneumonia):  - started her on IV vancomycin and IV cefepime on 4.15.2015., de-escalate to levaquin.  - blood and sputum cultures negative. - urine for strep pneumonia and legionella antigen ordered  - Speech rec Dys 1 diet. afebrile overnight.   Atrial fibrillation:  - chronic and rate controlled.   Acute encephalopathy:  - possibly from the  pneumonia. Now resolved.  Chronic undeterminate CHF:  - Resume lasix, no JVD, dry mucosae membrane, check orthostatics.  - daily weights and intake and output      Procedures:  CXR  Consultations:  none  Discharge Exam: Filed Vitals:   09/13/13 0522  BP: 102/71  Pulse: 91  Temp: 98 F (36.7 C)  Resp: 18    General: A&O x3 Cardiovascular: RRR Respiratory: good air movement CTA B/L  Discharge Instructions You were cared for by a hospitalist during your hospital stay. If you have any questions about your discharge medications or the care you received while you were in the hospital after you are discharged, you can call the unit and asked to speak with the hospitalist on call if the hospitalist that took care of you is not available. Once you are discharged, your primary care physician will handle any further medical issues. Please note that NO REFILLS for any discharge medications will be authorized once you are discharged, as it is imperative that you return to your primary care physician (or establish a relationship with a primary care physician if you do not have one) for your aftercare needs so that they can reassess your need for medications and monitor your lab values.      Discharge Orders   Future Appointments Provider Department Dept Phone   09/19/2013 11:30 AM [name], MD [facility] at  [place] [phone]   09/29/2013 3:00 PM [name], MD [facility]  [place] [facility] [phone]   Future Orders Complete By Expires   Diet - low sodium heart healthy  As directed    Increase activity slowly  As directed        Medication List         acetaminophen 500 MG tablet  Commonly known as:  TYLENOL  Take 1,000 mg by mouth every 6 (six) hours as needed. For pain     ASPIR-81 PO  Take 81 mg by mouth daily.     atorvastatin 10 MG tablet  Commonly known as:  LIPITOR  Take 1 tablet (10 mg total) by mouth daily at 6 PM.     calcium  carbonate 600 MG Tabs tablet  Commonly known as:  OS-CAL  Take 600 mg by mouth daily.     clopidogrel 75 MG tablet  Commonly known as:  PLAVIX  Take 75 mg by mouth daily.     diltiazem 60 MG tablet  Commonly known as:  CARDIZEM  Take 60-120 mg by mouth 3 (three) times daily. Take 2 tablets in AM, 1 tablet in Afternoon and 1 tablet in PM     ENSURE PLUS Liqd  Take 1 Can by mouth daily at 6 (six) AM.     ferrous sulfate 325 (65 FE) MG tablet  Take 325 mg by mouth daily with breakfast.     Fiber Caps  Take 1 capsule by mouth daily.     furosemide 40 MG tablet  Commonly known as:  LASIX  Take 20 mg by mouth every Monday, Wednesday, and Friday.     glucose blood test strip  1 each by Other route as needed for other. Use as instructed     HYDROcodone-acetaminophen 5-325 MG per tablet  Commonly known as:  NORCO/VICODIN  Take 0.5 tablets by mouth 3 (three) times daily as needed for pain.     levofloxacin 750 MG tablet  Commonly known as:  LEVAQUIN  Take 1 tablet (750 mg total) by mouth every other day.     LIDODERM 5 %  Generic drug:  lidocaine  Place 1 patch onto the skin daily. Remove & Discard patch within 12 hours or as directed by MD     NATURAL BALANCE OP  Place 2 drops into both eyes 3 (three) times daily.     pantoprazole 40 MG tablet  Commonly known as:  PROTONIX  Take 1 tablet (40 mg total) by mouth daily at 12 noon.       No Known Allergies Follow-up Information   Follow up with Willow OraJose Paz, MD On 09/19/2013. (hospital follow up @11 :30 am spoke with Amalia Haileyustin)    Specialty:  Internal Medicine   Contact information:   [phone] [place] [id number] [phone]        The results of significant diagnostics from this hospitalization (including imaging, microbiology, ancillary and laboratory) are listed below for reference.    Significant Diagnostic Studies: Dg Chest 2 View  09/10/2013   CLINICAL DATA:  Shortness of breath  weakness.  EXAM: CHEST  2 VIEW  COMPARISON:  06/25/2012, 06/13/2012 and 06/11/2012  FINDINGS: Lungs are adequately inflated demonstrate focal consolidation over the posterior right upper lobe as well as opacification in the lung bases. Likely small amount a left pleural fluid. Stable moderate cardiomegaly. Calcified plaque is present over the aortic arch. Remainder of the exam is unchanged.  IMPRESSION: Multifocal airspace process predominately involving the posterior segment right upper lobe with additional involvement of the lung bases. Findings suggest multifocal pneumonia. Possible small left pleural effusion. Note that the right upper  lobe airspace process is recurrent as consider chest CT on elective basis 3-4 weeks posttreatment for further evaluation to exclude endobronchial abnormality.   Electronically Signed   By: Elberta Fortis M.D.   On: 09/10/2013 13:18    Microbiology: Recent Results (from the past 240 hour(s))  URINE CULTURE     Status: None   Collection Time    09/10/13  1:31 PM      Result Value Ref Range Status   Specimen Description URINE, CATHETERIZED   Final   Special Requests NONE   Final   Culture  Setup Time     Final   Value: 09/10/2013 16:29     Performed at [facility] Count     Final   Value: NO GROWTH     Performed at [facility]   Culture     Final   Value: NO GROWTH     Performed at [facility]   Report Status 09/11/2013 FINAL   Final  CULTURE, BLOOD (ROUTINE X 2)     Status: None   Collection Time    09/10/13  2:45 PM      Result Value Ref Range Status   Specimen Description BLOOD HAND RIGHT   Final   Special Requests BOTTLES DRAWN AEROBIC AND ANAEROBIC 10CC   Final   Culture  Setup Time     Final   Value: 09/10/2013 19:11     Performed at [facility]   Culture     Final   Value:        BLOOD CULTURE RECEIVED NO GROWTH TO DATE CULTURE WILL BE HELD FOR 5 DAYS BEFORE ISSUING A FINAL NEGATIVE REPORT      Performed at [facility]   Report Status PENDING   Incomplete  CULTURE, BLOOD (ROUTINE X 2)     Status: None   Collection Time    09/10/13  2:55 PM      Result Value Ref Range Status   Specimen Description BLOOD HAND LEFT   Final   Special Requests BOTTLES DRAWN AEROBIC ONLY 10CC   Final   Culture  Setup Time     Final   Value: 09/10/2013 19:14     Performed at [facility]   Culture     Final   Value:        BLOOD CULTURE RECEIVED NO GROWTH TO DATE CULTURE WILL BE HELD FOR 5 DAYS BEFORE ISSUING A FINAL NEGATIVE REPORT     Performed at [facility]   Report Status PENDING   Incomplete  MRSA PCR SCREENING     Status: None   Collection Time    09/10/13  3:32 PM      Result Value Ref Range Status   MRSA by PCR NEGATIVE  NEGATIVE Final   Comment:            The GeneXpert MRSA Assay (FDA     approved for NASAL specimens     only), is one component of a     comprehensive MRSA colonization     surveillance program. It is not     intended to diagnose MRSA     infection nor to guide or     monitor treatment for     MRSA infections.     Labs: Basic Metabolic Panel:  Recent Labs Lab 09/09/13 1459 09/10/13 1237 09/11/13 0325 09/12/13 0552  NA 137 139 138 136*  K 3.7 3.7  3.3* 4.9  CL 100 98 94* 96  CO2 27 28 27 28   GLUCOSE 129* 138* 126* 95  BUN 15 19 17 20   CREATININE 0.9 0.85 0.87 0.84  CALCIUM 9.3 9.2 8.8 8.7   Liver Function Tests:  Recent Labs Lab 09/09/13 1459 09/10/13 1237  AST 29 24  ALT 16 15  ALKPHOS 66 65  BILITOT 1.2 0.8  PROT 8.1 7.6  ALBUMIN 3.7 3.2*   No results found for this basename: LIPASE, AMYLASE,  in the last 168 hours No results found for this basename: AMMONIA,  in the last 168 hours CBC:  Recent Labs Lab 09/10/13 1237 09/11/13 0325 09/12/13 0552  WBC 14.6* 17.4* 11.5*  NEUTROABS 12.4*  --   --   HGB 12.3 11.5* 11.4*  HCT 36.5 34.4* 35.1*  MCV 96.3 96.1 96.7  PLT 189 192 195   Cardiac Enzymes:  Recent  Labs Lab 09/10/13 1237  TROPONINI <0.30   BNP: BNP (last 3 results)  Recent Labs  09/10/13 1237  PROBNP 10558.0*   CBG: No results found for this basename: GLUCAP,  in the last 168 hours     Signed:  Marinda ElkAbraham Feliz Ortiz  [facility] 09/13/2013, 7:55 AM

## 2013-09-13 DIAGNOSIS — R197 Diarrhea, unspecified: Secondary | ICD-10-CM

## 2013-09-13 MED ORDER — MORPHINE SULFATE 2 MG/ML IJ SOLN
1.0000 mg | Freq: Once | INTRAMUSCULAR | Status: AC
  Administered 2013-09-13: 1 mg via INTRAVENOUS
  Filled 2013-09-13: qty 1

## 2013-09-13 NOTE — Progress Notes (Signed)
During the night patient complained of back pain. Staff nurse administered pain med per PRN order. About a couple of hours later patient states that the pain med was not working and that she needed something stronger. Patient also thought it was not given to her and nurse reminded patient that it had been given to her. Text paged on-call hospitalist. Orders given for one time dose of 1mg  of morphine and was given per order around 0318 am. Around 0615 this morning patient begin to have some mild confusion and wondered if her family knew where she was. Reminded patient that she was in the hospital and she agreed. Also notified hospitalist of heart rate up to 120's and sustained for only 1-212min and came back down to the low 100's. Continued to monitored patient per doctor's order. Notified oncoming nurse as well.

## 2013-09-13 NOTE — Clinical Social Work Note (Signed)
CSW informed by RN patient is ready for D/C to SNF bed at [facility]. CSW met with patient's daughter who is agreeable to plans. CSW contacted by Benjamine Mola at [facility] and confirmed patient bed availability. CSW placed d/c packet in patient's shadow chart. RN provided with report and room number. CSW to arrange transport via EMS. No further needs identified. CSW signing off.     [place], [place] Weekend Clinical Social Worker [phone]

## 2013-09-13 NOTE — Progress Notes (Signed)
Discussed discharge with daughter, pt dressed, IV and telemetry removed\. Awaiting PTAR. [place] [facility].

## 2013-09-13 NOTE — Progress Notes (Signed)
Attempted x2 insertion of peripheral IV and was unsuccessful in the right forearm. Paged and notified IV team and will try to insert an IV as well. Will continue to monitor patient to end of shift.

## 2013-09-16 LAB — CULTURE, BLOOD (ROUTINE X 2)
Culture: NO GROWTH
Culture: NO GROWTH

## 2013-09-19 ENCOUNTER — Ambulatory Visit: Payer: Medicare Other | Admitting: Internal Medicine

## 2013-09-23 ENCOUNTER — Telehealth: Payer: Self-pay

## 2013-09-23 NOTE — Telephone Encounter (Signed)
Relevant patient education mailed to patient.  

## 2013-09-25 ENCOUNTER — Encounter (HOSPITAL_COMMUNITY): Payer: Self-pay | Admitting: Emergency Medicine

## 2013-09-25 ENCOUNTER — Emergency Department (HOSPITAL_COMMUNITY)
Admission: EM | Admit: 2013-09-25 | Discharge: 2013-09-26 | Disposition: E | Payer: Medicare Other | Attending: Emergency Medicine | Admitting: Emergency Medicine

## 2013-09-25 DIAGNOSIS — R197 Diarrhea, unspecified: Secondary | ICD-10-CM | POA: Insufficient documentation

## 2013-09-25 DIAGNOSIS — Z8673 Personal history of transient ischemic attack (TIA), and cerebral infarction without residual deficits: Secondary | ICD-10-CM | POA: Insufficient documentation

## 2013-09-25 DIAGNOSIS — Z8709 Personal history of other diseases of the respiratory system: Secondary | ICD-10-CM | POA: Insufficient documentation

## 2013-09-25 DIAGNOSIS — I4891 Unspecified atrial fibrillation: Secondary | ICD-10-CM | POA: Insufficient documentation

## 2013-09-25 DIAGNOSIS — Z7982 Long term (current) use of aspirin: Secondary | ICD-10-CM | POA: Insufficient documentation

## 2013-09-25 DIAGNOSIS — Z8701 Personal history of pneumonia (recurrent): Secondary | ICD-10-CM | POA: Insufficient documentation

## 2013-09-25 DIAGNOSIS — R404 Transient alteration of awareness: Secondary | ICD-10-CM | POA: Insufficient documentation

## 2013-09-25 DIAGNOSIS — K2270 Barrett's esophagus without dysplasia: Secondary | ICD-10-CM | POA: Insufficient documentation

## 2013-09-25 DIAGNOSIS — R4182 Altered mental status, unspecified: Secondary | ICD-10-CM | POA: Insufficient documentation

## 2013-09-25 DIAGNOSIS — R112 Nausea with vomiting, unspecified: Secondary | ICD-10-CM | POA: Insufficient documentation

## 2013-09-25 DIAGNOSIS — M1990 Unspecified osteoarthritis, unspecified site: Secondary | ICD-10-CM | POA: Insufficient documentation

## 2013-09-25 DIAGNOSIS — Z792 Long term (current) use of antibiotics: Secondary | ICD-10-CM | POA: Insufficient documentation

## 2013-09-25 DIAGNOSIS — G43909 Migraine, unspecified, not intractable, without status migrainosus: Secondary | ICD-10-CM | POA: Insufficient documentation

## 2013-09-25 DIAGNOSIS — Z79899 Other long term (current) drug therapy: Secondary | ICD-10-CM | POA: Insufficient documentation

## 2013-09-25 DIAGNOSIS — I469 Cardiac arrest, cause unspecified: Secondary | ICD-10-CM | POA: Insufficient documentation

## 2013-09-25 DIAGNOSIS — K219 Gastro-esophageal reflux disease without esophagitis: Secondary | ICD-10-CM | POA: Insufficient documentation

## 2013-09-25 DIAGNOSIS — R609 Edema, unspecified: Secondary | ICD-10-CM | POA: Insufficient documentation

## 2013-09-25 DIAGNOSIS — M949 Disorder of cartilage, unspecified: Secondary | ICD-10-CM

## 2013-09-25 DIAGNOSIS — Z7902 Long term (current) use of antithrombotics/antiplatelets: Secondary | ICD-10-CM | POA: Insufficient documentation

## 2013-09-25 DIAGNOSIS — I10 Essential (primary) hypertension: Secondary | ICD-10-CM | POA: Insufficient documentation

## 2013-09-25 DIAGNOSIS — M899 Disorder of bone, unspecified: Secondary | ICD-10-CM | POA: Insufficient documentation

## 2013-09-26 NOTE — ED Notes (Signed)
Pt not eligible for donation per [place] [employer] due to age.

## 2013-09-26 NOTE — ED Notes (Signed)
Time of death called by Dr. Patria Maneampos at [phone].

## 2013-09-26 NOTE — ED Provider Notes (Signed)
CSN: [id number]     Arrival date & time 2013-12-14  0022 History   First MD Initiated Contact with Patient 02015-07-19 0028     No chief complaint on file.    (Consider location/radiation/quality/duration/timing/severity/associated sxs/prior Treatment) The history is provided by the EMS personnel. History limited by: level V caveat: unresponsive.  pt brought from nursing home with nausea, vomiting, diarrhea. Reported as black. Initially responsive for EMS. Became unresponsive in route. DNR. No coumadin use  Past Medical History  Diagnosis Date  . Hypertension   . Osteopenia   . Atrial fibrillation     not coumadin candidate  . Impaired gait   . Gait abnormality     imbalance  . Urinary incontinence   . Diarrhea     chronic  . Barrett's esophagus   . Edema leg     chronic, LEFT leg, u/s 2009 neg for DVT  . CVA (cerebral infarction) 01/26/2012  . Pneumonia     "several times"  . Chronic bronchitis     "get it q yr since my later years" (09/10/2013)  . GERD (gastroesophageal reflux disease)   . WUJWJXBJ(478.2Headache(784.0)     "often" (09/10/2013)  . Migraine     "quite often; concentrates in my left eye" (09/10/2013)  . Osteoarthritis   . Arthritis     "very bad in my left shoulder" (09/10/2013)   Past Surgical History  Procedure Laterality Date  . Tonsillectomy  1920's  . Hysterectomy-unknown      pt denies this hx on 09/10/2013  . Cholecystectomy      had pancreatitis   No family history on file. History  Substance Use Topics  . Smoking status: Never Smoker   . Smokeless tobacco: Never Used  . Alcohol Use: No   OB History   Grav Para Term Preterm Abortions TAB SAB Ect Mult Living                 Review of Systems  Unable to perform ROS: Mental status change      Allergies  Review of patient's allergies indicates no known allergies.  Home Medications   Prior to Admission medications   Medication Sig Start Date End Date Taking? Authorizing Provider  acetaminophen  (TYLENOL) 500 MG tablet Take 1,000 mg by mouth every 6 (six) hours as needed. For pain    Historical Provider, MD  Artificial Tear Solution (NATURAL BALANCE OP) Place 2 drops into both eyes 3 (three) times daily.    Historical Provider, MD  Aspirin (ASPIR-81 PO) Take 81 mg by mouth daily.    Historical Provider, MD  atorvastatin (LIPITOR) 10 MG tablet Take 1 tablet (10 mg total) by mouth daily at 6 PM. 01/29/12   Ripudeep K Rai, MD  calcium carbonate (OS-CAL) 600 MG TABS Take 600 mg by mouth daily.    Historical Provider, MD  clopidogrel (PLAVIX) 75 MG tablet Take 75 mg by mouth daily.    Historical Provider, MD  diltiazem (CARDIZEM) 60 MG tablet Take 60-120 mg by mouth 3 (three) times daily. Take 2 tablets in AM, 1 tablet in Afternoon and 1 tablet in PM 06/15/12   Vassie Lollarlos Madera, MD  ENSURE PLUS (ENSURE PLUS) LIQD Take 1 Can by mouth daily at 6 (six) AM.    Historical Provider, MD  ferrous sulfate 325 (65 FE) MG tablet Take 325 mg by mouth daily with breakfast.    Historical Provider, MD  Fiber CAPS Take 1 capsule by mouth daily.    Historical  Provider, MD  furosemide (LASIX) 40 MG tablet Take 20 mg by mouth every Monday, Wednesday, and Friday.  06/15/12   Vassie Lollarlos Madera, MD  glucose blood test strip 1 each by Other route as needed for other. Use as instructed    Historical Provider, MD  HYDROcodone-acetaminophen (NORCO/VICODIN) 5-325 MG per tablet Take 0.5 tablets by mouth 3 (three) times daily as needed for pain. 08/19/12   Wanda PlumpJose E Paz, MD  levofloxacin (LEVAQUIN) 750 MG tablet Take 1 tablet (750 mg total) by mouth every other day. 09/12/13   Marinda ElkAbraham Feliz Ortiz, MD  lidocaine (LIDODERM) 5 % Place 1 patch onto the skin daily. Remove & Discard patch within 12 hours or as directed by MD    Historical Provider, MD  pantoprazole (PROTONIX) 40 MG tablet Take 1 tablet (40 mg total) by mouth daily at 12 noon. 06/15/12   Vassie Lollarlos Madera, MD   There were no vitals taken for this visit. Physical Exam  Nursing note  and vitals reviewed. Constitutional: No distress.  HENT:  Head: Normocephalic and atraumatic.  Cardiovascular: Normal rate.   Pulmonary/Chest:  Agonal breathing  Abdominal: Soft. She exhibits no distension.  Neurological:  gcs3  Skin: Skin is warm.    ED Course  Procedures (including critical care time) Labs Review Labs Reviewed  CBC WITH DIFFERENTIAL  COMPREHENSIVE METABOLIC PANEL  TROPONIN I     EMERGENCY DEPARTMENT US CARDIAC EXAM "Study: Limited Ultrasound of the heart and pericardium" INDICATIONS:Hypotension Multiple views of the heart and pericardium are obtained with a multi-frequency probe. PERFORMED ZO:XWRUEABY:Myself IMAGES ARCHIVED?: Yes FINDINGS: No pericardial effusion and Decreased contractility LIMITATIONS:  Emergent procedure VIEWS USED: Subcostal 4 chamber INTERPRETATION: Cardiac activity present and Pericardial effusioin absent COMMENT:        Imaging Review No results found.   EKG Interpretation None      MDM   Final diagnoses:  None    Arrived initially with pulse and BP of 60 systolic. Unresponsive on arrival. Developed bradycardia in ER and ulitimately passed in ER. Time of death 1241AM on Oct 19, 2013. Not a ME case. Will attempt to contact family and update.     Lyanne CoKevin M Johneisha Broaden, MD 0May 24, 2015 [phone]

## 2013-09-26 NOTE — ED Notes (Signed)
Pt from [facility] with initial c/o nausea and vomiting and tarry stools.  On EMS arrival, pt with agonal breathing and unresponsive.  Pt placed on NRB.  O2 sats continued to drop en route.  Pt is a DNR.  On arrival, pt continued to be unresponsive with agonal breathing.  EDP at bedside.  Time of death called at 1241.

## 2013-09-26 NOTE — ED Notes (Signed)
Belongings given to pt's family member IAC/InterActiveCorpSherrill Haney.  Family member given pink gown and watch.  Emotional support given to family.

## 2013-09-26 DEATH — deceased

## 2013-09-29 ENCOUNTER — Ambulatory Visit: Payer: Medicare Other | Admitting: Internal Medicine

## 2013-09-29 ENCOUNTER — Telehealth: Payer: Self-pay

## 2013-09-29 NOTE — Telephone Encounter (Signed)
Death certificate received on 09/26/13 via mail.  Death Certificate was given to Sammuel CooperGaye Foster, RN for physician to complete and sign.

## 2013-09-29 NOTE — Telephone Encounter (Signed)
[facility]ed [facility] at [phone] ext [phone] to make them aware that the Certificate of Death is ready for pick up.  Stated they would pick it up tomorrow morning.

## 2013-10-02 NOTE — Telephone Encounter (Signed)
New certificate completed. [facility] notified. Placed in the pick up box.

## 2013-10-02 NOTE — Telephone Encounter (Signed)
Certificate picked up by [facility].

## 2013-10-02 NOTE — Telephone Encounter (Signed)
Original certificate had incorrect cause of death listed.   New certificate received and placed in Dr. Leta JunglingPaz's RED quick signature folder for completion.  PENDING.

## 2013-10-02 NOTE — Telephone Encounter (Signed)
[facility] called to check on the status of the death certificate that was dropped off yesterday. Please advise.    [facility] at [phone] ext [phone]

## 2013-10-18 IMAGING — CR DG CHEST 2V
2 series · 2 of 2 positions shown · non-contrast
Comparison: 06/13/2012

CLINICAL DATA: Pneumonia.  Pleural effusions.  Cough.

CHEST - 2 VIEW

[w chest pa]
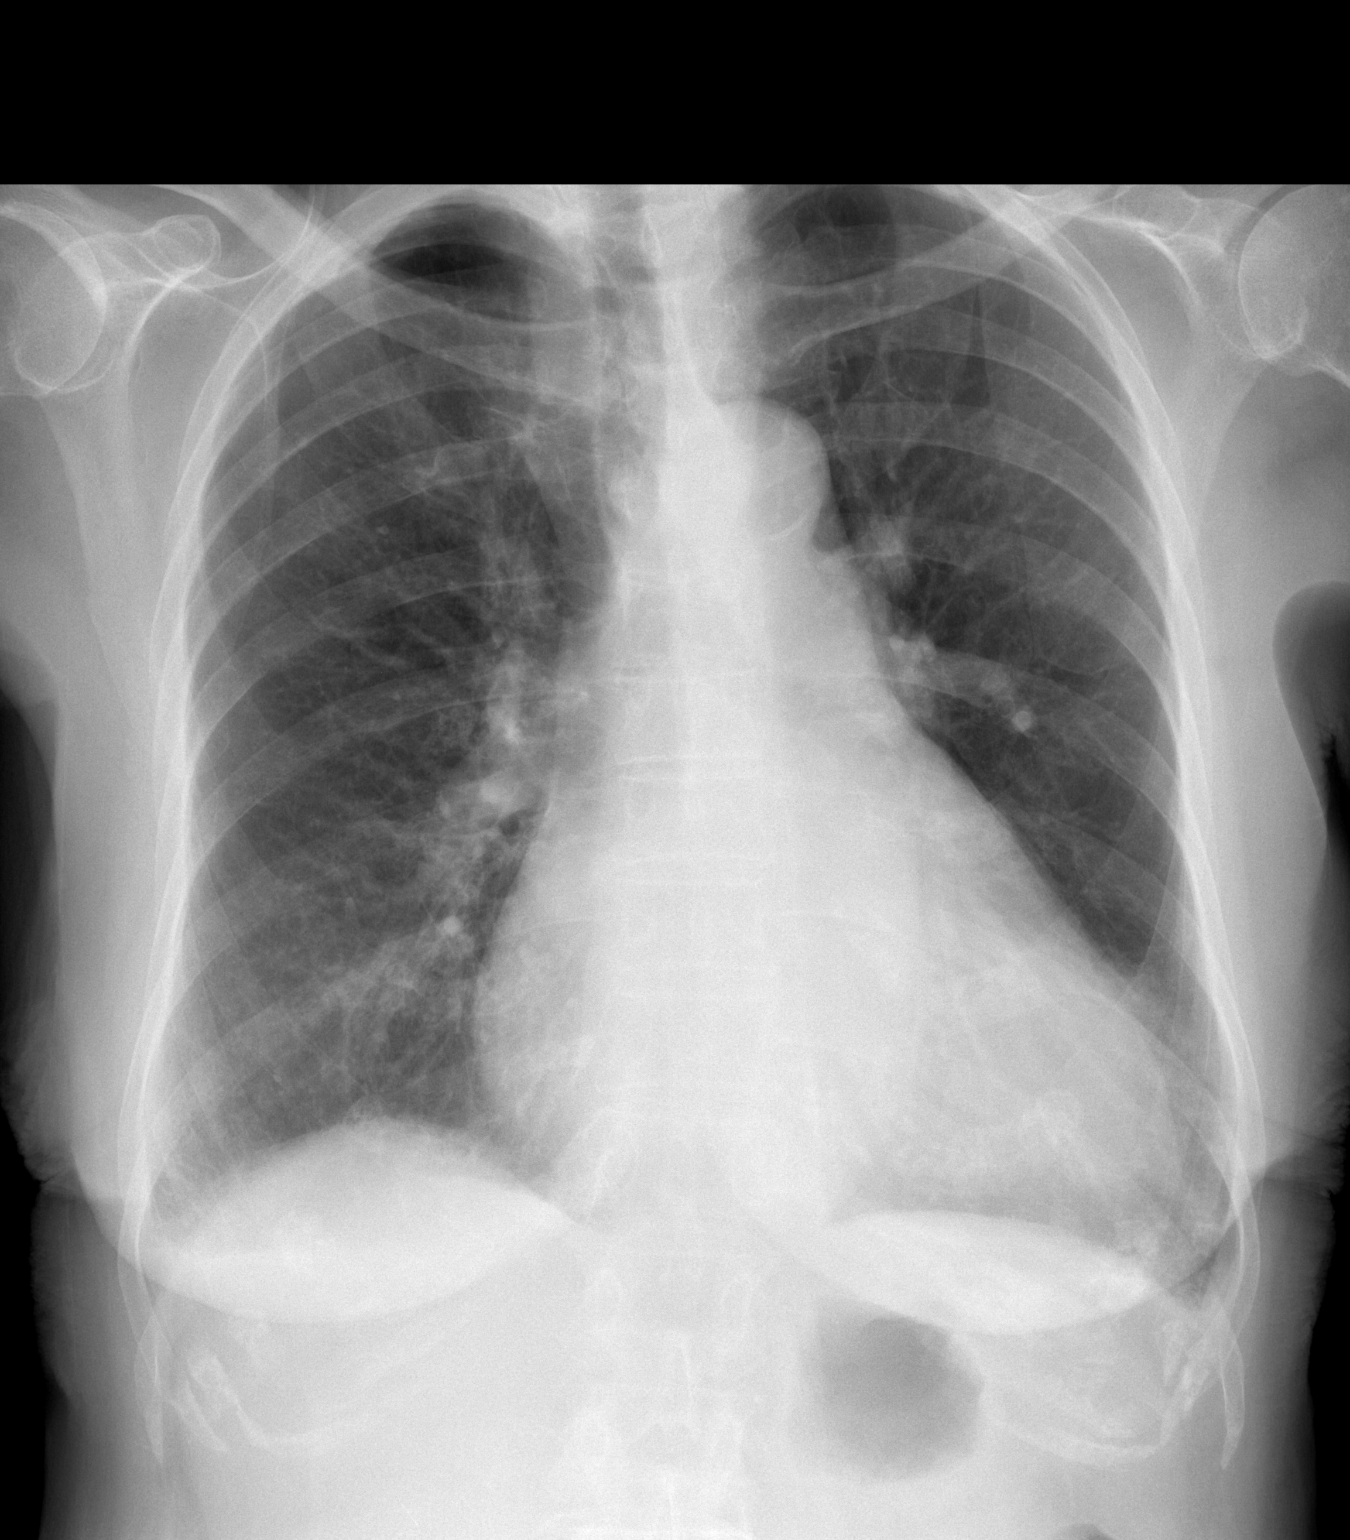

[w chest lat]
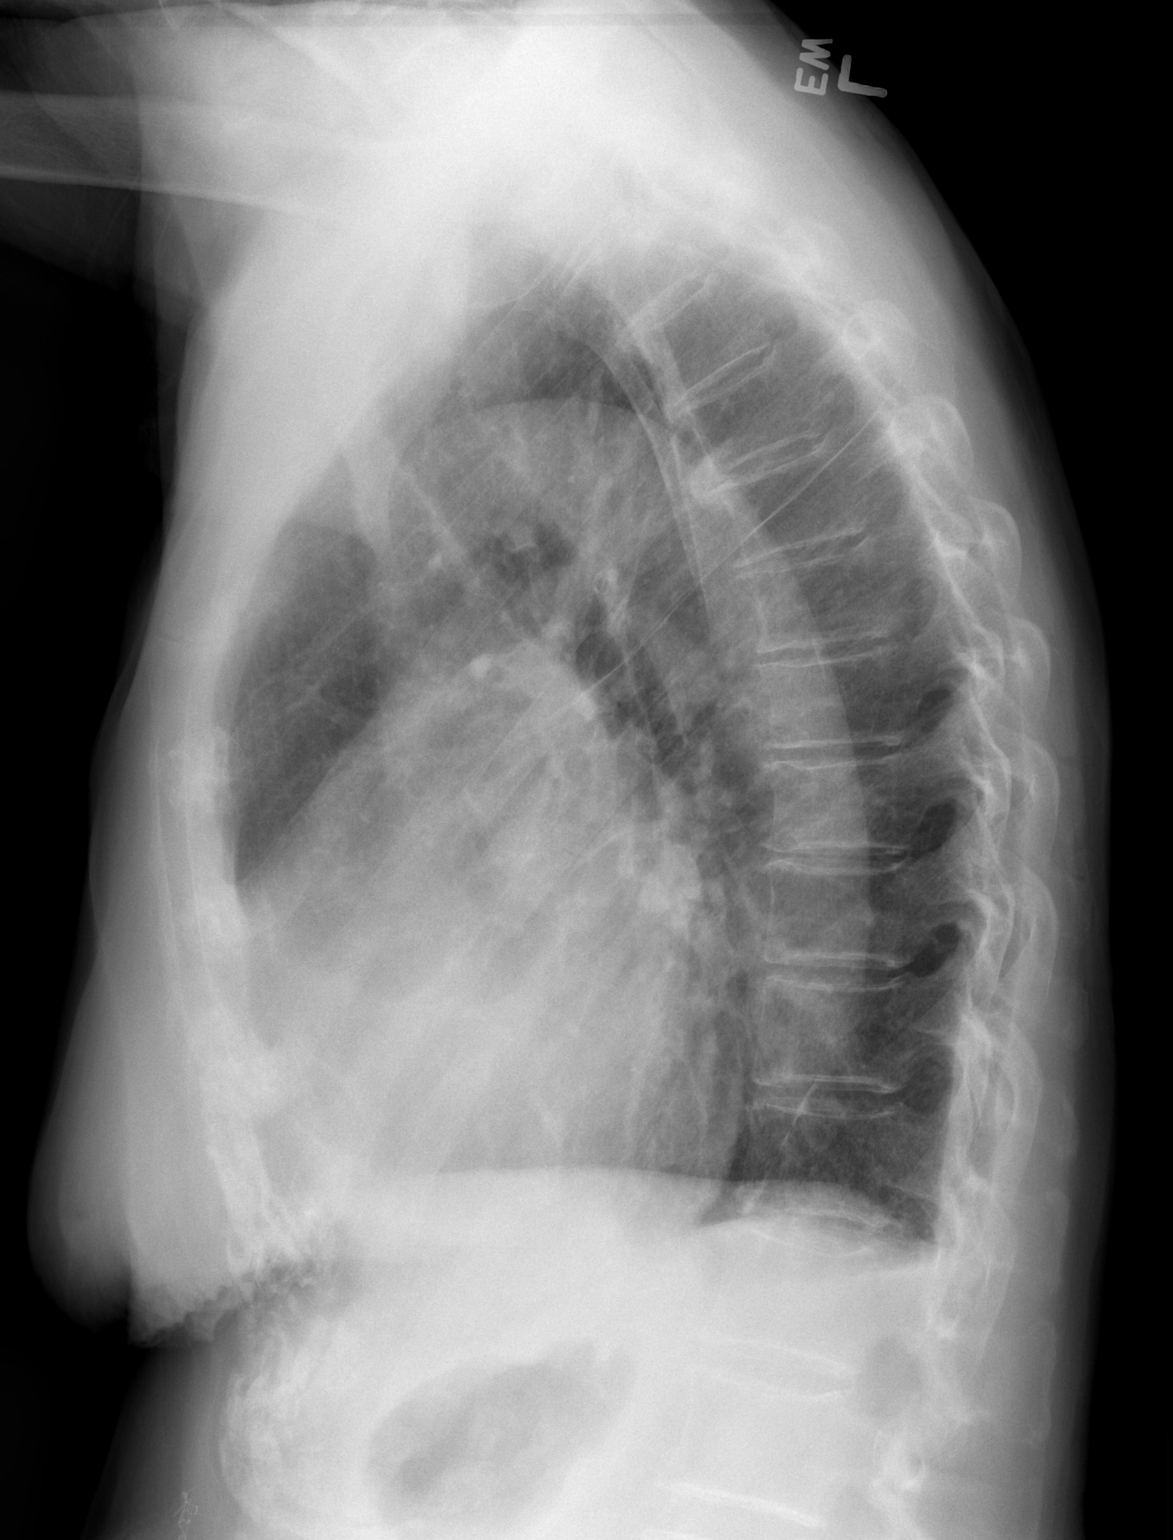

[2 of 2 positions shown; findings below may reference images not displayed]

FINDINGS: The bilateral pulmonary infiltrates have resolved.  There
is chronic cardiomegaly.  Pulmonary vascularity is normal.  Lungs
are hyperinflated suggesting emphysema.  No effusions.  No acute
osseous abnormality.
IMPRESSION: Complete resolution of the bilateral pulmonary infiltrates.
Probable emphysema.
